# Patient Record
Sex: Male | Born: 1945
Health system: Southern US, Community
[De-identification: ages and names within clinical notes are randomized; demographics above are authoritative.]

## PROBLEM LIST (undated history)

## (undated) DIAGNOSIS — F1020 Alcohol dependence, uncomplicated: Secondary | ICD-10-CM

## (undated) DIAGNOSIS — H269 Unspecified cataract: Secondary | ICD-10-CM

## (undated) DIAGNOSIS — R809 Proteinuria, unspecified: Secondary | ICD-10-CM

## (undated) DIAGNOSIS — I10 Essential (primary) hypertension: Secondary | ICD-10-CM

## (undated) DIAGNOSIS — C61 Malignant neoplasm of prostate: Secondary | ICD-10-CM

## (undated) DIAGNOSIS — E119 Type 2 diabetes mellitus without complications: Secondary | ICD-10-CM

## (undated) DIAGNOSIS — M1990 Unspecified osteoarthritis, unspecified site: Secondary | ICD-10-CM

## (undated) DIAGNOSIS — K219 Gastro-esophageal reflux disease without esophagitis: Secondary | ICD-10-CM

## (undated) DIAGNOSIS — T7840XA Allergy, unspecified, initial encounter: Secondary | ICD-10-CM

## (undated) DIAGNOSIS — N529 Male erectile dysfunction, unspecified: Secondary | ICD-10-CM

## (undated) DIAGNOSIS — IMO0001 Reserved for inherently not codable concepts without codable children: Secondary | ICD-10-CM

## (undated) DIAGNOSIS — F4310 Post-traumatic stress disorder, unspecified: Secondary | ICD-10-CM

## (undated) DIAGNOSIS — Z86010 Personal history of colonic polyps: Secondary | ICD-10-CM

## (undated) DIAGNOSIS — Z8719 Personal history of other diseases of the digestive system: Secondary | ICD-10-CM

## (undated) DIAGNOSIS — Z9889 Other specified postprocedural states: Secondary | ICD-10-CM

## (undated) DIAGNOSIS — E785 Hyperlipidemia, unspecified: Secondary | ICD-10-CM

## (undated) HISTORY — DX: Unspecified cataract: H26.9

## (undated) HISTORY — DX: Malignant neoplasm of prostate: C61

## (undated) HISTORY — DX: Gastro-esophageal reflux disease without esophagitis: K21.9

## (undated) HISTORY — DX: Unspecified osteoarthritis, unspecified site: M19.90

## (undated) HISTORY — DX: Reserved for inherently not codable concepts without codable children: IMO0001

## (undated) HISTORY — DX: Essential (primary) hypertension: I10

## (undated) HISTORY — DX: Allergy, unspecified, initial encounter: T78.40XA

## (undated) HISTORY — DX: Male erectile dysfunction, unspecified: N52.9

## (undated) HISTORY — DX: Hyperlipidemia, unspecified: E78.5

## (undated) HISTORY — DX: Other specified postprocedural states: Z98.890

## (undated) HISTORY — DX: Type 2 diabetes mellitus without complications: E11.9

## (undated) HISTORY — DX: Personal history of colonic polyps: Z86.010

## (undated) HISTORY — DX: Alcohol dependence, uncomplicated: F10.20

## (undated) HISTORY — DX: Post-traumatic stress disorder, unspecified: F43.10

## (undated) HISTORY — DX: Proteinuria, unspecified: R80.9

## (undated) HISTORY — PX: OTHER SURGICAL HISTORY: SHX169

## (undated) HISTORY — DX: Personal history of other diseases of the digestive system: Z87.19

---

## 1999-02-27 ENCOUNTER — Emergency Department (HOSPITAL_COMMUNITY): Admission: EM | Admit: 1999-02-27 | Discharge: 1999-02-27 | Payer: Self-pay | Admitting: Emergency Medicine

## 1999-11-21 ENCOUNTER — Emergency Department (HOSPITAL_COMMUNITY): Admission: EM | Admit: 1999-11-21 | Discharge: 1999-11-21 | Payer: Self-pay | Admitting: Emergency Medicine

## 1999-11-21 ENCOUNTER — Encounter: Payer: Self-pay | Admitting: Emergency Medicine

## 2000-07-08 ENCOUNTER — Emergency Department (HOSPITAL_COMMUNITY): Admission: EM | Admit: 2000-07-08 | Discharge: 2000-07-08 | Payer: Self-pay | Admitting: Emergency Medicine

## 2001-04-27 ENCOUNTER — Emergency Department (HOSPITAL_COMMUNITY): Admission: EM | Admit: 2001-04-27 | Discharge: 2001-04-27 | Payer: Self-pay | Admitting: Emergency Medicine

## 2001-06-29 ENCOUNTER — Encounter: Admission: RE | Admit: 2001-06-29 | Discharge: 2001-07-13 | Payer: Self-pay | Admitting: Occupational Medicine

## 2001-12-15 ENCOUNTER — Encounter: Admission: RE | Admit: 2001-12-15 | Discharge: 2001-12-15 | Payer: Self-pay | Admitting: Neurosurgery

## 2001-12-15 ENCOUNTER — Encounter: Payer: Self-pay | Admitting: Neurosurgery

## 2001-12-29 ENCOUNTER — Encounter: Admission: RE | Admit: 2001-12-29 | Discharge: 2001-12-29 | Payer: Self-pay | Admitting: Neurosurgery

## 2001-12-29 ENCOUNTER — Encounter: Payer: Self-pay | Admitting: Neurosurgery

## 2002-01-27 ENCOUNTER — Ambulatory Visit (HOSPITAL_COMMUNITY): Admission: RE | Admit: 2002-01-27 | Discharge: 2002-01-27 | Payer: Self-pay | Admitting: Gastroenterology

## 2002-02-08 ENCOUNTER — Encounter: Payer: Self-pay | Admitting: Neurosurgery

## 2002-07-26 ENCOUNTER — Encounter: Payer: Self-pay | Admitting: Neurosurgery

## 2002-07-26 ENCOUNTER — Ambulatory Visit (HOSPITAL_COMMUNITY): Admission: RE | Admit: 2002-07-26 | Discharge: 2002-07-26 | Payer: Self-pay | Admitting: Neurosurgery

## 2003-11-27 ENCOUNTER — Emergency Department (HOSPITAL_COMMUNITY): Admission: EM | Admit: 2003-11-27 | Discharge: 2003-11-27 | Payer: Self-pay | Admitting: *Deleted

## 2004-05-03 ENCOUNTER — Ambulatory Visit: Payer: Self-pay | Admitting: Internal Medicine

## 2004-06-26 ENCOUNTER — Ambulatory Visit: Payer: Self-pay | Admitting: Internal Medicine

## 2004-10-31 ENCOUNTER — Emergency Department (HOSPITAL_COMMUNITY): Admission: EM | Admit: 2004-10-31 | Discharge: 2004-10-31 | Payer: Self-pay | Admitting: *Deleted

## 2004-12-19 ENCOUNTER — Ambulatory Visit: Payer: Self-pay | Admitting: Internal Medicine

## 2005-04-04 ENCOUNTER — Ambulatory Visit: Payer: Self-pay | Admitting: Internal Medicine

## 2005-04-18 ENCOUNTER — Ambulatory Visit: Payer: Self-pay | Admitting: Internal Medicine

## 2005-08-06 ENCOUNTER — Ambulatory Visit: Payer: Self-pay | Admitting: Internal Medicine

## 2005-09-06 ENCOUNTER — Ambulatory Visit: Payer: Self-pay | Admitting: Internal Medicine

## 2005-10-07 ENCOUNTER — Emergency Department (HOSPITAL_COMMUNITY): Admission: EM | Admit: 2005-10-07 | Discharge: 2005-10-07 | Payer: Self-pay | Admitting: Emergency Medicine

## 2005-11-21 ENCOUNTER — Ambulatory Visit: Payer: Self-pay | Admitting: Internal Medicine

## 2006-07-15 ENCOUNTER — Ambulatory Visit: Payer: Self-pay | Admitting: Internal Medicine

## 2006-07-15 LAB — CONVERTED CEMR LAB
AST: 44 units/L — ABNORMAL HIGH (ref 0–37)
Albumin: 3.9 g/dL (ref 3.5–5.2)
Alkaline Phosphatase: 47 units/L (ref 39–117)
Bilirubin, Direct: 0.2 mg/dL (ref 0.0–0.3)
CO2: 25 meq/L (ref 19–32)
Chloride: 101 meq/L (ref 96–112)
Eosinophils Absolute: 0 10*3/uL (ref 0.0–0.6)
GFR calc Af Amer: 79 mL/min
GFR calc non Af Amer: 66 mL/min
HCT: 42.4 % (ref 39.0–52.0)
Lymphocytes Relative: 16.8 % (ref 12.0–46.0)
MCV: 89.2 fL (ref 78.0–100.0)
Monocytes Absolute: 0.8 10*3/uL — ABNORMAL HIGH (ref 0.2–0.7)
TSH: 1.29 microintl units/mL (ref 0.35–5.50)
Total Bilirubin: 1 mg/dL (ref 0.3–1.2)
Total CHOL/HDL Ratio: 4.8
Triglycerides: 277 mg/dL (ref 0–149)
VLDL: 55 mg/dL — ABNORMAL HIGH (ref 0–40)

## 2006-09-26 ENCOUNTER — Inpatient Hospital Stay (HOSPITAL_COMMUNITY): Admission: RE | Admit: 2006-09-26 | Discharge: 2006-09-30 | Payer: Self-pay | Admitting: Neurosurgery

## 2006-10-14 ENCOUNTER — Encounter: Payer: Self-pay | Admitting: Internal Medicine

## 2006-10-21 ENCOUNTER — Ambulatory Visit: Payer: Self-pay | Admitting: Internal Medicine

## 2006-10-21 LAB — CONVERTED CEMR LAB
ALT: 21 units/L (ref 0–53)
AST: 28 units/L (ref 0–37)
Albumin: 3.3 g/dL — ABNORMAL LOW (ref 3.5–5.2)
Cholesterol: 164 mg/dL (ref 0–200)
Creatinine,U: 647.2 mg/dL
LDL Cholesterol: 96 mg/dL (ref 0–99)
Microalb Creat Ratio: 154.8 mg/g — ABNORMAL HIGH (ref 0.0–30.0)
Microalb, Ur: 100.2 mg/dL — ABNORMAL HIGH (ref 0.0–1.9)
Total Protein: 6.6 g/dL (ref 6.0–8.3)

## 2006-10-22 ENCOUNTER — Encounter: Payer: Self-pay | Admitting: Internal Medicine

## 2006-10-22 DIAGNOSIS — I10 Essential (primary) hypertension: Secondary | ICD-10-CM

## 2006-10-22 DIAGNOSIS — IMO0002 Reserved for concepts with insufficient information to code with codable children: Secondary | ICD-10-CM | POA: Insufficient documentation

## 2006-10-22 DIAGNOSIS — G4700 Insomnia, unspecified: Secondary | ICD-10-CM | POA: Insufficient documentation

## 2006-10-22 DIAGNOSIS — F528 Other sexual dysfunction not due to a substance or known physiological condition: Secondary | ICD-10-CM

## 2006-10-22 DIAGNOSIS — E785 Hyperlipidemia, unspecified: Secondary | ICD-10-CM | POA: Insufficient documentation

## 2006-11-19 ENCOUNTER — Ambulatory Visit: Payer: Self-pay | Admitting: Internal Medicine

## 2006-11-19 DIAGNOSIS — R809 Proteinuria, unspecified: Secondary | ICD-10-CM | POA: Insufficient documentation

## 2006-11-25 LAB — CONVERTED CEMR LAB
Creatinine,U: 180.6 mg/dL
Microalb Creat Ratio: 369.3 mg/g — ABNORMAL HIGH (ref 0.0–30.0)
Microalb, Ur: 66.7 mg/dL — ABNORMAL HIGH (ref 0.0–1.9)

## 2006-12-16 ENCOUNTER — Encounter: Payer: Self-pay | Admitting: Internal Medicine

## 2007-01-07 ENCOUNTER — Telehealth: Payer: Self-pay | Admitting: Internal Medicine

## 2007-02-16 ENCOUNTER — Ambulatory Visit: Payer: Self-pay | Admitting: Internal Medicine

## 2007-02-18 LAB — CONVERTED CEMR LAB
BUN: 12 mg/dL (ref 6–23)
Chloride: 104 meq/L (ref 96–112)
Creatinine, Ser: 1 mg/dL (ref 0.4–1.5)
GFR calc non Af Amer: 81 mL/min
Glucose, Bld: 84 mg/dL (ref 70–99)
Microalb Creat Ratio: 306.8 mg/g — ABNORMAL HIGH (ref 0.0–30.0)
Microalb, Ur: 20.4 mg/dL — ABNORMAL HIGH (ref 0.0–1.9)

## 2007-02-23 ENCOUNTER — Telehealth: Payer: Self-pay | Admitting: Internal Medicine

## 2007-02-24 ENCOUNTER — Encounter: Payer: Self-pay | Admitting: Internal Medicine

## 2007-03-17 ENCOUNTER — Encounter: Payer: Self-pay | Admitting: Internal Medicine

## 2007-03-18 ENCOUNTER — Telehealth: Payer: Self-pay | Admitting: Internal Medicine

## 2007-06-01 ENCOUNTER — Ambulatory Visit: Payer: Self-pay | Admitting: Internal Medicine

## 2007-06-01 ENCOUNTER — Encounter: Payer: Self-pay | Admitting: Internal Medicine

## 2007-06-03 ENCOUNTER — Telehealth: Payer: Self-pay | Admitting: Internal Medicine

## 2007-06-22 ENCOUNTER — Encounter: Payer: Self-pay | Admitting: Internal Medicine

## 2007-06-29 ENCOUNTER — Ambulatory Visit: Payer: Self-pay | Admitting: Internal Medicine

## 2007-08-07 ENCOUNTER — Telehealth: Payer: Self-pay | Admitting: Internal Medicine

## 2007-08-19 ENCOUNTER — Telehealth: Payer: Self-pay | Admitting: Internal Medicine

## 2007-08-28 ENCOUNTER — Ambulatory Visit: Payer: Self-pay | Admitting: Internal Medicine

## 2007-09-01 ENCOUNTER — Telehealth: Payer: Self-pay | Admitting: Internal Medicine

## 2007-09-15 ENCOUNTER — Encounter: Payer: Self-pay | Admitting: Internal Medicine

## 2007-10-15 ENCOUNTER — Telehealth: Payer: Self-pay | Admitting: Internal Medicine

## 2007-11-16 ENCOUNTER — Telehealth: Payer: Self-pay | Admitting: Internal Medicine

## 2007-11-25 ENCOUNTER — Telehealth: Payer: Self-pay | Admitting: *Deleted

## 2007-12-22 ENCOUNTER — Encounter: Payer: Self-pay | Admitting: Internal Medicine

## 2007-12-29 ENCOUNTER — Telehealth: Payer: Self-pay | Admitting: Internal Medicine

## 2007-12-29 ENCOUNTER — Encounter: Payer: Self-pay | Admitting: Internal Medicine

## 2008-01-15 ENCOUNTER — Telehealth: Payer: Self-pay | Admitting: Internal Medicine

## 2008-01-20 ENCOUNTER — Telehealth: Payer: Self-pay | Admitting: Internal Medicine

## 2008-01-25 ENCOUNTER — Encounter: Payer: Self-pay | Admitting: Internal Medicine

## 2008-03-09 ENCOUNTER — Telehealth: Payer: Self-pay | Admitting: *Deleted

## 2008-03-15 ENCOUNTER — Ambulatory Visit: Payer: Self-pay | Admitting: Internal Medicine

## 2008-03-15 DIAGNOSIS — R0789 Other chest pain: Secondary | ICD-10-CM | POA: Insufficient documentation

## 2008-03-15 HISTORY — PX: NM MYOVIEW LTD: HXRAD82

## 2008-03-17 ENCOUNTER — Encounter: Payer: Self-pay | Admitting: Internal Medicine

## 2008-03-17 ENCOUNTER — Ambulatory Visit: Payer: Self-pay

## 2008-03-17 LAB — CONVERTED CEMR LAB
Albumin: 3.5 g/dL (ref 3.5–5.2)
BUN: 20 mg/dL (ref 6–23)
Basophils Relative: 0.5 % (ref 0.0–3.0)
Calcium: 9 mg/dL (ref 8.4–10.5)
Chloride: 105 meq/L (ref 96–112)
Creatinine,U: 154.8 mg/dL
HCT: 38.3 % — ABNORMAL LOW (ref 39.0–52.0)
HDL: 43.2 mg/dL (ref >39.0)
MCHC: 34.8 g/dL (ref 30.0–36.0)
MCV: 90.5 fL (ref 78.0–100.0)
Microalb Creat Ratio: 34.2 mg/g — ABNORMAL HIGH (ref 0.0–30.0)
Microalb, Ur: 5.3 mg/dL — ABNORMAL HIGH (ref 0.0–1.9)
Monocytes Absolute: 0.6 10*3/uL (ref 0.1–1.0)
Neutro Abs: 3.6 10*3/uL (ref 1.4–7.7)
Potassium: 4.3 meq/L (ref 3.5–5.1)
RBC: 4.24 M/uL (ref 4.22–5.81)
RDW: 14.8 % — ABNORMAL HIGH (ref 11.5–14.6)
Total CHOL/HDL Ratio: 2.8
Triglycerides: 179 mg/dL — ABNORMAL HIGH (ref 0–149)
WBC: 5.6 10*3/uL (ref 4.5–10.5)

## 2008-05-06 ENCOUNTER — Ambulatory Visit: Payer: Self-pay | Admitting: Internal Medicine

## 2008-05-06 ENCOUNTER — Telehealth: Payer: Self-pay | Admitting: Internal Medicine

## 2008-05-06 DIAGNOSIS — R42 Dizziness and giddiness: Secondary | ICD-10-CM

## 2008-05-06 LAB — CONVERTED CEMR LAB: Hemoglobin: 15.4 g/dL

## 2008-05-09 ENCOUNTER — Telehealth: Payer: Self-pay | Admitting: Internal Medicine

## 2008-05-13 ENCOUNTER — Telehealth: Payer: Self-pay | Admitting: *Deleted

## 2008-05-23 ENCOUNTER — Ambulatory Visit: Payer: Self-pay | Admitting: Internal Medicine

## 2008-05-25 ENCOUNTER — Telehealth: Payer: Self-pay | Admitting: Internal Medicine

## 2008-05-26 ENCOUNTER — Ambulatory Visit: Payer: Self-pay | Admitting: Internal Medicine

## 2008-06-06 ENCOUNTER — Ambulatory Visit: Payer: Self-pay | Admitting: Internal Medicine

## 2008-07-04 ENCOUNTER — Ambulatory Visit: Payer: Self-pay | Admitting: Internal Medicine

## 2008-07-06 ENCOUNTER — Encounter: Payer: Self-pay | Admitting: Internal Medicine

## 2008-08-10 ENCOUNTER — Telehealth: Payer: Self-pay | Admitting: Internal Medicine

## 2008-08-29 ENCOUNTER — Ambulatory Visit: Payer: Self-pay | Admitting: Internal Medicine

## 2008-09-01 LAB — CONVERTED CEMR LAB
ALT: 17 units/L (ref 0–53)
Albumin: 3.6 g/dL (ref 3.5–5.2)
Alkaline Phosphatase: 43 units/L (ref 39–117)
Bilirubin, Direct: 0 mg/dL (ref 0.0–0.3)
Calcium: 9.4 mg/dL (ref 8.4–10.5)
GFR calc non Af Amer: 86.95 mL/min (ref >60)
Glucose, Bld: 96 mg/dL (ref 70–99)
Potassium: 4.2 meq/L (ref 3.5–5.1)
Sodium: 141 meq/L (ref 135–145)
TSH: 1.17 microintl units/mL (ref 0.35–5.50)

## 2008-09-05 ENCOUNTER — Telehealth: Payer: Self-pay | Admitting: Internal Medicine

## 2008-09-28 ENCOUNTER — Telehealth: Payer: Self-pay | Admitting: *Deleted

## 2008-10-31 ENCOUNTER — Ambulatory Visit: Payer: Self-pay | Admitting: Internal Medicine

## 2008-10-31 ENCOUNTER — Telehealth: Payer: Self-pay | Admitting: Internal Medicine

## 2008-10-31 DIAGNOSIS — M6530 Trigger finger, unspecified finger: Secondary | ICD-10-CM | POA: Insufficient documentation

## 2008-12-06 ENCOUNTER — Telehealth (INDEPENDENT_AMBULATORY_CARE_PROVIDER_SITE_OTHER): Payer: Self-pay | Admitting: *Deleted

## 2008-12-06 ENCOUNTER — Telehealth: Payer: Self-pay | Admitting: Internal Medicine

## 2009-02-07 ENCOUNTER — Telehealth: Payer: Self-pay | Admitting: Internal Medicine

## 2009-02-08 ENCOUNTER — Ambulatory Visit: Payer: Self-pay | Admitting: Internal Medicine

## 2009-02-08 LAB — CONVERTED CEMR LAB: Blood Glucose, Fingerstick: 158

## 2009-02-22 ENCOUNTER — Telehealth: Payer: Self-pay | Admitting: *Deleted

## 2009-03-29 ENCOUNTER — Telehealth: Payer: Self-pay | Admitting: Internal Medicine

## 2009-03-29 ENCOUNTER — Emergency Department (HOSPITAL_COMMUNITY): Admission: EM | Admit: 2009-03-29 | Discharge: 2009-03-29 | Payer: Self-pay | Admitting: Emergency Medicine

## 2009-04-11 ENCOUNTER — Telehealth: Payer: Self-pay | Admitting: Internal Medicine

## 2009-05-24 ENCOUNTER — Telehealth: Payer: Self-pay | Admitting: *Deleted

## 2009-05-30 ENCOUNTER — Encounter: Payer: Self-pay | Admitting: Internal Medicine

## 2009-06-28 ENCOUNTER — Encounter: Payer: Self-pay | Admitting: Internal Medicine

## 2009-06-29 ENCOUNTER — Ambulatory Visit: Payer: Self-pay | Admitting: Internal Medicine

## 2009-06-29 DIAGNOSIS — Z87891 Personal history of nicotine dependence: Secondary | ICD-10-CM | POA: Insufficient documentation

## 2009-06-29 DIAGNOSIS — R7309 Other abnormal glucose: Secondary | ICD-10-CM

## 2009-07-05 ENCOUNTER — Encounter: Payer: Self-pay | Admitting: Internal Medicine

## 2009-09-07 ENCOUNTER — Ambulatory Visit: Payer: Self-pay | Admitting: Internal Medicine

## 2009-09-07 LAB — CONVERTED CEMR LAB
AST: 24 units/L (ref 0–37)
Albumin: 3.6 g/dL (ref 3.5–5.2)
Alkaline Phosphatase: 46 units/L (ref 39–117)
Eosinophils Relative: 1.5 % (ref 0.0–5.0)
GFR calc non Af Amer: 99.03 mL/min (ref >60)
Glucose, Bld: 93 mg/dL (ref 70–99)
HDL: 31.8 mg/dL — ABNORMAL LOW (ref >39.00)
LDL Cholesterol: 59 mg/dL (ref 0–99)
Lymphocytes Relative: 33.4 % (ref 12.0–46.0)
Lymphs Abs: 2.2 10*3/uL (ref 0.7–4.0)
Monocytes Absolute: 0.7 10*3/uL (ref 0.1–1.0)
Neutrophils Relative %: 54.2 % (ref 43.0–77.0)
Potassium: 4.5 meq/L (ref 3.5–5.1)
RBC: 4.6 M/uL (ref 4.22–5.81)
Sodium: 143 meq/L (ref 135–145)
Specific Gravity, Urine: >=1.03
TSH: 1.31 microintl units/mL (ref 0.35–5.50)
Total Bilirubin: 0.3 mg/dL (ref 0.3–1.2)
Total CHOL/HDL Ratio: 4
Total Protein: 6.6 g/dL (ref 6.0–8.3)
Triglycerides: 169 mg/dL — ABNORMAL HIGH (ref 0.0–149.0)
VLDL: 33.8 mg/dL (ref 0.0–40.0)
WBC Urine, dipstick: NEGATIVE
WBC: 6.5 10*3/uL (ref 4.5–10.5)

## 2009-09-14 ENCOUNTER — Ambulatory Visit: Payer: Self-pay | Admitting: Internal Medicine

## 2009-09-14 DIAGNOSIS — R82998 Other abnormal findings in urine: Secondary | ICD-10-CM

## 2009-09-14 LAB — CONVERTED CEMR LAB: Creatinine,U: 144.7 mg/dL

## 2009-09-21 ENCOUNTER — Telehealth: Payer: Self-pay | Admitting: Internal Medicine

## 2009-10-26 ENCOUNTER — Encounter: Payer: Self-pay | Admitting: Internal Medicine

## 2009-10-30 ENCOUNTER — Encounter: Payer: Self-pay | Admitting: Internal Medicine

## 2009-10-31 ENCOUNTER — Telehealth: Payer: Self-pay | Admitting: *Deleted

## 2009-11-01 ENCOUNTER — Encounter: Payer: Self-pay | Admitting: *Deleted

## 2009-11-06 ENCOUNTER — Telehealth: Payer: Self-pay | Admitting: *Deleted

## 2009-11-07 ENCOUNTER — Telehealth: Payer: Self-pay | Admitting: *Deleted

## 2009-11-16 ENCOUNTER — Ambulatory Visit: Payer: Self-pay | Admitting: Internal Medicine

## 2009-12-21 ENCOUNTER — Encounter: Payer: Self-pay | Admitting: Internal Medicine

## 2009-12-28 ENCOUNTER — Telehealth: Payer: Self-pay | Admitting: *Deleted

## 2009-12-28 ENCOUNTER — Ambulatory Visit: Payer: Self-pay | Admitting: Internal Medicine

## 2009-12-28 LAB — CONVERTED CEMR LAB
ALT: 22 units/L (ref 0–53)
Albumin ELP: 51.8 % — ABNORMAL LOW (ref 55.8–66.1)
Albumin: 3.7 g/dL (ref 3.5–5.2)
Alpha-1-Globulin: 7.1 % — ABNORMAL HIGH (ref 2.9–4.9)
Alpha-2-Globulin: 13.2 % — ABNORMAL HIGH (ref 7.1–11.8)
Beta Globulin: 5.6 % (ref 4.7–7.2)
Bilirubin, Direct: 0.1 mg/dL (ref 0.0–0.3)
Calcium: 9.2 mg/dL (ref 8.4–10.5)
Cholesterol: 125 mg/dL (ref 0–200)
Creatinine, Ser: 1.1 mg/dL (ref 0.4–1.5)
GFR calc non Af Amer: 89.39 mL/min (ref >60)
Gamma Globulin: 18.3 % (ref 11.1–18.8)
Glucose, Bld: 94 mg/dL (ref 70–99)
Potassium: 4.5 meq/L (ref 3.5–5.1)
Total Bilirubin: 0.5 mg/dL (ref 0.3–1.2)
Total CHOL/HDL Ratio: 4
Total Protein, Serum Electrophoresis: 6.9 g/dL (ref 6.0–8.3)
Total Protein: 6.6 g/dL (ref 6.0–8.3)
VLDL: 23.4 mg/dL (ref 0.0–40.0)

## 2010-01-11 ENCOUNTER — Ambulatory Visit: Payer: Self-pay | Admitting: Internal Medicine

## 2010-01-11 DIAGNOSIS — E669 Obesity, unspecified: Secondary | ICD-10-CM | POA: Insufficient documentation

## 2010-01-11 DIAGNOSIS — M19049 Primary osteoarthritis, unspecified hand: Secondary | ICD-10-CM | POA: Insufficient documentation

## 2010-02-07 ENCOUNTER — Telehealth: Payer: Self-pay | Admitting: Internal Medicine

## 2010-02-13 ENCOUNTER — Telehealth: Payer: Self-pay | Admitting: *Deleted

## 2010-05-15 NOTE — Letter (Signed)
Summary: [facility]   Imported By: Laural Benes 07/07/2009 13:01:20  _____________________________________________________________________  External Attachment:    Type:   Image     Comment:   External Document

## 2010-05-15 NOTE — Progress Notes (Signed)
Summary: refill on zolpidem  Phone Note From Pharmacy   Caller: [facility]. [phone]* Reason for Call: Needs renewal Details for Reason: zolpidem 10mg  Summary of Call: last filled on 02/08/09 #30 Initial call taken by: Sherron Monday, [place] (AAMA),  December 28, 2009 11:07 AM  Follow-up for Phone Call        ok x 1  Follow-up by: [name] MD,  December 28, 2009 11:29 AM  Additional Follow-up for Phone Call Additional follow up Details #1::        Faxed to pharmacy Additional Follow-up by: Sherron Monday, CMA (AAMA),  December 28, 2009 11:31 AM    Prescriptions: ZOLPIDEM TARTRATE 10 MG TABS (ZOLPIDEM TARTRATE) 1 by mouth hs for sleep  #14 x 0   Entered by:   Sherron Monday, CMA (AAMA)   Authorized by:   [name] MD   Signed by:   Sherron Monday, CMA (AAMA) on 12/28/2009   Method used:   Handwritten   [id number]

## 2010-05-15 NOTE — Letter (Signed)
Summary: [facility]   Imported By: Laural Benes 01/10/2010 09:18:11  _____________________________________________________________________  External Attachment:    Type:   Image     Comment:   External Document

## 2010-05-15 NOTE — Letter (Signed)
Summary: [facility]   Imported By: Laural Benes 07/05/2009 13:47:11  _____________________________________________________________________  External Attachment:    Type:   Image     Comment:   External Document

## 2010-05-15 NOTE — Progress Notes (Signed)
Summary: Lipitor follow-up concern  Phone Note Call from Patient Call back at Home Phone [phone]   Caller: Patient Call For: [name] MD Summary of Call: VM from pt in follow-up to his questions about Lipitor and the instructions, he states he never heard back regarding this. Requesting Larene [place] call back Initial call taken by: Nira Conn LPN,  July 25, 624THL [id number] PM  Follow-up for Phone Call        Pt aware and will pick up samples. Follow-up by: Sherron Monday, CMA [name]),  November 06, 2009 3:31 PM

## 2010-05-15 NOTE — Assessment & Plan Note (Signed)
Summary: FU AFTER [facility]ISIT & DR PETERSON WORKUP THAT SHOWED PROTEIN IN...   Vital Signs:  Patient profile:   65 year old male Weight:      225.5 pounds Pulse rate:   60 / minute BP sitting:   120 / 74  (left arm) Cuff size:   large  Vitals Entered By: Sherron Monday, CMA (AAMA) (November 16, 2009 10:03 AM)  CC: Follow-up visit , Hypertension Management   History of Present Illness: Jeremy Black comes in today  for follow up of multiple medical problems . since last visit we have change his lipid med because of potential of  for drug IA with simva   but prob no se . Also  proteinuria ::   saw urology instead of renal   referral  but   see note and no  structural disease .   Marland Kitchen   no edema and feels well otherwise . 24 hour urine for protein was  1.5 gram range  Only taking meloxicam ocassionally .   for back joint pain.  Bp: running diastolic 80...and systolic [id number] + [id number]  range at times .   Psych  not seeing because  but doing better .    Goes to [place] as needed.  still tobacco and alcohol free.   plays golf . No cv pulm restrictions.   Hypertension History:      He denies headache, chest pain, palpitations, dyspnea with exertion, orthopnea, PND, peripheral edema, visual symptoms, neurologic problems, syncope, and side effects from treatment.  He notes no problems with any antihypertensive medication side effects.        Positive major cardiovascular risk factors include male age 36 years old or older, hyperlipidemia, and hypertension.  Negative major cardiovascular risk factors include no history of diabetes and non-tobacco-user status.     Preventive Screening-Counseling & Management  Alcohol-Tobacco     Alcohol drinks/day: 0     Smoking Status: quit     Packs/Day: 1pack every 7 days     Year Quit: 2008     Tobacco Counseling: not to resume use of tobacco products  Caffeine-Diet-Exercise     Caffeine use/day: 1     Does Patient Exercise: yes     Type of exercise: golf,  aerobic     Exercise (avg: min/session): 30-60     Times/week: 2  Current Medications (verified): 1)  Triamterene-Hctz 37.5-25 Mg Tabs (Triamterene-Hctz) .... Take 1 Tablet By Mouth Once A Day 2)  Verapamil Hcl Cr 240 Mg Tbcr (Verapamil Hcl) .... Take 1 Tablet By Mouth Once A Day 3)  Baby Aspirin 81 Mg  Chew (Aspirin) 4)  Centrum Silver   Tabs (Multiple Vitamins-Minerals) 5)  Cialis 20 Mg  Tabs (Tadalafil) .Marland Kitchen.. 1 By Mouth As Needed 6)  Garlic   Powd (Garlic) 7)  Losartan Potassium 100 Mg Tabs (Losartan Potassium) .... Take 1 Tablet By Mouth Once A Day 8)  Mobic 15 Mg Tabs (Meloxicam) .Marland Kitchen.. 1 By Mouth Once Daily 9)  Bystolic 10 Mg Tabs (Nebivolol Hcl) .... Take Daily As Directed- 1/2 Tab- Out For A Few Days 10)  Zolpidem Tartrate 10 Mg Tabs (Zolpidem Tartrate) .Marland Kitchen.. 1 By Mouth Hs For Sleep 11)  Lipitor 20 Mg Tabs (Atorvastatin Calcium) .Marland Kitchen.. 1 By Mouth Once Daily  Allergies (verified): 1)  Codeine Phosphate (Codeine Phosphate)  Past History:  Past medical, surgical, family and social histories (including risk factors) reviewed, and no changes noted (except as noted below).  Past  Medical History: Hyperlipidemia Hypertension ED review of past record showed 500 mg protein excretion per 24 hours on a 24 hour urine  felt to be from hypertension COlon ? 2003  Myoview 12/09 neg except for HT ( all meds held) Td ? utd  Tx for PTSD  [facility]  [place] experience  [place]      Past Surgical History: Reviewed history from 06/01/2007 and no changes required. LS Spinal Surgery x2   Past History:  Care Management: [facility]surgery: [facility] Orthopedics: In past on [facility] of name  Family History: Reviewed history from 02/08/2009 and no changes required. CAD sister Father: dead form cancer  throat    65 years ago  Mother:   DM   HBP  nerves   Siblings: SIs   dm   diet controlled  ,  heart   .   Brother HBP        Social History: Reviewed  history from 09/14/2009 and no changes required. 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         right groin pain poss from  back.     ocassional  Physical Exam  General:  Well-developed,well-nourished,in no acute distress; alert,appropriate and cooperative throughout examinationhealthy-appearing and good hygiene.   Head:  normocephalic and atraumatic.   Neck:  No deformities, masses, or tenderness noted. Lungs:  normal respiratory effort and no intercostal retractions.   Heart:  normal rate, regular rhythm, no murmur, no JVD, and no lifts.   Pulses:  pulses intact without delay   Extremities:  no clubbing cyanosis or edema  Neurologic:  non focal  Skin:  turgor normal and color normal.   Cervical Nodes:  No lymphadenopathy noted Psych:  Oriented X3, normally interactive, good eye contact, not anxious appearing, and not depressed appearing.     Impression & Recommendations:  Problem # 1:  PROTEINURIA (ICD-791.0)  evaluate for intrinsic renal disease   and disc BP controll and other  poss interventions.    creatinine had been stable in the 1.0 range .  will refer to nephrology. as Advised recently .   disc with patient.  Orders: Nephrology Referral (Nephro)  Problem # 2:  HYPERTENSION (ICD-401.9)  somewhat up     will try  micardis instead of losaartan to see if get to 130 range   in the meant time . up at home in 140 range    His updated medication list for this problem includes:    Triamterene-hctz 37.5-25 Mg Tabs (Triamterene-hctz) .Marland Kitchen... Take 1 tablet by mouth once a  day    Verapamil Hcl Cr 240 Mg Tbcr (Verapamil hcl) .Marland Kitchen... Take 1 tablet by mouth once a day    Losartan Potassium 100 Mg Tabs (Losartan potassium) .Marland Kitchen... Take 1 tablet by mouth once a day    Bystolic 10 Mg Tabs (Nebivolol hcl) .Marland Kitchen... Take daily as directed- 1/2 tab- out for a few days    Micardis 80 Mg Tabs (Telmisartan) .Marland Kitchen... 1 by mouth once daily  BP today: 120/74 Prior BP: 140/80 (09/14/2009)  10 Yr Risk Heart Disease: 9 %  Prior 10 Yr Risk Heart Disease: 14 % (09/14/2009)  Labs Reviewed: K+: 4.5 (09/07/2009) Creat: : 1.0 (09/07/2009)   Chol: 125 (09/07/2009)   HDL: 31.80 (09/07/2009)   LDL: 59 (09/07/2009)   TG: 169.0 (09/07/2009)  Orders: Nephrology Referral (Nephro)  Problem # 3:  HYPERLIPIDEMIA (ICD-272.4)  change meds because of drug interactions  and   will recheck in   4-6 weeks no se of med  The following medications were removed from the medication list:    Simvastatin 40 Mg Tabs (Simvastatin) .Marland Kitchen... 1 by mouth once daily His updated medication list for this problem includes:    Lipitor 20 Mg Tabs (Atorvastatin calcium) .Marland Kitchen... 1 by mouth once daily  Labs Reviewed: SGOT: 24 (09/07/2009)   SGPT: 21 (09/07/2009)  10 Yr Risk Heart Disease: 9 % Prior 10 Yr Risk Heart Disease: 14 % (09/14/2009)   HDL:31.80 (09/07/2009), 43.2 (03/15/2008)  LDL:59 (09/07/2009), 40 (03/15/2008)  Chol:125 (09/07/2009), 119 (03/15/2008)  Trig:169.0 (09/07/2009), 179 (03/15/2008)  Problem # 4:  ERECTILE DYSFUNCTION (ICD-302.72) samples given  His updated medication list for this problem includes:    Cialis 20 Mg Tabs (Tadalafil) .Marland Kitchen... 1 by mouth as needed  Problem # 5:  TOBACCO USE, QUIT (ICD-V15.82) continue tobacco free  doing well   Complete Medication List: 1)  Triamterene-hctz 37.5-25 Mg Tabs (Triamterene-hctz) .... Take 1 tablet by mouth once a day 2)  Verapamil Hcl Cr 240 Mg Tbcr (Verapamil hcl) .... Take 1 tablet by mouth once a day 3)  Baby Aspirin 81 Mg Chew (Aspirin) 4)  Centrum  Silver Tabs (Multiple vitamins-minerals) 5)  Cialis 20 Mg Tabs (Tadalafil) .Marland Kitchen.. 1 by mouth as needed 6)  Garlic Powd (Garlic) 7)  Losartan Potassium 100 Mg Tabs (Losartan potassium) .... Take 1 tablet by mouth once a day 8)  Mobic 15 Mg Tabs (Meloxicam) .Marland Kitchen.. 1 by mouth once daily 9)  Bystolic 10 Mg Tabs (Nebivolol hcl) .... Take daily as directed- 1/2 tab- out for a few days 10)  Zolpidem Tartrate 10 Mg Tabs (Zolpidem tartrate) .Marland Kitchen.. 1 by mouth hs for sleep 11)  Lipitor 20 Mg Tabs (Atorvastatin calcium) .Marland Kitchen.. 1 by mouth once daily 12)  Micardis 80 Mg Tabs (Telmisartan) .Marland Kitchen.. 1 by mouth once daily  Hypertension Assessment/Plan:      The patient's hypertensive risk group is category B: At least one risk factor (excluding diabetes) with no target organ damage.  His calculated 10 year risk of coronary heart disease is 9 %.  Today's blood pressure is 120/74.  His blood pressure goal is < 140/90.  Patient Instructions: 1)  will set up    nephrology appt.  2)   contin ue on liptor .   3)  try  micardis instead of losaartan  for BP   4)  check LIPIDs LFTS and BMP in 4-6 weeks and then ROV

## 2010-05-15 NOTE — Progress Notes (Signed)
Summary: lipitor question  Phone Note Call from Patient Call back at Home Phone [phone]   Caller: Patient Call For: [name] MD Reason for Call: Refill Medication Details for Reason: refill Summary of Call: patient is calling because he is not sure if he should take 1 tab or half tab of is lipitor 20mg  and he will need a refill sent to [facility]. Initial call taken by: Westley Hummer CMA [name]),  November 07, 2009 2:43 PM  Follow-up for Phone Call        Pt aware. Follow-up by: Sherron Monday, CMA (AAMA),  November 07, 2009 4:03 PM    New/Updated Medications: LIPITOR 20 MG TABS (ATORVASTATIN CALCIUM) 1 by mouth once daily Prescriptions: LIPITOR 20 MG TABS (ATORVASTATIN CALCIUM) 1 by mouth once daily  #30 x 0   Entered by:   Sherron Monday, CMA (AAMA)   Authorized by:   [name] MD   Signed by:   Sherron Monday, CMA (AAMA) on 11/07/2009   Method used:   Electronically to        [facility] [phone]* (retail)       [address]       [place], [place]  [address]       Ph: [phone] or [phone]       Fax: [phone]   RxID:   [id number]

## 2010-05-15 NOTE — Letter (Signed)
Summary: Generic Letter  [facility]   [place], [place] [address]   Phone: [phone]  Fax: [phone]    11/01/2009  JACARION PRIMAVERA [address] [place], [place]  [address]  Dear Mr. Nepomuceno,  We tried to call you about about having samples of lipitor ready for you at our office but were unable to leave you a message on your machine. Also, you need to schedule lipids and liver test done in 6-8 weeks after starting this medication with a follow up with Dr. Regis Bill one week later. So you will need to schedule these appointments as well.         Sincerely,   Paul Half, CMA (AAMA)  Appended Document: Generic Letter Says somebody called him from office.  Will be by to pick up the lipitor samples.  Read note to him & he will also receive in mail.

## 2010-05-15 NOTE — Assessment & Plan Note (Signed)
Summary: 2 month rov/njr pt rsc/njr   Vital Signs:  Patient profile:   65 year old male Weight:      229 pounds Pulse rate:   60 / minute BP sitting:   140 / 80  (left arm) Cuff size:   large  Vitals Entered By: Sherron Monday, CMA (AAMA) (September 14, 2009 10:46 AM)  Serial Vital Signs/Assessments:  Time      Position  BP       Pulse  Resp  Temp     By           R Arm     140/70                         [name] MD           L Arm     138/72                         [name] MD  Comments: sitting By: [name] MD   CC: Follow-up visit on labs, Hypertension Management, Pt wants to discuss taking simvastin with verapamil. Pt has been having shoulder pain and pains going down rt leg. But that went away with mobic.   History of Present Illness: Jeremy Black comes in today  for follow up of multiple medical problems  since last visit has begun the simvastatin and no se seen but did have pain in right groin hip area  that resolved when took mobic.  also righ shoulder pain at times. No diffuse myalgias slow pusle syncope or dizziness. Acxtually feels well but has a Sweet tooth and drinks sweet tea and sodas and has gained weight since stopping tobacco and alcohol.   Is fairly active and no othe limitation.  Hypertension History:      He denies headache, chest pain, palpitations, dyspnea with exertion, orthopnea, PND, peripheral edema, visual symptoms, neurologic problems, syncope, and side effects from treatment.  He notes no problems with any antihypertensive medication side effects.        Positive major cardiovascular risk factors include male age 42 years old or older, hyperlipidemia, and hypertension.  Negative major cardiovascular risk factors include no history of diabetes and non-tobacco-user status.     Preventive Screening-Counseling & Management  Alcohol-Tobacco     Alcohol drinks/day: 0     Smoking Status: quit     Packs/Day: 1pack every 7 days     Year  Quit: 2008  Caffeine-Diet-Exercise     Caffeine use/day: 1     Does Patient Exercise: yes     Type of exercise: golf, aerobic     Exercise (avg: min/session): 30-60     Times/week: 2  Safety-Violence-Falls     Seat Belt Use: yes  Current Medications (verified): 1)  Triamterene-Hctz 37.5-25 Mg Tabs (Triamterene-Hctz) .... Take 1 Tablet By Mouth Once A Day 2)  Verapamil Hcl Cr 240 Mg Tbcr (Verapamil Hcl) .... Take 1 Tablet By Mouth Once A Day 3)  Baby Aspirin 81 Mg  Chew (Aspirin) 4)  Centrum Silver   Tabs (Multiple Vitamins-Minerals) 5)  Simvastatin 40 Mg  Tabs (Simvastatin) .Marland Kitchen.. 1 By Mouth Once Daily 6)  Cialis 20 Mg  Tabs (Tadalafil) .Marland Kitchen.. 1 By Mouth As Needed 7)  Garlic   Powd (Garlic) 8)  Wellbutrin Sr 150 Mg Xr12h-Tab (Bupropion Hcl) .Marland Kitchen.. 1 By Mouth Two Times A Day 9)  Losartan Potassium 100 Mg Tabs (Losartan Potassium) .... Take 1 Tablet By Mouth Once A Day 10)  Mobic 15 Mg Tabs (Meloxicam) .Marland Kitchen.. 1 By Mouth Once Daily 11)  Bystolic 10 Mg Tabs (Nebivolol Hcl) .... Take Daily As Directed- 1/2 Tab- Out For A Few Days 12)  Zolpidem Tartrate 10 Mg Tabs (Zolpidem Tartrate) .Marland Kitchen.. 1 By Mouth Hs For Sleep  Allergies (verified): 1)  Codeine Phosphate (Codeine Phosphate)  Past History:  Past medical, surgical, family and social histories (including risk factors) reviewed, and no changes noted (except as noted below).  Past Medical History: Reviewed history from 05/26/2008 and no changes required. Hyperlipidemia Hypertension ED review of past record showed 500 mg protein excretion per 24 hours on a 24 hour urine  felt to be from hypertension COlon ? 2003  Myoview 12/09 neg except for HT ( all meds held) Td ? utd  [facility]      Past Surgical History: Reviewed history from 06/01/2007 and no changes required. LS Spinal Surgery x2   Past History:  Care Management: [facility]surgery: [facility]opedics: In past on [facility] of name  Family  History: Reviewed history from 02/08/2009 and no changes required. CAD sister Father: dead form cancer  throat    52 years ago  Mother:   DM   HBP  nerves   Siblings: SIs   dm   diet controlled  ,  heart   .   Brother HBP        Social History: Reviewed history from 06/29/2009 and no changes required. 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    Physical Exam  General:  alert, well-developed, and well-nourished.   in nad  Head:  normocephalic and atraumatic.   Eyes:  PERRL, EOMs full, conjunctiva clear  Mouth:  dentures Neck:  No deformities, masses, or tenderness noted. Lungs:  Normal respiratory effort, chest expands symmetrically. Lungs are clear to auscultation, no crackles or wheezes. Heart:  Normal rate and regular rhythm. S1 and S2 normal without gallop, murmur, click, rub or other extra sounds. Msk:  gait nl nl hip and motor strength le  Pulses:  pulses intact without delay   Extremities:  no clubbing cyanosis or edema  Neurologic:  alert & oriented X3, strength normal in all extremities, and gait normal.  cn3-12 seems intact Skin:  turgor normal, color normal, no ecchymoses, and no petechiae.   Cervical Nodes:  No lymphadenopathy noted Psych:  Oriented X3, normally interactive, good eye contact, not anxious appearing, and not depressed appearing.     Impression & Recommendations:  Problem # 1:  HYPERLIPIDEMIA (P102836.4) Assessment Improved  no obv clinical interaction but candrop the dose on verapamil   . Will follow  I think the groin pain  may have been MS cause and to follow up if recurring  His updated medication list for this problem includes:    Simvastatin 40 Mg Tabs (Simvastatin) .Marland Kitchen... 1 by mouth once daily  Labs Reviewed: SGOT: 24 (09/07/2009)   SGPT: 21 (09/07/2009)  10 Yr Risk Heart Disease: 14 % Prior 10 Yr Risk  Heart Disease: 18 % (02/08/2009)   HDL:31.80 (09/07/2009), 43.2 (03/15/2008)  LDL:59 (09/07/2009), 40 (03/15/2008)  Chol:125 (09/07/2009), 119 (03/15/2008)  Trig:169.0 (09/07/2009), 179 (03/15/2008)  Problem # 2:  HYPERTENSION (ICD-401.9) needs to be better  is actually taking equivalent of 5 mg bystolic and no se .   weight gain and lifestyle could be a factor  but  if protein in urine consider further   intervention eval. caution with mobic use The following medications were removed from the medication list:    Bystolic 10 Mg Tabs (Nebivolol hcl) .Marland Kitchen... 1 by mouth once daily His updated medication list for this problem includes:    Triamterene-hctz 37.5-25 Mg Tabs (Triamterene-hctz) .Marland Kitchen... Take 1 tablet by mouth once a day    Verapamil Hcl Cr 240 Mg Tbcr (Verapamil hcl) .Marland Kitchen... Take 1 tablet by mouth once a day    Losartan Potassium 100 Mg Tabs (Losartan potassium) .Marland Kitchen... Take 1 tablet by mouth once a day    Bystolic 10 Mg Tabs (Nebivolol hcl) .Marland Kitchen... Take daily as directed- 1/2 tab- out for a few days  Problem # 3:  HYPERGLYCEMIA, MILD (ICD-790.29) hg a1c is now 6.6      stop eating so many sweets !  pt  agrees  Problem # 4:  ERECTILE DYSFUNCTION (ICD-302.72) sample and rx of cialis His updated medication list for this problem includes:    Cialis 20 Mg Tabs (Tadalafil) .Marland Kitchen... 1 by mouth as needed  Problem # 5:  PROTEINURIA (ICD-791.0) repeating  today    Problem # 6:  DEGENERATION, DISC NOS (ICD-722.6) stable   Problem # 7:  obesity  counseled   Complete Medication List: 1)  Triamterene-hctz 37.5-25 Mg Tabs (Triamterene-hctz) .... Take 1 tablet by mouth once a day 2)  Verapamil Hcl Cr 240 Mg Tbcr  (Verapamil hcl) .... Take 1 tablet by mouth once a day 3)  Baby Aspirin 81 Mg Chew (Aspirin) 4)  Centrum Silver Tabs (Multiple vitamins-minerals) 5)  Simvastatin 40 Mg Tabs (Simvastatin) .Marland Kitchen.. 1 by mouth once daily 6)  Cialis 20 Mg Tabs (Tadalafil) .Marland Kitchen.. 1 by mouth as needed 7)  Garlic Powd (Garlic) 8)  Wellbutrin Sr 150 Mg Xr12h-tab (Bupropion hcl) .Marland Kitchen.. 1 by mouth two times a day 9)  Losartan Potassium 100 Mg Tabs (Losartan potassium) .... Take 1 tablet by mouth once a day 10)  Mobic 15 Mg Tabs (Meloxicam) .Marland Kitchen.. 1 by mouth once daily 11)  Bystolic 10 Mg Tabs (Nebivolol hcl) .... Take daily as directed- 1/2 tab- out for a few days 12)  Zolpidem Tartrate 10 Mg Tabs (Zolpidem tartrate) .Marland Kitchen.. 1 by mouth hs for sleep  Other Orders: TLB-Microalbumin/Creat Ratio, Urine (82043-MALB)  Hypertension Assessment/Plan:      The patient's hypertensive risk group is category B: At least one risk factor (excluding diabetes) with no target organ damage.  His calculated 10 year risk of coronary heart disease is 14 %.  Today's blood pressure is 140/80.  His blood pressure goal is < 140/90.  Patient Instructions: 1)  can decrease the simvastatin to 20mg   2)  limit sweets  and sugars    including drinks .  3)  Your numbers show pre diabetes.   4)  lose weight  and limit sugars should help  the blood sugar and blood pressure and joint pains. 5)  If we still have   protein in urine  .      we  can do   renal referral.   6)  then plan  follow up  7)  rov in 3 months or as needed.  Prescriptions: CIALIS 20 MG  TABS (TADALAFIL) 1 by mouth as needed  #3 x 2   Entered and Authorized by:   [name] MD   Signed by:   [name] MD on 09/14/2009   Method used:   Print then Give to Patient   RxID:   [id number]

## 2010-05-15 NOTE — Consult Note (Signed)
Summary: [facility]   Imported By: Laural Benes 11/01/2009 14:53:14  _____________________________________________________________________  External Attachment:    Type:   Image     Comment:   External Document

## 2010-05-15 NOTE — Progress Notes (Signed)
Summary: lipitor problem  Phone Note Call from Patient Call back at Home Phone [phone]   Caller: vm Summary of Call: Problem with thumbs hurting, one like a trigger finger.  Have cut back on & think it's the Lipitor I'm taking.  Advice?   Initial call taken by: Shelbie Hutching, RN,  February 07, 2010 2:44 PM  Follow-up for Phone Call        i dont think it s the lipitor he can try off and then on  to see if  related but this is not a typical se and not the same as myalgias . Keep [place] informed  Follow-up by: [name] MD,  February 07, 2010 5:18 PM  Additional Follow-up for Phone Call Additional follow up Details #1::        lEFT MESSAGE TO INFORM.   Additional Follow-up by: Shelbie Hutching, RN,  February 07, 2010 5:27 PM

## 2010-05-15 NOTE — Assessment & Plan Note (Signed)
Summary: 2 month rov/njr---PT [facility] // RS   Vital Signs:  Patient profile:   65 year old male Weight:      222 pounds BMI:     31.52 Pulse rate:   60 / minute BP sitting:   126 / 80  (left arm) Cuff size:   large  Vitals Entered By: Sherron Monday, CMA (AAMA) (January 11, 2010 8:16 AM)  Nutrition Counseling: Patient's BMI is greater than 25 and therefore counseled on weight management options.  Serial Vital Signs/Assessments:  Time      Position  BP       Pulse  Resp  Temp     By                     126/80                         [name] MD  CC: Follow-up visit on labs, Hypertension Management, soreness on in the muscle in the thumbs on both hands and little  finger on rt hand, pt is also having soreness on rt side.   History of Present Illness: Jeremy Black comes in today  for follow up of multiple medical problems  Since last visit he has seen Dr Moshe Cipro nephrology concerning the proteinuria. No other change in health status . Also have changed from lsimvastatin to lipitor because of potential interactions on his medications.   LIPIDS:  no se of liiptir no myalgias . has some  joint issues but thinks this is his djd.  HT :readings up and down some am 158 and others 120    unsure cause .   To follow up witth readings Dr Ronald Lobo. MS: Hands thumb areas sore.  no swelling or redness. Weight: no  a lot of exercise  .  plans to.  Sleep: interrupted some from lving with his mom .  Hypertension History:      He complains of side effects from treatment, but denies headache, chest pain, palpitations, dyspnea with exertion, orthopnea, PND, peripheral edema, visual symptoms, neurologic problems, and syncope.  He notes the following problems with antihypertensive medication side effects: muscle pain in thumbs and little finger on rt hand.        Positive major cardiovascular risk factors include male age 67 years old or older, hyperlipidemia, and hypertension.   Negative major cardiovascular risk factors include no history of diabetes and non-tobacco-user status.     Preventive Screening-Counseling & Management  Alcohol-Tobacco     Alcohol drinks/day: 0     Smoking Status: quit     Packs/Day: 1pack every 7 days     Year Quit: 2008     Tobacco Counseling: not to resume use of tobacco products  Caffeine-Diet-Exercise     Caffeine use/day: 1     Does Patient Exercise: yes     Type of exercise: golf, aerobic     Exercise (avg: min/session): 30-60     Times/week: 2  Current Medications (verified): 1)  Triamterene-Hctz 37.5-25 Mg Tabs (Triamterene-Hctz) .... Take 1 Tablet By Mouth Once A Day 2)  Verapamil Hcl Cr 240 Mg Tbcr (Verapamil Hcl) .... Take 1 Tablet By Mouth Once A Day 3)  Baby Aspirin 81 Mg  Chew (Aspirin) 4)  Centrum Silver   Tabs (Multiple Vitamins-Minerals) 5)  Cialis 20 Mg  Tabs (Tadalafil) .Marland Kitchen.. 1 By Mouth As Needed 6)  Garlic   Powd (Garlic) 7)  Mobic  15 Mg Tabs (Meloxicam) .Marland Kitchen.. 1 By Mouth Once Daily 8)  Bystolic 10 Mg Tabs (Nebivolol Hcl) .... Take Daily As Directed- 1/2 Tab- Out For A Few Days 9)  Zolpidem Tartrate 10 Mg Tabs (Zolpidem Tartrate) .Marland Kitchen.. 1 By Mouth Hs For Sleep 10)  Lipitor 20 Mg Tabs (Atorvastatin Calcium) .Marland Kitchen.. 1 By Mouth Once Daily 11)  Micardis 80 Mg Tabs (Telmisartan) .Marland Kitchen.. 1 By Mouth Once Daily  Allergies (verified): 1)  Codeine Phosphate (Codeine Phosphate)  Past History:  Past medical, surgical, family and social histories (including risk factors) reviewed, and no changes noted (except as noted below).  Past Medical History: Reviewed history from 11/16/2009 and no changes required. Hyperlipidemia Hypertension ED review of past record showed 500 mg protein excretion per 24 hours on a 24 hour urine  felt to be from hypertension [place] ? 2003  Myoview 12/09 neg except for HT ( all meds held) Td ? utd  Tx for PTSD  [facility]  [place] experience  [place]      Past Surgical  History: Reviewed history from 06/01/2007 and no changes required. LS Spinal Surgery x2   Past History:  Care Management: Neurosurgery: [facility] Orthopedics: In past on [facility] of name Urology: [facility] nephrology   Family History: Reviewed history from 02/08/2009 and no changes required. CAD sister Father: dead form cancer  throat    35 years ago  Mother:   DM   HBP  nerves   Siblings: SIs   dm   diet controlled  ,  heart   .   Brother HBP        Social History: Reviewed history from 11/16/2009 and no changes required. 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 [employer] working  6 hours per days  Former Smoker alcohol stopped  Mom getting some dementia  Review of Systems  The patient denies anorexia, fever, chest pain, syncope, dyspnea on exertion, peripheral edema, abdominal pain, melena, hematuria, transient blindness, difficulty walking, depression, enlarged lymph nodes, and angioedema.    Physical Exam  General:  Well-developed,well-nourished,in no acute distress; alert,appropriate and cooperative throughout examination Head:  normocephalic and atraumatic.   Eyes:  vision grossly intact.   Neck:  No deformities, masses, or tenderness noted. Lungs:  normal respiratory effort and no intercostal retractions.   Heart:  normal rate and regular rhythm.   see BP readings  Msk:  hand nl but some tenderness at base fo thumb good grip and rom.   Pulses:  no clubbing cyanosis or edema  Neurologic:  grossly non focal  Skin:  turgor normal and color normal.   Cervical Nodes:  no anterior cervical adenopathy.   Psych:  Oriented X3, normally interactive, good eye contact, not anxious appearing, and not depressed appearing.   labs reviewed and disc about hypertension managmen  Impression & Recommendations:  Problem #  1:  HYPERTENSION (ICD-401.9) up and down  unclear control.    good readings today.  The following medications were removed from the medication list:    Losartan Potassium 100 Mg Tabs (Losartan potassium) .Marland Kitchen... Take 1 tablet by mouth once a day His updated medication list for this problem includes:    Triamterene-hctz 37.5-25 Mg Tabs (Triamterene-hctz) .Marland Kitchen... Take 1 tablet by mouth once a day    Verapamil Hcl Cr 240  Mg Tbcr (Verapamil hcl) .Marland Kitchen... Take 1 tablet by mouth once a day    Bystolic 10 Mg Tabs (Nebivolol hcl) .Marland Kitchen... Take daily as directed- 1/2 tab- out for a few days    Micardis 80 Mg Tabs (Telmisartan) .Marland Kitchen... 1 by mouth once daily  BP today: 126/80 Prior BP: 120/74 (11/16/2009)  Prior 10 Yr Risk Heart Disease: 9 % (11/16/2009)  Labs Reviewed: K+: 4.5 (12/28/2009) Creat: : 1.1 (12/28/2009)   Chol: 125 (12/28/2009)   HDL: 30.80 (12/28/2009)   LDL: 71 (12/28/2009)   TG: 117.0 (12/28/2009)  Problem # 2:  PROTEINURIA (ICD-791.0) felt to be   from  ht etc.   following per dr Moshe Cipro.    Problem # 3:  HYPERLIPIDEMIA (ICD-272.4) now on lipitor  His updated medication list for this problem includes:    Lipitor 20 Mg Tabs (Atorvastatin calcium) .Marland Kitchen... 1 by mouth once daily  Labs Reviewed: SGOT: 25 (12/28/2009)   SGPT: 22 (12/28/2009)  Prior 10 Yr Risk Heart Disease: 9 % (11/16/2009)   HDL:30.80 (12/28/2009), 31.80 (09/07/2009)  LDL:71 (12/28/2009), 59 (09/07/2009)  Chol:125 (12/28/2009), 125 (09/07/2009)  Trig:117.0 (12/28/2009), 169.0 (09/07/2009)  Problem # 4:  OSTEOARTHRITIS, HAND (ICD-715.94) probable and dont think this is from lipitor .  disc use tylenol and minimal use of nsaid .    disc strategies.  His updated medication list for this problem includes:    Baby Aspirin 81 Mg Chew (Aspirin)    Mobic 15 Mg Tabs (Meloxicam) .Marland Kitchen... 1 by mouth once daily  Problem # 5:  HYPERGLYCEMIA, MILD (ICD-790.29) Assessment: Improved  Labs Reviewed: Creat: 1.1 (12/28/2009)      Problem # 6:  INSOMNIA (ICD-780.52) lives with mom and gets interrupted sleep  His updated medication list for this problem includes:    Zolpidem Tartrate 10 Mg Tabs (Zolpidem tartrate) .Marland Kitchen... 1 by mouth hs for sleep  Problem # 7:  ERECTILE DYSFUNCTION (ICD-302.72) samples of cialis given  His updated medication list for this problem includes:    Cialis 20 Mg Tabs (Tadalafil) .Marland Kitchen... 1 by mouth as needed  Problem # 8:  OBESITY (ICD-278.00) some weight loss will help .  Complete Medication List: 1)  Triamterene-hctz 37.5-25 Mg Tabs (Triamterene-hctz) .... Take 1 tablet by mouth once a day 2)  Verapamil Hcl Cr 240 Mg Tbcr (Verapamil hcl) .... Take 1 tablet by mouth once a day 3)  Baby Aspirin 81 Mg Chew (Aspirin) 4)  Centrum Silver Tabs (Multiple vitamins-minerals) 5)  Cialis 20 Mg Tabs (Tadalafil) .Marland Kitchen.. 1 by mouth as needed 6)  Garlic Powd (Garlic) 7)  Mobic 15 Mg Tabs (Meloxicam) .Marland Kitchen.. 1 by mouth once daily 8)  Bystolic 10 Mg Tabs (Nebivolol hcl) .... Take daily as directed- 1/2 tab- out for a few days 9)  Zolpidem Tartrate 10 Mg Tabs (Zolpidem tartrate) .Marland Kitchen.. 1 by mouth hs for sleep 10)  Lipitor 20 Mg Tabs (Atorvastatin calcium) .Marland Kitchen.. 1 by mouth once daily 11)  Micardis 80 Mg Tabs (Telmisartan) .Marland Kitchen.. 1 by mouth once daily  Other Orders: Admin 1st Vaccine [id number]) Flu Vaccine 14yrs + [id number])  Hypertension Assessment/Plan:      The patient's hypertensive risk group is category B: At least one risk factor (excluding diabetes) with no target organ damage.  His calculated 10 year risk of coronary heart disease is 9 %.  Today's blood pressure is 126/80.  His blood pressure goal is < 140/90. Flu Vaccine Consent Questions     Do you have a history of severe allergic reactions  to this vaccine? no    Any prior history of allergic reactions to egg and/or gelatin? no    Do you have a sensitivity to the preservative Thimersol? no    Do you have a past history of Guillan-Barre Syndrome? no    Do  you currently have an acute febrile illness? no    Have you ever had a severe reaction to latex? no    Vaccine information given and explained to patient? yes    Are you currently pregnant? no    Lot Number:[id number]   Exp Date:10/13/2010   Site Given  Left Deltoid IM Sherron Monday, CMA (AAMA)  January 11, 2010 8:25 AM   Patient Instructions: 1)  continue checking   bp readings  2-3  at a time.  2)  losing weight  will help your  blood pressure .  3)  walking is good.  4)  rov in 6 months or as needed .   Prescriptions: MICARDIS 80 MG TABS (TELMISARTAN) 1 by mouth once daily  #30 x 12   Entered and Authorized by:   [name] MD   Signed by:   [name] MD on 01/11/2010   Method used:   Electronically to        [facility]. [phone]* (retail)       [address]       [place], [place]  [address]       Ph: [phone] or [phone]       Fax: [phone]   [id number]      .lbflu

## 2010-05-15 NOTE — Progress Notes (Signed)
Summary: Samples of micardis  Phone Note Call from Patient Call back at Home Phone [phone]   Caller: Patient---triage vm Summary of Call: His nephrologist has changed his Verapamil starting tomorrow. he cannot remember the name.  He is back on Lipitor, too., regarding his thumb issue. Please return call. Initial call taken by: Despina Arias,  February 13, 2010 3:39 PM  Follow-up for Phone Call        [place] Follow-up by: Sherron Monday, [place] [name]),  February 13, 2010 4:39 PM  Additional Follow-up for Phone Call Additional follow up Details #1::        [facility] Additional Follow-up by: Sherron Monday, [place] [name]),  February 15, 2010 11:32 AM    Additional Follow-up for Phone Call Additional follow up Details #2::    [facility] Sherron Monday, [place] [name])  February 19, 2010 12:52 PM Spoke to pt- PT needs a rx for micaris samples and he did  go back on lipitor. Pt still has popping in rt thumb and is taking mobic. [facility].   Follow-up by: Sherron Monday, CMA [name]),  February 22, 2010 10:06 AM  Additional Follow-up for Phone Call Additional follow up Details #3:: Details for Additional Follow-up Action Taken: ok if we have some Additional Follow-up by: [name] MD,  February 22, 2010 12:27 PM    Left message on machine that samples are up front. Sherron Monday, CMA [name])  February 22, 2010 12:32 PM

## 2010-05-15 NOTE — Progress Notes (Signed)
Summary: strept throat exposure  Phone Note Call from Patient   Caller: Patient Call For: Jeremy Medin MD Reason for Call: Lab or Test Results Summary of Call: [facility]) Pt has been exposed to strept throat from his grandchild and wants an antibiotic. [phone] Initial call taken by: Deanna Artis CMA,  September 21, 2009 2:30 PM  Follow-up for Phone Call        tell patient   no reason to treat  without a symptoms . he should call if gets sorethroat fever and swollen gland . Also  his urine test still shows protein in it. May we do a renal referral. Follow-up by: Jeremy Medin MD,  September 22, 2009 1:24 PM  Additional Follow-up for Phone Call Additional follow up Details #1::        [facility] Additional Follow-up by: Deanna Artis CMA,  September 22, 2009 1:35 PM  New Problems: PROTEINURIA (ICD-791.0)   Additional Follow-up for Phone Call Additional follow up Details #2::    Spoke to pt and he would like to have the [place] referral......Marland Kitchenhe needs this on a Tuesday or a Thursday. Follow-up by: Deanna Artis CMA,  September 25, 2009 8:58 AM  Additional Follow-up for Phone Call Additional follow up Details #3:: Details for Additional Follow-up Action Taken: Faxed order to [facility]. Additional Follow-up by: Carlene Coria,  September 25, 2009 2:48 PM  New Problems: PROTEINURIA (ICD-791.0)

## 2010-05-15 NOTE — Progress Notes (Signed)
Summary: lipitor ?  Phone Note Call from Patient Call back at Home Phone [phone]   Caller: vm Reason for Call: Talk to Doctor Summary of Call: Dr. Terance Hart sending mail about my kidney.  He did [place] & saw no infection kidneys & bladder, no swelling, did see polyps that he said was nothing.  Did 24hr ua for protein. They told him that the protein in his urine is high & they would recommend nephrologist.  He will get them to send report also to [place] his new PCP there 7-28 pm & will talk to PCP about nephrologist there.  Will see Dr. Mamie Nick August 8-4.  Would like to hear from Dr. Mamie Nick about the protein in his urine when she gets Dr. Elam Dutch report.  Shelbie Hutching, RN  October 31, 2009 11:56 AM      Initial call taken by: Shelbie Hutching, RN,  October 31, 2009 10:42 AM  Follow-up for Phone Call        1.  He should see a renal doctor  ( nephrologist )   That was my initial intention but good that he had the ruo work up.   SWe can refer in town >  Is this ok?  2.  He mentioned this at last visit and now since theer is a CI to taking  zocor and verapamil  I agree tio try  change his  lipid medication .    (There is a copay card for 4 $ with lipitor but he can discuss this with  the [place] doc also . and then follow up )  Follow-up by: [name] MD,  October 31, 2009 12:23 PM  Additional Follow-up for Phone Call Additional follow up Details #1::        Patient given your message.  Do you want him to go on lipitor instead of simvastatin?  He is going to talk to [facility] about a nephrologist.   Additional Follow-up by: Shelbie Hutching, RN,  October 31, 2009 2:41 PM    Additional Follow-up for Phone Call Additional follow up Details #2::    ok   Lipitor 20 mg per day   samples and rx disp 30 refill x 3  check lipid and lfts in 6-8 weeks  and then plan follow up.  Follow-up by: [name] MD,  October 31, 2009 5:06 PM  Additional Follow-up for Phone Call Additional  follow up Details #3:: Details for Additional Follow-up Action Taken: Tried to call pt but couldn't leave a message. I mailed pt a letter about samples ready for pt at office and that he needs to have labs done in 6-8 weeks then a follow up a week after labs are done. Additional Follow-up by: Sherron Monday, CMA (AAMA),  November 01, 2009 11:00 AM

## 2010-05-15 NOTE — Assessment & Plan Note (Signed)
Summary: HTN // RS   Vital Signs:  Patient profile:   65 year old male Weight:      227 pounds BMI:     32.23 O2 Sat:      98 % Temp:     98.4 degrees F oral Pulse rate:   59 / minute Pulse rhythm:   regular Resp:     12 per minute BP sitting:   132 / 78  Vitals Entered By: Deanna Artis CMA (June 29, 2009 8:14 AM)  Serial Vital Signs/Assessments:  Time      Position  BP       Pulse  Resp  Temp     By                     128/78                         [name] MD  Comments: large cuff sitting  By: [name] MD   CC: bp check Is Patient Diabetic? No Pain Assessment Patient in pain? no        History of Present Illness: Jeremy Black comesin  for follow up of multiple medical problems . Since last visit  he has done fairly well with no tobacco or alcohol and feels much better . Has diability from [employer] for ptsd and doing well. Now working 2 PT jobs  [id number] to   job  health evaluation and noted to have bp 160/98   ? had run out of bystolic and restarted the coreg only once a day .   IN the last 2 days  went back to 2 verapamil per day   .  LIPIDs: on no meds but found tg 210 and low hdl  on lipid screen adn BG 122 but wasnt fasting.   No tobacco or alcohol.    has some snacking with sunflower seeds and chips .    Less exercise recently.  Sleep  no worsening. ED   request cialis sample. DJD: No worsening ok to play golf if not too much    Preventive Screening-Counseling & Management  Alcohol-Tobacco     Alcohol drinks/day: 0     Smoking Status: quit  Current Medications (verified): 1)  Triamterene-Hctz 37.5-25 Mg Tabs (Triamterene-Hctz) .... Take 1 Tablet By Mouth Once A Day 2)  Verapamil Hcl Cr 240 Mg Tbcr (Verapamil Hcl) .... Take 1 Tablet By Mouth Once A Day 3)  Baby Aspirin 81 Mg  Chew (Aspirin) 4)  Centrum Silver   Tabs (Multiple Vitamins-Minerals) 5)  Simvastatin 40 Mg  Tabs (Simvastatin) .Marland Kitchen.. 1 By Mouth Once Daily 6)  Cialis 20 Mg   Tabs (Tadalafil) .Marland Kitchen.. 1 By Mouth As Needed 7)  Ambien 10 Mg  Tabs (Zolpidem Tartrate) .Marland Kitchen.. 1 By Mouth Once Daily 8)  Bystolic 10 Mg Tabs (Nebivolol Hcl) .Marland Kitchen.. 1 By Mouth Once Daily 9)  Garlic   Powd (Garlic) 10)  Wellbutrin Sr 150 Mg Xr12h-Tab (Bupropion Hcl) .Marland Kitchen.. 1 By Mouth Two Times A Day 11)  Losartan Potassium 100 Mg Tabs (Losartan Potassium) .... Take 1 Tablet By Mouth Once A Day 12)  Mobic 15 Mg Tabs (Meloxicam) .Marland Kitchen.. 1 By Mouth Once Daily 13)  Bystolic 10 Mg Tabs (Nebivolol Hcl) .... Take Daily As Directed 14)  Zolpidem Tartrate 10 Mg Tabs (Zolpidem Tartrate) .Marland Kitchen.. 1 By Mouth Hs For Sleep  Allergies (verified): 1)  Codeine Phosphate (Codeine Phosphate)  Past  History:  Past medical, surgical, family and social histories (including risk factors) reviewed, and no changes noted (except as noted below).  Past Medical History: Reviewed history from 05/26/2008 and no changes required. Hyperlipidemia Hypertension ED review of past record showed 500 mg protein excretion per 24 hours on a 24 hour urine  felt to be from hypertension [place] ? 2003  Myoview 12/09 neg except for HT ( all meds held) Td ? utd  [place]      Past Surgical History: Reviewed history from 06/01/2007 and no changes required. LS Spinal Surgery x2   Family History: Reviewed history from 02/08/2009 and no changes required. CAD sister Father: dead form cancer  throat    39 years ago  Mother:   DM   HBP  nerves   Siblings: SIs   dm   diet controlled  ,  heart   .   Brother HBP        Social History: Reviewed history from 02/08/2009 and no changes required. 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    Physical Exam  General:  alert, well-developed, and well-nourished.   Head:  normocephalic and atraumatic.   Eyes:  vision grossly intact and pupils equal.   Neck:  No deformities, masses, or tenderness noted. Lungs:  Normal respiratory effort, chest expands symmetrically. Lungs are clear to auscultation, no crackles or wheezes. Heart:  Normal rate and regular rhythm. S1 and S2 normal without gallop, murmur, click, rub or other extra sounds. Abdomen:  Bowel sounds positive,abdomen soft and non-tender without masses, organomegaly or   noted. Pulses:  pulses intact without delay   Extremities:  no clubbing cyanosis or edema  Neurologic:  cranial nerves II-XII intact, strength normal in all extremities, and gait normal.   Skin:  turgor normal, color normal, no ecchymoses, and no petechiae.   Cervical Nodes:  No lymphadenopathy noted Psych:  Oriented X3, normally interactive, good eye contact, not anxious appearing, and not depressed appearing.     Impression & Recommendations:  Problem # 1:  HYPERTENSION (ICD-401.9) ran out of bystolic  and started once daily coreg and then tood 2 verapamil   go back to prev dosing  ( taking 5 bystolic and did  sample.  His updated medication list for this problem includes:    Triamterene-hctz 37.5-25 Mg Tabs (Triamterene-hctz) .Marland Kitchen... Take 1 tablet by mouth once a day    Verapamil Hcl Cr 240 Mg Tbcr (Verapamil hcl) .Marland Kitchen... Take 1 tablet by mouth once a day    Bystolic 10 Mg Tabs (Nebivolol hcl) .Marland Kitchen... 1 by mouth once daily    Losartan Potassium 100 Mg Tabs (Losartan potassium) .Marland Kitchen... Take 1 tablet by mouth once a day    Bystolic 10 Mg Tabs (Nebivolol hcl) .Marland Kitchen... Take daily as  directed  Problem # 2:  HYPERLIPIDEMIA (ICD-272.4) restart meds  His updated medication list for this problem includes:    Simvastatin 40 Mg Tabs (Simvastatin) .Marland Kitchen... 1 by mouth once daily  Labs Reviewed: SGOT: 23 (08/29/2008)  SGPT: 17 (08/29/2008)  Prior 10 Yr Risk Heart Disease: 18 % (02/08/2009)   HDL:43.2 (03/15/2008), 49.6 (10/21/2006)  LDL:40 (03/15/2008), 96 (10/21/2006)  Chol:119 (03/15/2008), 164 (10/21/2006)  Trig:179 (03/15/2008), 90 (10/21/2006)  Problem # 3:  ERECTILE DYSFUNCTION (ICD-302.72) sample given   His updated medication list for this problem includes:    Cialis 20 Mg Tabs (Tadalafil) .Marland Kitchen... 1 by mouth as needed  Problem # 4:  DEGENERATION, DISC NOS (ICD-722.6) Assessment: Unchanged no change   Problem # 5:  HYPERGLYCEMIA, MILD (ICD-790.29) on screen but really wasnt fasting but check a1c with next labs.     Problem # 6:  Preventive Health Care (ICD-V70.0) continue   healthy lifestyle intervention  and decrease salt . doing  much better .    counseled about    Complete Medication List: 1)  Triamterene-hctz 37.5-25 Mg Tabs (Triamterene-hctz) .... Take 1 tablet by mouth once a day 2)  Verapamil Hcl Cr 240 Mg Tbcr (Verapamil hcl) .... Take 1 tablet by mouth once a day 3)  Baby Aspirin 81 Mg Chew (Aspirin) 4)  Centrum Silver Tabs (Multiple vitamins-minerals) 5)  Simvastatin 40 Mg Tabs (Simvastatin) .Marland Kitchen.. 1 by mouth once daily 6)  Cialis 20 Mg Tabs (Tadalafil) .Marland Kitchen.. 1 by mouth as needed 7)  Ambien 10 Mg Tabs (Zolpidem tartrate) .Marland Kitchen.. 1 by mouth once daily 8)  Bystolic 10 Mg Tabs (Nebivolol hcl) .Marland Kitchen.. 1 by mouth once daily 9)  Garlic Powd (Garlic) 10)  Wellbutrin Sr 150 Mg Xr12h-tab (Bupropion hcl) .Marland Kitchen.. 1 by mouth two times a day 11)  Losartan Potassium 100 Mg Tabs (Losartan potassium) .... Take 1 tablet by mouth once a day 12)  Mobic 15 Mg Tabs (Meloxicam) .Marland Kitchen.. 1 by mouth once daily 13)  Bystolic 10 Mg Tabs (Nebivolol hcl) .... Take daily as directed 14)   Zolpidem Tartrate 10 Mg Tabs (Zolpidem tartrate) .Marland Kitchen.. 1 by mouth hs for sleep  Patient Instructions: 1)  decrease  salt in your diet . 2)  continue   Bp medications as directed.   3)  restart  simvastatin   1 by mouth once daily  4)  In 6-8 weeks  dofasting labs  .    LIPIDs, LFTS,   BMP, Hg a1c , TSH , PSA, CBCdiff, 5)  Dx 401.9,  272.4 ,  v70 hyperglycemia 6)  Then rov to review.

## 2010-05-15 NOTE — Progress Notes (Signed)
Summary: refill  Phone Note Call from Patient Call back at Home Phone [phone]   Caller: Patient Summary of Call: Pt needs a refill on losartin refill [facility]. Initial call taken by: Sherron Monday, CMA (AAMA),  May 24, 2009 1:40 PM  Follow-up for Phone Call        Pt aware and rx sent to pharmacy. Follow-up by: Sherron Monday, CMA (AAMA),  May 24, 2009 1:41 PM    New/Updated Medications: LOSARTAN POTASSIUM 100 MG TABS (LOSARTAN POTASSIUM) Take 1 tablet by mouth once a day Prescriptions: LOSARTAN POTASSIUM 100 MG TABS (LOSARTAN POTASSIUM) Take 1 tablet by mouth once a day  #30 x 3   Entered by:   Sherron Monday, CMA (AAMA)   Authorized by:   [name] MD   Signed by:   Sherron Monday, CMA (AAMA) on 05/24/2009   Method used:   Electronically to        [facility]. [phone]* (retail)       [address]       [place], [place]  [address]       Ph: [phone] or [phone]       Fax: [phone]   RxID:   [id number]

## 2010-05-15 NOTE — Letter (Signed)
Summary: Health Screening Results  Health Screening Results   Imported By: Laural Benes 07/05/2009 14:59:16  _____________________________________________________________________  External Attachment:    Type:   Image     Comment:   External Document

## 2010-06-25 ENCOUNTER — Telehealth: Payer: Self-pay | Admitting: *Deleted

## 2010-06-25 NOTE — Telephone Encounter (Signed)
Patient requests that Larene Beach give him a call

## 2010-06-26 NOTE — Telephone Encounter (Signed)
Ok if we have some   Ask if he is getting any of his meds from the [place]

## 2010-06-26 NOTE — Telephone Encounter (Signed)
Samples up front. Left message on machine about this and to call us back to let us know if he is getting his medications from the [place].

## 2010-06-26 NOTE — Telephone Encounter (Signed)
Please triage message.

## 2010-06-26 NOTE — Telephone Encounter (Signed)
Pt. Needs samples of Bystolic.

## 2010-07-06 ENCOUNTER — Encounter: Payer: Self-pay | Admitting: Internal Medicine

## 2010-07-12 ENCOUNTER — Ambulatory Visit (INDEPENDENT_AMBULATORY_CARE_PROVIDER_SITE_OTHER): Payer: BC Managed Care – PPO | Admitting: Internal Medicine

## 2010-07-12 ENCOUNTER — Encounter: Payer: Self-pay | Admitting: Internal Medicine

## 2010-07-12 DIAGNOSIS — M6530 Trigger finger, unspecified finger: Secondary | ICD-10-CM

## 2010-07-12 DIAGNOSIS — R7309 Other abnormal glucose: Secondary | ICD-10-CM

## 2010-07-12 DIAGNOSIS — Z87891 Personal history of nicotine dependence: Secondary | ICD-10-CM

## 2010-07-12 DIAGNOSIS — E785 Hyperlipidemia, unspecified: Secondary | ICD-10-CM

## 2010-07-12 DIAGNOSIS — I10 Essential (primary) hypertension: Secondary | ICD-10-CM

## 2010-07-12 DIAGNOSIS — R809 Proteinuria, unspecified: Secondary | ICD-10-CM

## 2010-07-12 NOTE — Patient Instructions (Signed)
Call if Bp readings are  high over 140 or so on a regular basis  Or as needed

## 2010-07-12 NOTE — Assessment & Plan Note (Signed)
Continued tobacco and alcohol free

## 2010-07-12 NOTE — Assessment & Plan Note (Signed)
Improved both palms unclear why recheck as needed.

## 2010-07-12 NOTE — Progress Notes (Signed)
  Subjective:    Patient ID: Jeremy Black, male    DOB: 1946/01/28, 65 y.o.   MRN: [id number]  HPI Patient comes in today for routine six-month followup for his multiple medical problems. Since his last visit  His wife recently died of gastric cancer .  He is doing as well as can be expected. He has lost some weight with lifestyle issues. He is no longer on the verapamil but is on the myocardis. This was given to him by the renal doctor.    He is working off and on no swelling or pressure he thinks is okay.  Would like samples of Cialis if possible. No change in his degenerative joint disease. He sees his psychiatrist and is having some problems with sleep trying to manage with sleep hygiene. He is not currently drinking alcohol.  Review of Systems Trigger thumbs fingers are better for whatever reason they went away. No shortness of breath he has some right-sided twinges with some pain radiating down his right arm at times. Occasional cough no wheezing or shortness   itchy eyes  some nasal drainage  Past Medical History  Diagnosis Date  . Hyperlipidemia   . Hypertension   . ED (erectile dysfunction)   . Proteinuria     past record showed 500mg  protein excretion per 24 hours eval by renal  . PTSD (post-traumatic stress disorder)     from [place] experience [place]  . Normal nuclear stress test     myoview neg 12 09 except for HT   Past Surgical History  Procedure Date  . Ls spinal surgery   . [place]m myoview ltd 12/09    neg except for HT    reports that he has quit smoking. He does not have any smokeless tobacco history on file. He reports that he does not drink alcohol or use illicit drugs. family history includes Coronary artery disease in his sister; Diabetes in his mother and sister; Heart disease in his sister; Hypertension in his brother and mother; Other in his mother; and Throat cancer in his father. Allergies  Allergen Reactions  . Codeine Phosphate     REACTION: unspecified        Objective:   Physical Exam Well-developed well-nourished in no acute distress. HEENT normocephalic TMs clear eyes clear except for mild redness in the right upper lid without discharge. Nares slight clear discharge face nontender OP clear neck supple without masses or adenopathy Chest clear to auscultation and percussion cardiovascular S1-S2 no gallops or murmurs pulse is 60 Negative CCE. Blood pressure repeat are normal right 122/80 left 134/78.     Assessment & Plan:   Hypertension elevated reading but normal on repeat. Will continue same medication ED  No samples available of cialis  Given 20 mg Disp 2  levitra  Possible allergies without history of same use antihistamines without decongestant to try and warm compresses to the right eye. Call if progressive symptoms. Hyperlipidemia  We'll plan followup in 6 months was full set of labs and check up. Call meantime if he has uncontrolled hypertension or if any symptoms are progressive.

## 2010-07-12 NOTE — Assessment & Plan Note (Signed)
Following no intervention at present except blood pressure control.

## 2010-07-17 LAB — COMPREHENSIVE METABOLIC PANEL
Alkaline Phosphatase: 58 U/L (ref 39–117)
BUN: 14 mg/dL (ref 6–23)
CO2: 23 mEq/L (ref 19–32)
GFR calc non Af Amer: >60 mL/min (ref >60)
Glucose, Bld: 107 mg/dL — ABNORMAL HIGH (ref 70–99)
Potassium: 3.7 mEq/L (ref 3.5–5.1)
Total Protein: 6.7 g/dL (ref 6.0–8.3)

## 2010-07-17 LAB — DIFFERENTIAL
Basophils Absolute: 0.1 10*3/uL (ref 0.0–0.1)
Basophils Relative: 1 % (ref 0–1)
Eosinophils Absolute: 0 10*3/uL (ref 0.0–0.7)
Monocytes Relative: 8 % (ref 3–12)
Neutro Abs: 4 10*3/uL (ref 1.7–7.7)
Neutrophils Relative %: 60 % (ref 43–77)

## 2010-07-17 LAB — POCT CARDIAC MARKERS
Myoglobin, poc: 142 ng/mL (ref 12–200)
Troponin i, poc: <0.05 ng/mL (ref 0.00–0.09)

## 2010-07-17 LAB — CBC
HCT: 41.1 % (ref 39.0–52.0)
MCHC: 34.5 g/dL (ref 30.0–36.0)

## 2010-07-17 LAB — D-DIMER, QUANTITATIVE: D-Dimer, Quant: 0.84 ug/mL-FEU — ABNORMAL HIGH (ref 0.00–0.48)

## 2010-08-02 ENCOUNTER — Other Ambulatory Visit: Payer: Self-pay | Admitting: Internal Medicine

## 2010-08-28 NOTE — Discharge Summary (Signed)
NAME:  Jeremy Black, Jeremy Black NO.:  [id number]   MEDICAL RECORD NO.:  [id number]          PATIENT TYPE:  INP   LOCATION:  3008                         FACILITY:  [facility]   PHYSICIAN:  Elizabeth Sauer, M.D.      DATE OF BIRTH:  1946-02-02   DATE OF ADMISSION:  09/26/2006  DATE OF DISCHARGE:  09/30/2006                               DISCHARGE SUMMARY   ADMISSION DIAGNOSIS:  Spondylosis in disk L4-5.   DISCHARGE DIAGNOSIS:  Spondylosis in disk L4-5.   PROCEDURE:  L4-5 laminectomy, diskectomy, post lumbar body fusion, and  post lateral arthrodesis.   BODY:  This is a 65 year old gentleman whose history and physical is  reencountered in the chart.  He has had lumbar spondylosis for a long  time.  Has a disk at L4-5.  He was admitted after ascertaining abnormal  laboratory values and underwent L4-5 fusion as noted above.  Postoperatively, he has done well.  On first post-op day, Foley was  removed.  Second postop day PCA was stopped.  Third postop day he was up  and walking about and now he is ready for discharge.  The incision is  dry and well-healing.  His strength is full.  He is capable with his  ADLs being discharged home.  He is being discharged on Percocet.  His  followup will be in the [place] offices next week.           ______________________________  Elizabeth Sauer, M.D.     MWR/MEDQ  D:  09/30/2006  T:  09/30/2006  Job:  [id number]

## 2010-08-28 NOTE — Op Note (Signed)
NAMEULRICH, CHAU                ACCOUNT NO.:  [id number]   MEDICAL RECORD NO.:  [id number]          PATIENT TYPE:  INP   LOCATION:  2899                         FACILITY:  [facility]   PHYSICIAN:  Ashok Pall, M.D.     DATE OF BIRTH:  1945-07-06   DATE OF PROCEDURE:  09/26/2006  DATE OF DISCHARGE:                               OPERATIVE REPORT   PREOPERATIVE DIAGNOSIS:  1. Recurrent displaced disc right L4-L5  2. Degenerative disc disease L4-L5.  3. Spondylosis without myelopathy L4-L5.  4. Radiculopathy.   POSTOPERATIVE DIAGNOSES:  1. Recurrent displaced disc right L4-L5  2. Degenerative disc disease L4-L5.  3. Spondylosis without myelopathy L4-L5.  4. Radiculopathy.   PROCEDURE:  1. Posterior lumbar interbody arthrodesis L4-L5 with 13 mm PEEK      implants filled with morselized autograft.  2. Posterolateral arthrodesis L4-L5 with morselized autograft.  3. Nonsegmental pedicle screw fixation L4, 6.5 x 50, on the right side      at L5, 5.5 by 45, on the left side at L5, 6.5 x 45.  4. Decompression beyond what was necessary for PLIF of the L4-L5 nerve      roots bilaterally.   SURGEON:  Ashok Pall, M.D.   ASSISTANT:  Elizabeth Sauer, M.D.   ANESTHESIA:  General endotracheal.   INDICATIONS:  Mr. Rudenko is a gentleman who has lived with lumbar  stenosis, recurrent displaced disk, and spondylitic change in the lumbar  spine.  He has severe degenerative disc disease at L4-L5 and a great  deal of compression of both the nerve roots.  Facet arthropathy is also  bad.  I therefore recommended and he agreed to undergo operative  decompression and fixation.   OPERATIVE NOTE:  Mr. Nitzel was brought to the operating room, intubated,  and placed under a general anesthetic without difficulty.  He had a  Foley catheter placed under sterile conditions.  He was rolled prone  onto the operating table with body rolls and all pressure points were  properly padded.  His back was prepped and  he was draped in a sterile  fashion.  I infiltrated 20 mL 0.5% lidocaine with 1:200,000 epinephrine  into the lumbar region.  I opened the skin with a #10 blade and took  this down to the thoracolumbar fascia.  I then exposed the lamina of L3,  L4, and L5.  My first x-ray showed that I was in the L3-L4 interlaminar  space.  I moved down one level and took another x-ray showing that I was  at the L4-L5 interlaminar space.  I then proceeded to expose the  transverse processes bilaterally of L4 and L5.  I placed self-retaining  retractors and then proceeded with the posterior lumbar interbody  arthrodesis.   I performed a near complete laminectomy of L4 and also remove some  portion of L5 to fully decompress the thecal sac.  I then performed very  aggressive foraminotomies removing the inferior facet of L4 bilaterally  while on the left side and partial facetectomy on the right side.  I  removed a great deal  of the superior facet of L5 overlying the disc  space and overlying the neural foramen.  Both L4 roots were then very  well decompressed bilaterally.  The L5 nerve roots were also very well  decompressed bilaterally.  I then turned my attention to the disc space.  I retracted the thecal sac medially, first on the left side, and I  opened the disc space with a #15 blade.  I removed the disc material in  a progressive fashion using various curets and pituitary rongeurs along  with Kerrison punches.  I then turned my attention to the right side  where I did the same procedure.  There, however, I worked through some  dense scar tissue but I used sharp dissection so as to not have to tug  at things.  I opened the disc space.  I placed a distractor on the left  side in order to facilitate the discectomy on the right side.  I did  that using, again, various instruments to clean the disc space and  prepare the endplates for arthrodesis.  I sized the disc space to be  able to accommodate a 13  PEEK implant.  Then, using the autograft after  it was morselize, the cages were packed.  I then placed two cages at L4-  L5.  Fluoroscopy then showed that the cages were in very good position.  I now turned my attention to the posterolateral arthrodesis.   I performed the posterolateral arthrodesis by decorticating the  transverse processes of L4 and L5 bilaterally and placing morselized  autograft in the lateral gutters.  I then placed the pedicle screws  using fluoroscopic guidance, two screws at L4, two screws at L5, first  by drilling and using a pedicle probe and checking the probe hole for  any cut outs which I did not appreciate, and then I tapped using a 5/5  tap and appreciated no cutouts.  I placed two 15 mm screws at the L4  level.  I placed a 5.5 on the right side and 6.5 on the left side, both  45 mm at L5.  With Dr. Rex Kras help, we connected the rods to the screws  and then locked locking screws on those.  I took an x-ray and this  showed that the screws were in good position.   I then closed wound in layered fashion using Vicryl sutures after  irrigating copiously.  Dermabond was used for a sterile dressing.  I  reapproximate the thoracolumbar fascia, subcutaneous and subcuticular  layers.  Mr. Flewellen tolerated the procedure well.           ______________________________  Ashok Pall, M.D.     KC/MEDQ  D:  09/26/2006  T:  09/27/2006  Job:  [id number]

## 2010-09-15 ENCOUNTER — Other Ambulatory Visit: Payer: Self-pay | Admitting: Internal Medicine

## 2010-09-25 ENCOUNTER — Telehealth: Payer: Self-pay | Admitting: *Deleted

## 2010-09-25 NOTE — Telephone Encounter (Signed)
Norvasc 5 mg, micardis, triamterene/hctz, and Lipitor sent to [facility]. Left message for pt to call back to let us know if he wants [place] to send these to [facility] and is he taking Norvasc because this is not on his medication list.

## 2010-09-28 MED ORDER — TRIAMTERENE-HCTZ 37.5-25 MG PO TABS: 1.0000 | ORAL_TABLET | Freq: Every day | ORAL | Status: DC

## 2010-09-28 MED ORDER — TELMISARTAN 80 MG PO TABS: 80.0000 mg | ORAL_TABLET | Freq: Every day | ORAL | Status: DC

## 2010-09-28 MED ORDER — ATORVASTATIN CALCIUM 20 MG PO TABS: 20.0000 mg | ORAL_TABLET | Freq: Every day | ORAL | Status: DC

## 2010-09-28 NOTE — Telephone Encounter (Signed)
Spoke with pt- He states to refill the others and not to worry about the norvasc. Rx sent to pharmacy

## 2010-11-16 ENCOUNTER — Telehealth: Payer: Self-pay | Admitting: Internal Medicine

## 2010-11-16 NOTE — Telephone Encounter (Signed)
Pt called in did not want to set appt. Pt said when he eats he has an issue with food getting caught in throat and can not belch. Pt is requesting contact.

## 2010-11-16 NOTE — Telephone Encounter (Signed)
Pt. Complaining of his food getting hung in throat everytime he eats.  Has been going x several months,  Most times he has to spit the food or medication out when he can't swallow it.   Needs to know whether to see Dr. Regis Bill or Specialist?

## 2010-11-27 ENCOUNTER — Telehealth: Payer: Self-pay | Admitting: *Deleted

## 2010-11-27 NOTE — Telephone Encounter (Signed)
Need to see Gi asap  . If cant get in then see me first .  In the meantime  Can take prilosec every day .

## 2010-11-27 NOTE — Telephone Encounter (Signed)
See above

## 2010-11-27 NOTE — Telephone Encounter (Addendum)
Pt is feeling better, and thinks he was eating too fast.  If he decides to see [facility], [facility] to [facility].  Regarding his dysphagia, and previous phone note.  See 11/16/2010 phone note.

## 2010-12-25 ENCOUNTER — Telehealth: Payer: Self-pay | Admitting: *Deleted

## 2010-12-25 NOTE — Telephone Encounter (Signed)
Pt needs samples of bystolic 10mg . I gave pt 1 month of samples.

## 2010-12-31 ENCOUNTER — Telehealth: Payer: Self-pay | Admitting: *Deleted

## 2010-12-31 NOTE — Telephone Encounter (Signed)
Pt is having severe hip pain, and called Dr. Hewitt Shorts office and made appt for 01/07/2011.  He asked for pain meds from that office but was told to call Dr. Regis Bill for his pain. He states he has had surgery through this office, and he was advised to ask Dr. Hewitt Shorts office why they cannot prescribe meds since Dr. Regis Bill has not treated him for this problem.  He insists we ask Dr. Regis Bill also.

## 2010-12-31 NOTE — Telephone Encounter (Signed)
Unclear if this is his back or hip . Best care would be to have treating doctor rx meds. If this is severe he needs appt earlier with specialist  or  appt  Here   this week  To decide on what can be done in the interim.

## 2011-01-01 NOTE — Telephone Encounter (Signed)
LMTCB

## 2011-01-10 ENCOUNTER — Other Ambulatory Visit: Payer: Self-pay | Admitting: Neurosurgery

## 2011-01-10 DIAGNOSIS — M549 Dorsalgia, unspecified: Secondary | ICD-10-CM

## 2011-01-17 ENCOUNTER — Ambulatory Visit
Admission: RE | Admit: 2011-01-17 | Discharge: 2011-01-17 | Disposition: A | Discharge status: Home / Self Care | Payer: Medicare Other | Source: Ambulatory Visit | Attending: Neurosurgery | Admitting: Neurosurgery

## 2011-01-17 DIAGNOSIS — M549 Dorsalgia, unspecified: Secondary | ICD-10-CM

## 2011-01-17 MED ORDER — GADOBENATE DIMEGLUMINE 529 MG/ML IV SOLN
19.0000 mL | Freq: Once | INTRAVENOUS | Status: AC | PRN
  Administered 2011-01-17: 19 mL via INTRAVENOUS

## 2011-01-21 ENCOUNTER — Ambulatory Visit: Payer: BC Managed Care – PPO | Admitting: Internal Medicine

## 2011-01-21 ENCOUNTER — Encounter: Payer: Self-pay | Admitting: Internal Medicine

## 2011-01-21 ENCOUNTER — Ambulatory Visit (INDEPENDENT_AMBULATORY_CARE_PROVIDER_SITE_OTHER): Payer: Medicare Other | Admitting: Internal Medicine

## 2011-01-21 VITALS — BP 160/80 | HR 78 | Ht 70.5 in | Wt 221.0 lb

## 2011-01-21 DIAGNOSIS — R52 Pain, unspecified: Secondary | ICD-10-CM

## 2011-01-21 DIAGNOSIS — Z0000 Encounter for general adult medical examination without abnormal findings: Secondary | ICD-10-CM

## 2011-01-21 DIAGNOSIS — Z23 Encounter for immunization: Secondary | ICD-10-CM

## 2011-01-21 DIAGNOSIS — IMO0002 Reserved for concepts with insufficient information to code with codable children: Secondary | ICD-10-CM

## 2011-01-21 DIAGNOSIS — E785 Hyperlipidemia, unspecified: Secondary | ICD-10-CM

## 2011-01-21 DIAGNOSIS — R1310 Dysphagia, unspecified: Secondary | ICD-10-CM

## 2011-01-21 DIAGNOSIS — R7309 Other abnormal glucose: Secondary | ICD-10-CM

## 2011-01-21 DIAGNOSIS — F528 Other sexual dysfunction not due to a substance or known physiological condition: Secondary | ICD-10-CM

## 2011-01-21 DIAGNOSIS — I10 Essential (primary) hypertension: Secondary | ICD-10-CM

## 2011-01-21 NOTE — Patient Instructions (Signed)
Plan fasting labs Check your blood pressure readings. They are too high in the office. I want you to see Gi doctor for intermittent trouble swallowing . In the meantime can try adding OTC prilosec every day for 2 weeks or so. Avoid alcohol  Ibuprofen can  Interfere with BP meds Have Dr Cyndy Freeze send [place] a copy of his notes and plan.

## 2011-01-21 NOTE — Progress Notes (Signed)
Subjective:    Jeremy Black is a 65 y.o. male who presents for a welcome to Medicare exam.  Taking  Ibuprofen 600 tid  For pain.  Since saw Dr Cyndy Freeze for his back  MRI pending.  Under evaluation for back and now has follow up appointment. dysphagia   For about 6-8 months Solids a t time and  Sometime s has to " throw up"  Couple times a week. Waxes and wanes .  Slows down eating  If feels coming on  No heart burn   Takes  7 pills at one time  With water.  Nephew  Died off life support from [place] and friend  Passes recently.  Cardiac risk factors: advanced age (older than 48 for men, 1 for women), hypertension and male gender.    Hearing:  Ok   Vision:  No limitations at present .  Safety:  Has smoke detector and wears seat belts.   No excess sun exposure. Sees dentist regularly.  Falls: None   Advance directive :  Reviewed  Has one.  Memory: Felt to be good  , no concern from  family.  Depression: No anhedonia unusual crying or depressive symptoms  Nutrition: Eats well balanced diet; adequate calcium and vitamin D. No  chewiing problems.   Injury: no major injuries in the last six months.  Other healthcare providers:  Reviewed today .  Social:  Lives  Alone . No pets.   Preventive parameters: up-to-date on colonoscopy, mammogram, immunizations. Including Tdap and pneumovax.  ADLS:   There are no problems or need for assistance  driving, feeding, obtaining food, dressing, toileting and bathing, managing money using phone. He is independent.   Depression Screen (Note: if answer to either of the following is "Yes", a more complete depression screening is indicated)  Q1: Over the past two weeks, have you felt down, depressed or hopeless? yes Q2: Over the past two weeks, have you felt little interest or pleasure in doing things? no  Activities of Daily Living In your present state of health, do you have any difficulty performing the following activities?:  Preparing  food and eating?: No Bathing yourself: No Getting dressed: No Using the toilet:No Moving around from place to place: No In the past year have you fallen or had a near fall?:No  Current exercise habits: Walking and playing golf Dietary issues discussed:  healthy Hearing difficulties: no Safe in current home environment: yes   Review of Systems ROS:  GEN/ HEENTNo fever, significant weight changes sweats headaches vision problems hearing changes, CV/ PULM; No chest pain shortness of breath cough, syncope,edema  change in exercise tolerance. GI /GU: No adominal pain, vomiting, change in bowel habits. No blood in the stool. No significant GU symptoms. SKIN/HEME: ,no acute skin rashes suspicious lesions or bleeding. No lymphadenopathy, nodules, masses.  NEURO/ PSYCH:  Burning lateral leg sx     also with back pain since September  And not walking as much  See hpi.  IMM/ Allergy: No unusual infections.  Allergy .   REST of 12 system review negative  Past Medical History  Diagnosis Date  . Hyperlipidemia   . Hypertension   . ED (erectile dysfunction)   . Proteinuria     past record showed 500mg  protein excretion per 24 hours eval by renal  . PTSD (post-traumatic stress disorder)     from [place] experience [place]  . Normal nuclear stress test     myoview neg 12 09 except for  HT   Past Surgical History  Procedure Date  . Ls spinal surgery   . Nm myoview ltd 12/09    neg except for HT    reports that he has quit smoking. He does not have any smokeless tobacco history on file. He reports that he does not drink alcohol or use illicit drugs. family history includes Coronary artery disease in his sister; Diabetes in his mother and sister; Heart disease in his sister; Hypertension in his brother and mother; Other in his mother; and Throat cancer in his father. Allergies  Allergen Reactions  . Codeine Phosphate     REACTION: unspecified   Outpatient Prescriptions Prior to Visit    Medication Sig Dispense Refill  . aspirin 81 MG chewable tablet Chew 81 mg by mouth daily.        Marland Kitchen atorvastatin (LIPITOR) 20 MG tablet Take 1 tablet (20 mg total) by mouth daily.  90 tablet  1  . Multiple Vitamins-Minerals (CENTRUM SILVER PO) Take by mouth.        . nebivolol (BYSTOLIC) 10 MG tablet Take 10 mg by mouth daily. 1/2 daily      . tadalafil (CIALIS) 20 MG tablet Take 20 mg by mouth daily as needed.        Marland Kitchen telmisartan (MICARDIS) 80 MG tablet Take 1 tablet (80 mg total) by mouth daily.  90 tablet  1  . triamterene-hydrochlorothiazide (MAXZIDE-25) 37.5-25 MG per tablet Take 1 tablet by mouth daily.  90 tablet  1  . zolpidem (AMBIEN) 10 MG tablet Take 10 mg by mouth at bedtime as needed.        . Garlic 123XX123 MG TABS Take by mouth.        . meloxicam (MOBIC) 15 MG tablet Take 15 mg by mouth daily.        . verapamil (COVERA HS) 240 MG (CO) 24 hr tablet Take 240 mg by mouth at bedtime.             Objective:     Vision done in Aug 14, 2010, normal Done by Dr. Connye Burkitt- has cataracts starting Blood pressure 160/80, pulse 78, height 5' 10.5" (1.791 m), weight 221 lb (100.245 kg). Body mass index is 31.26 kg/(m^2).  Physical Exam: Vital signs reviewed [id number] is a well-developed well-nourished alert cooperative AA male  who appears   stated age in no acute distress.  HEENT: normocephalic  traumatic , Eyes: PERRL EOM's full, conjunctiva clear, Nares: patent no deformity discharge or tenderness., Ears: no deformity EAC's clear TMs with normal landmarks. Mouth: clear OP, no lesions, edema.  Moist mucous membranes. Dentition in adequate repair. NECK: supple without masses, thyromegaly or bruits. CHEST/PULM:  Clear to auscultation and percussion breath sounds equal no wheeze , rales or rhonchi. No chest wall deformities or tenderness. CV: PMI is nondisplaced, S1 S2 no gallops, murmurs, rubs. Peripheral pulses are full without delay.No JVD .  ABDOMEN: Bowel sounds normal nontender  No  guard or rebound, no hepato splenomegal no CVA tenderness.  No hernia. Extremtities:  No clubbing cyanosis or edema, no acute joint swelling or redness no focal atrophy NEURO:  Oriented x3, cranial nerves 3-12 appear to be intact, no obvious focal weakness,gait within normal limit SKIN: No acute rashes normal turgor, color, no bruising or petechiae. Soft Nora nodule or herniation right leg no redness or bruising PSYCH: Oriented, good eye contact, no obvious depression anxiety, cognition and judgment appear normal. LN:  No cervical axillary or inguinal adenopathy Oriented  x 3 and no noted deficits in memory, attention, and speech.        Assessment:  Preventive Health Care Counseled regarding healthy nutrition, exercise, sleep, injury prevention, calcium vit d and healthy weight .  Caution with etoh.  Recent losses   HT not as good control poss from pain   Weight gain  Follow DJD back problems   Legs burning laterally poss from radicular sx  Will follow Dysphagia new onset needs work up ED  No change asks for sample  cialis given Sleep disc  LIPIDS  Nose of meds  PTSD veteran  [facility] L[facility] Johns doing well       Plan:  See above   Refer to GI    During the course of the visit the patient was educated and counseled about appropriate screening and preventive services i    Patient Instructions (the written plan) was given to the patient.   Medicare Attestation I have personally reviewed: The patient's medical and social history Their use of alcohol, tobacco or illicit drugs Their current medications and supplements The patient's functional ability including ADLs,fall risks, home safety risks, cognitive, and hearing and visual impairment Diet and physical activities Evidence for depression or mood disorders  The patient's weight, height, BMI, and visual acuity have been recorded in the chart.  I have made referrals, counseling, and provided education to the patient based on  review of the above and I have provided the patient with a written personalized care plan for preventive services.

## 2011-01-21 NOTE — Progress Notes (Signed)
  Subjective:    Patient ID: Jeremy Black, male    DOB: September 25, 1945, 65 y.o.   MRN: [id number]  HPI    Review of Systems     Objective:   Physical Exam        Assessment & Plan:

## 2011-01-26 ENCOUNTER — Encounter: Payer: Self-pay | Admitting: Internal Medicine

## 2011-01-26 DIAGNOSIS — R1310 Dysphagia, unspecified: Secondary | ICD-10-CM | POA: Insufficient documentation

## 2011-01-28 ENCOUNTER — Encounter: Payer: Self-pay | Admitting: Internal Medicine

## 2011-01-31 ENCOUNTER — Other Ambulatory Visit (INDEPENDENT_AMBULATORY_CARE_PROVIDER_SITE_OTHER): Payer: Medicare Other

## 2011-01-31 DIAGNOSIS — I10 Essential (primary) hypertension: Secondary | ICD-10-CM

## 2011-01-31 DIAGNOSIS — IMO0002 Reserved for concepts with insufficient information to code with codable children: Secondary | ICD-10-CM

## 2011-01-31 DIAGNOSIS — E785 Hyperlipidemia, unspecified: Secondary | ICD-10-CM

## 2011-01-31 DIAGNOSIS — F528 Other sexual dysfunction not due to a substance or known physiological condition: Secondary | ICD-10-CM

## 2011-01-31 DIAGNOSIS — R7309 Other abnormal glucose: Secondary | ICD-10-CM

## 2011-01-31 DIAGNOSIS — R1310 Dysphagia, unspecified: Secondary | ICD-10-CM

## 2011-01-31 DIAGNOSIS — Z23 Encounter for immunization: Secondary | ICD-10-CM

## 2011-01-31 DIAGNOSIS — R52 Pain, unspecified: Secondary | ICD-10-CM

## 2011-01-31 LAB — BASIC METABOLIC PANEL
BUN: 8
CO2: 21 mEq/L (ref 19–32)
Calcium: 8.5 mg/dL (ref 8.4–10.5)
Chloride: 104
GFR calc non Af Amer: >60
GFR: 86.29 mL/min (ref >60.00)
Glucose, Bld: 105 mg/dL — ABNORMAL HIGH (ref 70–99)
Potassium: 4
Potassium: 3.3 mEq/L — ABNORMAL LOW (ref 3.5–5.1)
Sodium: 133 — ABNORMAL LOW
Sodium: 139 mEq/L (ref 135–145)

## 2011-01-31 LAB — CBC WITH DIFFERENTIAL/PLATELET
Basophils Absolute: 0 10*3/uL (ref 0.0–0.1)
Basophils Relative: 0 % (ref 0.0–3.0)
Eosinophils Absolute: 0 10*3/uL (ref 0.0–0.7)
Hemoglobin: 16 g/dL (ref 13.0–17.0)
Lymphocytes Relative: 11.8 % — ABNORMAL LOW (ref 12.0–46.0)
MCHC: 33.6 g/dL (ref 30.0–36.0)
Monocytes Relative: 6.2 % (ref 3.0–12.0)
Neutro Abs: 9.6 10*3/uL — ABNORMAL HIGH (ref 1.4–7.7)
Neutrophils Relative %: 81.9 % — ABNORMAL HIGH (ref 43.0–77.0)
RBC: 5.25 Mil/uL (ref 4.22–5.81)
WBC: 11.8 10*3/uL — ABNORMAL HIGH (ref 4.5–10.5)

## 2011-01-31 LAB — CBC
HCT: 42.6
Hemoglobin: 14.1
MCV: 87.2
WBC: 5.9

## 2011-01-31 LAB — LIPID PANEL
Cholesterol: 161 mg/dL (ref 0–200)
HDL: 54.6 mg/dL (ref >39.00)
Total CHOL/HDL Ratio: 3
Triglycerides: 325 mg/dL — ABNORMAL HIGH (ref 0.0–149.0)
VLDL: 65 mg/dL — ABNORMAL HIGH (ref 0.0–40.0)

## 2011-01-31 LAB — LDL CHOLESTEROL, DIRECT: Direct LDL: 66.7 mg/dL

## 2011-01-31 LAB — HEPATIC FUNCTION PANEL
ALT: 38 U/L (ref 0–53)
Albumin: 3.8 g/dL (ref 3.5–5.2)
Total Protein: 7.1 g/dL (ref 6.0–8.3)

## 2011-01-31 LAB — VITAMIN B12: Vitamin B-12: 311 pg/mL (ref 211–911)

## 2011-01-31 LAB — ABO/RH: ABO/RH(D): O POS

## 2011-01-31 LAB — TYPE AND SCREEN

## 2011-02-01 ENCOUNTER — Encounter: Payer: Self-pay | Admitting: *Deleted

## 2011-02-12 ENCOUNTER — Encounter: Payer: Self-pay | Admitting: Internal Medicine

## 2011-02-14 ENCOUNTER — Emergency Department (HOSPITAL_COMMUNITY)
Admission: EM | Admit: 2011-02-14 | Discharge: 2011-02-15 | Disposition: A | Discharge status: Home / Self Care | Payer: Medicare Other | Attending: Emergency Medicine | Admitting: Emergency Medicine

## 2011-02-14 DIAGNOSIS — I2510 Atherosclerotic heart disease of native coronary artery without angina pectoris: Secondary | ICD-10-CM | POA: Insufficient documentation

## 2011-02-14 DIAGNOSIS — R45851 Suicidal ideations: Secondary | ICD-10-CM | POA: Insufficient documentation

## 2011-02-14 DIAGNOSIS — F1010 Alcohol abuse, uncomplicated: Secondary | ICD-10-CM | POA: Insufficient documentation

## 2011-02-14 DIAGNOSIS — I10 Essential (primary) hypertension: Secondary | ICD-10-CM | POA: Insufficient documentation

## 2011-02-14 LAB — RAPID URINE DRUG SCREEN, HOSP PERFORMED
Amphetamines: NOT DETECTED
Barbiturates: NOT DETECTED
Benzodiazepines: NOT DETECTED
Cocaine: NOT DETECTED
Opiates: NOT DETECTED
Tetrahydrocannabinol: NOT DETECTED

## 2011-02-14 LAB — COMPREHENSIVE METABOLIC PANEL
Albumin: 3.5 g/dL (ref 3.5–5.2)
Alkaline Phosphatase: 62 U/L (ref 39–117)
BUN: 11 mg/dL (ref 6–23)
Chloride: 106 mEq/L (ref 96–112)
Glucose, Bld: 111 mg/dL — ABNORMAL HIGH (ref 70–99)
Potassium: 3.3 mEq/L — ABNORMAL LOW (ref 3.5–5.1)
Total Bilirubin: 0.4 mg/dL (ref 0.3–1.2)

## 2011-02-14 LAB — COMPREHENSIVE METABOLIC PANEL WITH GFR
ALT: 38 U/L (ref 0–53)
AST: 76 U/L — ABNORMAL HIGH (ref 0–37)
CO2: 21 meq/L (ref 19–32)
Calcium: 8.1 mg/dL — ABNORMAL LOW (ref 8.4–10.5)
Creatinine, Ser: 1 mg/dL (ref 0.50–1.35)
GFR calc Af Amer: 89 mL/min — ABNORMAL LOW (ref >90)
GFR calc non Af Amer: 77 mL/min — ABNORMAL LOW (ref >90)
Sodium: 144 meq/L (ref 135–145)
Total Protein: 6.7 g/dL (ref 6.0–8.3)

## 2011-02-14 LAB — DIFFERENTIAL
Basophils Absolute: 0 10*3/uL (ref 0.0–0.1)
Basophils Relative: 0 % (ref 0–1)
Eosinophils Absolute: 0 K/uL (ref 0.0–0.7)
Eosinophils Relative: 0 % (ref 0–5)
Lymphocytes Relative: 21 % (ref 12–46)
Lymphs Abs: 1.6 K/uL (ref 0.7–4.0)
Monocytes Absolute: 0.4 K/uL (ref 0.1–1.0)
Monocytes Relative: 5 % (ref 3–12)
Neutro Abs: 5.5 10*3/uL (ref 1.7–7.7)
Neutrophils Relative %: 73 % (ref 43–77)

## 2011-02-14 LAB — CBC
HCT: 42.6 % (ref 39.0–52.0)
Hemoglobin: 13.9 g/dL (ref 13.0–17.0)
MCH: 28.8 pg (ref 26.0–34.0)
MCHC: 32.6 g/dL (ref 30.0–36.0)
MCV: 88.4 fL (ref 78.0–100.0)
Platelets: 187 K/uL (ref 150–400)
RBC: 4.82 MIL/uL (ref 4.22–5.81)
RDW: 16 % — ABNORMAL HIGH (ref 11.5–15.5)
WBC: 7.5 10*3/uL (ref 4.0–10.5)

## 2011-02-14 LAB — ETHANOL: Alcohol, Ethyl (B): 314 mg/dL — ABNORMAL HIGH (ref 0–11)

## 2011-02-15 ENCOUNTER — Ambulatory Visit: Payer: Medicare Other | Admitting: Internal Medicine

## 2011-02-15 LAB — ETHANOL: Alcohol, Ethyl (B): 215 mg/dL — ABNORMAL HIGH (ref 0–11)

## 2011-02-17 ENCOUNTER — Ambulatory Visit (HOSPITAL_COMMUNITY)
Admission: RE | Admit: 2011-02-17 | Discharge: 2011-02-17 | Disposition: A | Discharge status: Home / Self Care | Payer: Medicare Other | Source: Ambulatory Visit | Attending: Psychiatry | Admitting: Psychiatry

## 2011-02-17 DIAGNOSIS — F1010 Alcohol abuse, uncomplicated: Secondary | ICD-10-CM | POA: Insufficient documentation

## 2011-02-17 NOTE — Progress Notes (Signed)
Assessment Note   Jeremy Black is an 65 y.o. male, who lives with his 46 year old mother. Pt reported that he has been drinking alcohol daily for months and that he wants help. Pt reported that he has PTSD and that drinking has been his coping mechanism. Pt reported that he spends most of his time in the bed and that he also does not eat. Pt reported that he loss his wife to cancer in February of this year, and that his girlfriend broke up with him last month because of his drinking problem. Pt reported that he does not feel that he needs inpatient detox, and that he would benefit from outpatient services. Pt reported that he was negative SI/HI, no AH/VH noted. Pt reported that he felt good and that it made him feel better when he came to [facility] today for referral for outpatient services.      Axis I: Alcohol Abuse Axis II: No diagnosis Axis III:  Past Medical History  Diagnosis Date  . Hyperlipidemia   . Hypertension   . ED (erectile dysfunction)   . Proteinuria     past record showed 500mg  protein excretion per 24 hours eval by renal  . PTSD (post-traumatic stress disorder)     from [place] experience [place]  . Normal nuclear stress test     myoview neg 12 09 except for HT   Axis IV: other psychosocial or environmental problems Axis V: 61-70 mild symptoms  Past Medical History:  Past Medical History  Diagnosis Date  . Hyperlipidemia   . Hypertension   . ED (erectile dysfunction)   . Proteinuria     past record showed 500mg  protein excretion per 24 hours eval by renal  . PTSD (post-traumatic stress disorder)     from [place] experience [place]  . Normal nuclear stress test     myoview neg 12 09 except for HT    Past Surgical History  Procedure Date  . Ls spinal surgery   . Nm myoview ltd 12/09    neg except for HT    Family History:  Family History  Problem Relation Age of Onset  . Diabetes Mother   . Hypertension Mother   . Other Mother     nerves  . Throat cancer Father       deceased  . Coronary artery disease Sister   . Diabetes Sister   . Heart disease Sister   . Hypertension Brother     Social History:  reports that he has quit smoking. He does not have any smokeless tobacco history on file. He reports that he does not drink alcohol or use illicit drugs. As of 02/17/11 patient reported that he has been drinking alcohol daily for months and wants help for his drinking problem.   Allergies:  Allergies  Allergen Reactions  . Codeine Phosphate     REACTION: unspecified    Home Medications:  Medications Prior to Admission  Medication Sig Dispense Refill  . aspirin 81 MG chewable tablet Chew 81 mg by mouth daily.        Marland Kitchen atorvastatin (LIPITOR) 20 MG tablet Take 1 tablet (20 mg total) by mouth daily.  90 tablet  1  . Garlic 123XX123 MG TABS Take by mouth.        Marland Kitchen ibuprofen (ADVIL,MOTRIN) 600 MG tablet Take 600 mg by mouth every 8 (eight) hours as needed.        . Multiple Vitamins-Minerals (CENTRUM SILVER PO) Take by mouth.        Marland Kitchen  nebivolol (BYSTOLIC) 10 MG tablet Take 10 mg by mouth daily. 1/2 daily      . tadalafil (CIALIS) 20 MG tablet Take 20 mg by mouth daily as needed.        Marland Kitchen telmisartan (MICARDIS) 80 MG tablet Take 1 tablet (80 mg total) by mouth daily.  90 tablet  1  . triamterene-hydrochlorothiazide (MAXZIDE-25) 37.5-25 MG per tablet Take 1 tablet by mouth daily.  90 tablet  1  . zolpidem (AMBIEN) 10 MG tablet Take 10 mg by mouth at bedtime as needed.         No current facility-administered medications on file as of 02/17/2011.    OB/GYN Status:  No LMP for male patient.  General Assessment Data Living Arrangements: Family members (lives with mother who is 65) Can pt return to current living arrangement?: Yes Admission Status: Voluntary Is patient capable of signing voluntary admission?: Yes Transfer from: Home Referral Source: Self/Family/Friend  Risk to self Suicidal Ideation: No Suicidal Intent: No Is patient at risk for  suicide?: No Suicidal Plan?: No Access to Means: No What has been your use of drugs/alcohol within the last 12 months?: alcohol use daily Other Self Harm Risks: none  Triggers for Past Attempts: None known Intentional Self Injurious Behavior: None Factors that decrease suicide risk: Positive social support Family Suicide History: No Recent stressful life event(s): Other (Comment) (none) Persecutory voices/beliefs?: No Depression: Yes Depression Symptoms: Feeling worthless/self pity (patient reported that he recently lost girlfriend to drinkin) Substance abuse history and/or treatment for substance abuse?: Yes Suicide prevention information given to non-admitted patients: Not applicable  Risk to Others Homicidal Ideation: No Thoughts of Harm to Others: No Current Homicidal Intent: No Current Homicidal Plan: No Access to Homicidal Means: No Identified Victim: none  History of harm to others?: No Assessment of Violence: None Noted Violent Behavior Description: none noted Does patient have access to weapons?: No Criminal Charges Pending?: No Does patient have a court date: No  Mental Status Report Appear/Hygiene:  (neat and clean) Eye Contact: Good Motor Activity: Unremarkable Speech: Logical/coherent Level of Consciousness: Alert Mood: Depressed Affect: Depressed Anxiety Level: None Judgement: Unimpaired Orientation: Person;Place;Time;Situation Obsessive Compulsive Thoughts/Behaviors: None  Cognitive Functioning Concentration: Normal Memory: Recent Intact;Remote Intact IQ: Average Insight: Good Impulse Control: Good Appetite: Good Weight Loss: 10  (patient took a guest reported that he last ate on Tues.) Weight Gain: 0  Sleep: Decreased Total Hours of Sleep: 4  Vegetative Symptoms: None  Prior Inpatient/Outpatient Therapy Prior Therapy: Inpatient Prior Therapy Dates: 08/2010 Prior Therapy Facilty/Provider(s): [facility] Reason for Treatment: Detox                      Additional Information 1:1 In Past 12 Months?: No CIRT Risk: No Elopement Risk: No Does patient have medical clearance?: No  Child/Adolescent Assessment Running Away Risk: Denies Bed-Wetting: Denies Destruction of Property: Denies Cruelty to Animals: Denies Stealing: Denies Rebellious/Defies Authority: Denies Satanic Involvement: Denies Science writer: Denies Problems at [employer]: Denies Gang Involvement: Denies  Disposition:  Disposition Disposition of Patient: Outpatient treatment Type of outpatient treatment: Chemical Dependence - Intensive Outpatient  On Site Evaluation by:   Reviewed with Physician:     Ventura Sellers 02/17/2011 12:57 PM

## 2011-02-17 NOTE — Progress Notes (Signed)
Assessment Note   Jeremy Black is an 65 y.o. male.   Axis I: Alcohol Abuse Axis II: No diagnosis Axis III:  Past Medical History  Diagnosis Date  . Hyperlipidemia   . Hypertension   . ED (erectile dysfunction)   . Proteinuria     past record showed 500mg  protein excretion per 24 hours eval by renal  . PTSD (post-traumatic stress disorder)     from [place] experience [place]  . Normal nuclear stress test     myoview neg 12 09 except for HT   Axis IV: other psychosocial or environmental problems Axis V: 61-70 mild symptoms  Past Medical History:  Past Medical History  Diagnosis Date  . Hyperlipidemia   . Hypertension   . ED (erectile dysfunction)   . Proteinuria     past record showed 500mg  protein excretion per 24 hours eval by renal  . PTSD (post-traumatic stress disorder)     from [place] experience [place]  . Normal nuclear stress test     myoview neg 12 09 except for HT    Past Surgical History  Procedure Date  . Ls spinal surgery   . Nm myoview ltd 12/09    neg except for HT    Family History:  Family History  Problem Relation Age of Onset  . Diabetes Mother   . Hypertension Mother   . Other Mother     nerves  . Throat cancer Father     deceased  . Coronary artery disease Sister   . Diabetes Sister   . Heart disease Sister   . Hypertension Brother     Social History:  reports that he has quit smoking. He does not have any smokeless tobacco history on file. He reports that he does not drink alcohol or use illicit drugs.  Allergies:  Allergies  Allergen Reactions  . Codeine Phosphate     REACTION: unspecified    Home Medications:  Medications Prior to Admission  Medication Sig Dispense Refill  . aspirin 81 MG chewable tablet Chew 81 mg by mouth daily.        Marland Kitchen atorvastatin (LIPITOR) 20 MG tablet Take 1 tablet (20 mg total) by mouth daily.  90 tablet  1  . Garlic 123XX123 MG TABS Take by mouth.        Marland Kitchen ibuprofen (ADVIL,MOTRIN) 600 MG tablet Take 600 mg  by mouth every 8 (eight) hours as needed.        . Multiple Vitamins-Minerals (CENTRUM SILVER PO) Take by mouth.        . nebivolol (BYSTOLIC) 10 MG tablet Take 10 mg by mouth daily. 1/2 daily      . tadalafil (CIALIS) 20 MG tablet Take 20 mg by mouth daily as needed.        Marland Kitchen telmisartan (MICARDIS) 80 MG tablet Take 1 tablet (80 mg total) by mouth daily.  90 tablet  1  . triamterene-hydrochlorothiazide (MAXZIDE-25) 37.5-25 MG per tablet Take 1 tablet by mouth daily.  90 tablet  1  . zolpidem (AMBIEN) 10 MG tablet Take 10 mg by mouth at bedtime as needed.         No current facility-administered medications on file as of 02/17/2011.    OB/GYN Status:  No LMP for male patient.  General Assessment Data Living Arrangements: Family members (lives with mother who is 67) Can pt return to current living arrangement?: Yes Admission Status: Voluntary Is patient capable of signing voluntary admission?: Yes Transfer from: Home  Referral Source: Self/Family/Friend  Risk to self Suicidal Ideation: No Suicidal Intent: No Is patient at risk for suicide?: No Suicidal Plan?: No Access to Means: No What has been your use of drugs/alcohol within the last 12 months?: alcohol use daily Other Self Harm Risks: none  Triggers for Past Attempts: None known Intentional Self Injurious Behavior: None Factors that decrease suicide risk: Positive social support Family Suicide History: No Recent stressful life event(s): Other (Comment) (none) Persecutory voices/beliefs?: No Depression: Yes Depression Symptoms: Feeling worthless/self pity (patient reported that he recently lost girlfriend to drinkin) Substance abuse history and/or treatment for substance abuse?: Yes Suicide prevention information given to non-admitted patients: Not applicable  Risk to Others Homicidal Ideation: No Thoughts of Harm to Others: No Current Homicidal Intent: No Current Homicidal Plan: No Access to Homicidal Means:  No Identified Victim: none  History of harm to others?: No Assessment of Violence: None Noted Violent Behavior Description: none noted Does patient have access to weapons?: No Criminal Charges Pending?: No Does patient have a court date: No  Mental Status Report Appear/Hygiene:  (neat and clean) Eye Contact: Good Motor Activity: Unremarkable Speech: Logical/coherent Level of Consciousness: Alert Mood: Depressed Affect: Depressed Anxiety Level: None Judgement: Unimpaired Orientation: Person;Place;Time;Situation Obsessive Compulsive Thoughts/Behaviors: None  Cognitive Functioning Concentration: Normal Memory: Recent Intact;Remote Intact IQ: Average Insight: Good Impulse Control: Good Appetite: Good Weight Loss: 10  (patient took a guest reported that he last ate on Tues.) Weight Gain: 0  Sleep: Decreased Total Hours of Sleep: 4  Vegetative Symptoms: None  Prior Inpatient/Outpatient Therapy Prior Therapy: Inpatient Prior Therapy Dates: 08/2010 Prior Therapy Facilty/Provider(s): [facility] Reason for Treatment: Detox                     Additional Information 1:1 In Past 12 Months?: No CIRT Risk: No Elopement Risk: No Does patient have medical clearance?: No  Child/Adolescent Assessment Running Away Risk: Denies Bed-Wetting: Denies Destruction of Property: Denies Cruelty to Animals: Denies Stealing: Denies Rebellious/Defies Authority: Denies Satanic Involvement: Denies Science writer: Denies Problems at [employer]: Denies Gang Involvement: Denies  Disposition:  Disposition Disposition of Patient: Outpatient treatment Type of outpatient treatment: Chemical Dependence - Intensive Outpatient  On Site Evaluation by:   Reviewed with Physician:     Ventura Sellers 02/17/2011 2:28 PM

## 2011-02-18 ENCOUNTER — Encounter: Payer: Medicare Other | Admitting: Internal Medicine

## 2011-02-20 ENCOUNTER — Other Ambulatory Visit (HOSPITAL_COMMUNITY): Payer: Medicare Other | Attending: Physician Assistant | Admitting: Psychology

## 2011-02-20 ENCOUNTER — Encounter (HOSPITAL_COMMUNITY): Payer: Self-pay | Admitting: Psychology

## 2011-02-20 DIAGNOSIS — I10 Essential (primary) hypertension: Secondary | ICD-10-CM | POA: Insufficient documentation

## 2011-02-20 DIAGNOSIS — G8929 Other chronic pain: Secondary | ICD-10-CM | POA: Insufficient documentation

## 2011-02-20 DIAGNOSIS — M549 Dorsalgia, unspecified: Secondary | ICD-10-CM | POA: Insufficient documentation

## 2011-02-20 DIAGNOSIS — F1010 Alcohol abuse, uncomplicated: Secondary | ICD-10-CM | POA: Insufficient documentation

## 2011-02-20 NOTE — Progress Notes (Signed)
The patient is a 65 yo widowed, African-American, male seeking entry into the CD-IOP. He lives with his 2 yo mother here in [place]. He is requesting treatment to address his alcohol dependence. The patient reported he went to the [place] in [place] in May of this year and stayed 4 days in the detox facility. He began drinking after returning to [place]. His wife died of cancer in Jul 10, 2022 of this year. He has 2 grown children who also live in [place]. The patient reported his elderly mother is suffering from dementia, cannot hear or see, and caring for her has proven extremely stressful. He admitted that alcohol has been his primary coping skill. 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 He has dreams that are very vivid and entail much of the horror and devastation that he witnessed while there. The patient admitted he had used heroin in the 60's and also tried cocaine, but he stopped using them after a short time. The patient reported a strong faith and regular church attendance. He was receptive to recommendations about AA attendance and assured me he would go to some meetings. documentation was reviewed, signatures obtained and the orientation completed successfully. The patient will return this afternoon and begin treatment in the CD-IOP

## 2011-02-21 ENCOUNTER — Other Ambulatory Visit (HOSPITAL_COMMUNITY): Payer: Self-pay | Admitting: *Deleted

## 2011-02-21 DIAGNOSIS — F1920 Other psychoactive substance dependence, uncomplicated: Secondary | ICD-10-CM

## 2011-02-21 NOTE — Progress Notes (Signed)
    Daily Group Progress Note  Program: CD-IOP   Group Time: 1-2:30  Participation Level: Active  Behavioral Response: Appropriate, Sharing and Assertive  Type of Therapy: Psycho-education Group  Topic:The Disease Concept of Addiction; A presentation was provided on the chronic nature of addiction. Emphasis was placed on "managing" the disease through a daily program. The importance of routine in observing the daily schedule was reiterated. The session demonstrated that living a quality life through daily management of symptoms is very possible. There was good disclosure among group members and many agreed that complacency had brought about their relapses in the past.          Group Time: 2:45-4 pm  Participation Level: Active  Behavioral Response: Appropriate, Sharing and Assertive  Type of Therapy: Process Group  Topic: Group Process and Graduation: Second half of group was spent in process. Members were asked to share their feelings about current struggles and issues in early recovery. Near the end of the session, a graduation ceremony was held for a members graduating successfully from the program.   Summary: the patient was new to the group. He was very open and honest about his alcohol use. He shared about his current stressors, including his 46 yo mother for whom he lives and cares for. He also shared about his grief over losses within the last year, including his wife, nephew and good friend. The patient was well-received by his fellow group members and responded very well to the intervention.    Family Program: Family present? No   Name of family member(s):   UDS collected: No Results: negative  AA/NA attended?: No  Sponsor?: No   Laquisha Northcraft, LCAS

## 2011-02-22 ENCOUNTER — Other Ambulatory Visit (HOSPITAL_COMMUNITY): Payer: Self-pay | Admitting: Physician Assistant

## 2011-02-22 ENCOUNTER — Other Ambulatory Visit (HOSPITAL_COMMUNITY): Payer: Medicare Other | Admitting: Psychology

## 2011-02-22 DIAGNOSIS — F1920 Other psychoactive substance dependence, uncomplicated: Secondary | ICD-10-CM

## 2011-02-23 LAB — DRUGS OF ABUSE SCREEN W/O ALC, ROUTINE URINE
Cocaine Metabolites: NEGATIVE
Methadone: NEGATIVE
Opiate Screen, Urine: NEGATIVE
Phencyclidine (PCP): NEGATIVE

## 2011-02-25 ENCOUNTER — Other Ambulatory Visit (HOSPITAL_COMMUNITY): Payer: Medicare Other

## 2011-02-25 DIAGNOSIS — F4310 Post-traumatic stress disorder, unspecified: Secondary | ICD-10-CM

## 2011-02-25 MED ORDER — TRAZODONE HCL 50 MG PO TABS: 50.0000 mg | ORAL_TABLET | Freq: Every day | ORAL | Status: DC

## 2011-02-25 NOTE — Progress Notes (Unsigned)
Psychiatric Assessment Adult  Patient Identification:  Jeremy Black Date of Evaluation:  02/25/2011 Chief Complaint: Inability to control alcohol consumption History of Chief Complaint:  No chief complaint on file.   HPI Review of Systems Physical Exam  Depressive Symptoms: insomnia  (Hypo) Manic Symptoms:   Elevated Mood:  No Irritable Mood:  Yes Grandiosity:  No Distractibility:  No Labiality of Mood:  No Delusions:  No Hallucinations:  No Impulsivity:  No Sexually Inappropriate Behavior:  Yes Financial Extravagance:  No Flight of Ideas:  No  Anxiety Symptoms: Excessive Worry:  Yes Panic Symptoms:  Yes Agoraphobia:  No Obsessive Compulsive: No  Symptoms: None Specific Phobias:  No Social Anxiety:  No  PTSD Symptoms: Ever had a traumatic exposure:  Yes Had a traumatic exposure in the last month:  No Re-experiencing: Yes Flashbacks Intrusive Thoughts Nightmares Hypervigilance:  Yes Hyperarousal: Yes Increased Startle Response Irritability/Anger Sleep Avoidance: No None  Traumatic Brain Injury: No   Past Psychiatric History: Diagnosis: PTSD  Hospitalizations: Detox at [facility] May 2012    Substance Abuse Care: As above  Self-Mutilation: None  Suicidal Attempts: None  Violent Behaviors: None   Past Medical History:   Past Medical History  Diagnosis Date  . Hyperlipidemia   . Hypertension   . ED (erectile dysfunction)   . Proteinuria     past record showed 500mg  protein excretion per 24 hours eval by renal  . PTSD (post-traumatic stress disorder)     from [place] experience [place]  . Normal nuclear stress test     myoview neg 12 09 except for HT   History of Loss of Consciousness:  No Seizure History:  No Cardiac History:  Yes Allergies:   Allergies  Allergen Reactions  . Codeine Phosphate     REACTION: unspecified   Current Medications:  Current Outpatient Prescriptions  Medication Sig Dispense Refill  . aspirin 81 MG chewable tablet  Chew 81 mg by mouth daily.        Marland Kitchen atorvastatin (LIPITOR) 20 MG tablet Take 1 tablet (20 mg total) by mouth daily.  90 tablet  1  . Garlic [id number] MG TABS Take by mouth.        Marland Kitchen ibuprofen (ADVIL,MOTRIN) 600 MG tablet Take 600 mg by mouth every 8 (eight) hours as needed.        . Multiple Vitamins-Minerals (CENTRUM SILVER PO) Take by mouth.        . nebivolol (BYSTOLIC) 10 MG tablet Take 10 mg by mouth daily. 1/2 daily      . tadalafil (CIALIS) 20 MG tablet Take 20 mg by mouth daily as needed.        Marland Kitchen telmisartan (MICARDIS) 80 MG tablet Take 1 tablet (80 mg total) by mouth daily.  90 tablet  1  . traZODone (DESYREL) 50 MG tablet Take 1 tablet (50 mg total) by mouth at bedtime.  30 tablet  0  . triamterene-hydrochlorothiazide (MAXZIDE-25) 37.5-25 MG per tablet Take 1 tablet by mouth daily.  90 tablet  1  . zolpidem (AMBIEN) 10 MG tablet Take 10 mg by mouth at bedtime as needed.          Previous Psychotropic Medications:  Medication Dose   Ambien  10 mg nightly as needed                     Substance Abuse History in the last 12 months: Substance Age of 1st Use Last Use Amount Specific Type  Nicotine  no       Alcohol  15 02/13/11 5th Wine/liquor  Cannabis  yes        Opiates  1960s 1960s  Herion  Cocaine  yes     Methamphetamines  no        LSD          Ecstasy           Benzodiazepines          Caffeine  yes       Inhalants  no        Others:                          Medical Consequences of Substance Abuse: HTN, insomnia  Legal Consequences of Substance Abuse: None  Family Consequences of Substance Abuse: guilt   Blackouts:  Yes DT's:  No Withdrawal Symptoms:  Yes Nausea Tremors Vomiting  Social History: Current Place of Residence: [place], [place] of Birth: [place], [place] Family Members: Mother, father,brother, sister Marital Status:  Widowed Children: 2 biological, one step daughter  Sons: 1  Daughters: 1 Relationships: girlfriend  Education:   Dentist Problems/Performance: None  Religious Beliefs/Practices: [employer] History of Abuse: none Ship broker History:  Dispensing optician History: None  Hobbies/Interests: Church Family History:   Family History  Problem Relation Age of Onset  . Diabetes Mother   . Hypertension Mother   . Other Mother     nerves  . Throat cancer Father     deceased  . Coronary artery disease Sister   . Diabetes Sister   . Heart disease Sister   . Hypertension Brother     Mental Status Examination/Evaluation: Objective:  Appearance: Well Groomed  Engineer, water::  Good  Speech:  Clear and Coherent  Volume:  Normal  Mood:  euthymic  Affect:  Congruent  Thought Process:  Linear  Orientation:  Full  Thought Content:    Suicidal Thoughts:  No  Homicidal Thoughts:  No  Judgement:  Intact  Insight:  Fair  Psychomotor Activity:  Normal  Akathisia:  No  Handed:      Assets:  Communication Skills Desire for Improvement Financial Resources/Insurance Housing Intimacy Leisure Time Physical Health Resilience Social Support Talents/Skills Transportation Vocational/Educational    Laboratory/X-Ray Psychological Evaluation(s)          AXIS I Post Traumatic Stress Disorder and alcohol dependence  AXIS II No diagnosis  AXIS III Past Medical History  Diagnosis Date  . Hyperlipidemia   . Hypertension   . ED (erectile dysfunction)   . Proteinuria     past record showed 500mg  protein excretion per 24 hours eval by renal  . PTSD (post-traumatic stress disorder)     from [place] experience [place]  . Normal nuclear stress test     myoview neg 12 09 except for HT     AXIS IV None  AXIS V 51-60 moderate symptoms   Treatment Plan/Recommendations:  Plan of Care: Group therapy and medication management  Laboratory:  UDS  Psychotherapy: Group  Medications: Trazodone  Routine PRN Medications:  Yes  Consultations: None  Safety Concerns:  None  Other:       [facility] 11/12/20123:28 PM

## 2011-02-25 NOTE — Progress Notes (Signed)
    Daily Group Progress Note  Program: CD-IOP   Group Time: 1-2:30 pm  Participation Level: Active  Behavioral Response: Appropriate, Sharing and Assertive  Type of Therapy: Psycho-education Group  Topic: The Wheel of Life; an exercise and presentation on the Wheel of Life. A wheel was drawn on the board divided into 8 different segments representing the various aspects of one's life. Members were given a handout and asked to chart where they are relative to each segment in their current lives. By connecting the 8 segments, one is able to see how "smoothly" their wheel would roll. The bumpier one's ride, the more inconsistent one is doing in the various segments. Each member was invited to come to the board, draw his or her wheel and explain why they were where they are in each segment.       Group Time: 2:45-4 pm  Participation Level: Active  Behavioral Response: Appropriate, Sharing and Assertive  Type of Therapy: Process Group  Topic: Group Process; second half of group was spent in process. Members were asked to share their feelings about currently struggles and issues. There was good disclosure with feedback and support provided among the group.    Summary: The patient was beaming during the opening the session and proudly displayed the "starter chip" he had picked up yesterday at a meeting at the [facility]. The patient became very confused later on as one member admitted she had drank and didn't feel like it was a problem. Javell explained that he thought the group was for people who had a problem with drugs or alcohol and were trying to stop. I assured him that this was the case, but in some instances there were issues of ambivalence and even denial. The patient drew his wheel of life and in each instance, when the specific aspects were poor or lacking, it was because ofr his alcohol use. When asked about his upcoming weekend, the patient provided a brief primer on [employer] and explained he would spending all day tomorrow worshiping and not doing anything since Saturday was their day of worship. The patient made some excellent comments and has responded very well in the very brief time he has been in the program.   Family Program: Family present? No   Name of family member(s):  UDS collected: Yes Results:   AA/NA attended?: [place]  Sponsor?: No   Nazaire Cordial, LCAS

## 2011-02-27 ENCOUNTER — Other Ambulatory Visit (HOSPITAL_COMMUNITY): Payer: Medicare Other | Admitting: Psychology

## 2011-02-28 NOTE — Progress Notes (Signed)
    Daily Group Progress Note  Program: CD-IOP   Group Time: 1-2:30 pm  Participation Level: Active  Behavioral Response: Appropriate, Sharing and Assertive  Type of Therapy: Psycho-education Group  Topic:  Post Acute Withdrawal Symptoms. A presentation and handout was provided on PAWS. These are the very common and frequently experienced symptoms in early recovery. They include memory loss, irritability, insomnia, clumsiness and a number of other impairments that are typically experienced in early recovery. The importance of being patient with one's self and realizing that there is nothing wrong is very critical. Members shared their own particular experiences with PAWS and there was not one group member who had not experienced at least 1 of these symptoms.       Group Time: 2:45 - 4 pm  Participation Level: Active  Behavioral Response: Appropriate and Sharing  Type of Therapy: Process Group  Topic: Process group and graduation; second half of session spent in process. Members shared feelings about current struggles and issues in early recovery. One member was successfully completing the program and a graduation ceremony was held as the session came to an end.    Summary: the patient was attentive and engaged actively in group today. He admitted he is feeling good about himself and friends and family are making positive comments on his newfound attitudes in just the few days he has been involved in the program. The patient readily acknowledges his alcoholism and is in group to learn how to remain sober for the remainder of his life. He shared some of his concerns about his "lady friend" and the group challenged him for "mindreading" her thoughts about him. He reported his son has responded very favorably to the changes he has made and asked for an NA meeting schedule to give to his son. The patient spoke kindly towards the graduating member who was preparing to leave the group. The  patient made some excellent comments and responded well to this intervention.    Family Program: Family present? No   Name of family member(s):   UDS collected: No Results:   AA/NA attended?: [place] and Tuesday  Sponsor?: No   Yazir Koerber, LCAS

## 2011-03-01 ENCOUNTER — Other Ambulatory Visit (HOSPITAL_COMMUNITY): Payer: Medicare Other | Admitting: Psychology

## 2011-03-04 ENCOUNTER — Other Ambulatory Visit (HOSPITAL_COMMUNITY): Payer: Medicare Other | Admitting: Psychology

## 2011-03-04 ENCOUNTER — Encounter (HOSPITAL_COMMUNITY): Payer: Self-pay | Admitting: Psychology

## 2011-03-04 ENCOUNTER — Encounter (HOSPITAL_COMMUNITY): Payer: Self-pay | Admitting: *Deleted

## 2011-03-04 NOTE — Progress Notes (Signed)
    Daily Group Progress Note  Program: CD-IOP   Group Time: 1-2:30 pm  Participation Level: Active  Behavioral Response: Appropriate and Sharing  Type of Therapy: Activity Group  Topic: Gentle Yoga: A certified yoga instructor visited the group this afternoon. She led the group om yoga stretches and poses. The group responded well and concurred that they felt relaxed and more open at the conclusion of the session. The purpose of this session was to demonstrate the way one can change their feelings quickly and in a healthy manner. There was good feedback and the group responded well to this intervention     Group Time: 2:45-4pm  Participation Level: Active  Behavioral Response: Appropriate and Sharing  Type of Therapy: Process Group  Topic: Process Group; second half of session spent in process. Members shared their feelings in early recovery. They also discussed their intentions for the weekend and plans to remain abstinent.    Summary: The patient reported he is doing really well and feels good about himself. He noted that his "lady friend" had sent him a text and made him feel even better. Jeremy Black noted that some of his old drinking buddies have phoned him recently and inquired about how he is doing? He has told every one of them that he is not drinking and attending meetings and they have been very supportive of the news. He noted that he is attending [place] meetings at the [employer] and also attends a Tuesday morning PTSD group related to the [place]. All of them focus on recovery and abstinence and the patient seems to be thriving in this new way of living. Jeremy Black engaged in the yoga exercises and agreed to stay aware of his limits based on 2 back surgeries. He made some excellent comments.   Family Program: Family present? No   Name of family member(s):   UDS collected: No Results:   AA/NA attended?: YesMonday, Wednesday and Thursday  Sponsor?: Yes   Braeleigh Pyper,  LCAS

## 2011-03-05 NOTE — Progress Notes (Signed)
    Daily Group Progress Note  Program: CD-IOP   Group Time: 1-2:30 pm  Participation Level: Active  Behavioral Response: Appropriate, Sharing and Assertive  Type of Therapy: Psycho-education Group  Topic:Values: A presentation was made on Values and the way one's addiction "hijacks" one's values. Group members were provided with a handout listing many different values. They were asked to identify their top 3. Members then shared some of the values they had identified. A discussion ensued about the ways in which members abandoned their values while in their active addiction. There was good disclosure and members were very candid about the things they had done in their active addiction that conflicted entirely with what they value.       Group Time: 2:45-4pm  Participation Level: Active  Behavioral Response: Sharing and Resistant  Type of Therapy: Process Group  Topic: Process group; second half of session was spent in process. Members shared their feelings in early recovery. While some complained of emotional lability, others expressed frustration over lack of sleep. One member brought his S/O and she shared how his drinking had impacted her.    Summary: The patient brought his S/O to group today. He introduced her during the check-in. The patient shared that she had been extremely kind to him and taken him to the [place] for detox in June, but that he had relapsed after he returned. He felt very badly that he had chosen the alcohol over her. The patient reported he is working the steps and noted he is on Step 5. When asked if he had a sponsor, the patient reported he didn't need one. This set off a rousing discussion and explanation behind sponsorship and how one cannot work the steps alone. The patient resisted this challenge, but gradually seemed to stop resisting and listened as his fellow group members explained the 12-step concepts. In process, the S/O shared her fears of the patient  when he was drinking and how much he changed when he had been drinking. This session was very effective and enlightening in numerous ways for this patient.      Family Program: Family present? Yes   Name of family member(s): Ruby  UDS collected: No Results:   AA/NA attended?: YesTuesday, Wednesday and Friday  Sponsor?: No   Ezel Vallone, LCAS

## 2011-03-05 NOTE — Progress Notes (Unsigned)
Patient ID: Jeremy Black, male   DOB: November 13, 1945, 65 y.o.   MRN: [id number] Met with patient at the conclusion of his CD-IOP group session today. We had been scheduled to meet last Wednesday, but the patient had to cancel because of a conflict that had arisen. Today i explained the importance of discussing his goals for treatment. The patient was very agreeable to reviewing goals and easily identified remaining alcohol-free as his primary goal of treatment. He insisted that remaining sober was his lifetime goal. He also agreed that building support for his ongoing recovery was also a goal. The patient reminded me that he attends an [place] meeting on Tuesday night, a prayer meeting on Wednesday evening, Choir practice on Thursday evenings and a meeting with an elder on Friday evenings. He also has many supporters and friends who are following his progress in early recovery. Insuring that he does not fall back into his alcoholism is a big concern for him and I reviewed the importance of routine and a daily plan that he will commit to following. All documentation was reviewed and signatures obtained. This treatment planning sessions was completed successfully. Patient will return to group on Wednesday, November 21. He is making excellent progress in his early recovery.

## 2011-03-06 ENCOUNTER — Other Ambulatory Visit (HOSPITAL_COMMUNITY): Payer: Medicare Other | Admitting: Psychology

## 2011-03-08 ENCOUNTER — Other Ambulatory Visit (HOSPITAL_COMMUNITY): Payer: Medicare Other

## 2011-03-11 ENCOUNTER — Other Ambulatory Visit (HOSPITAL_COMMUNITY): Payer: Medicare Other | Admitting: Psychology

## 2011-03-11 DIAGNOSIS — F1920 Other psychoactive substance dependence, uncomplicated: Secondary | ICD-10-CM

## 2011-03-11 NOTE — Progress Notes (Signed)
    Daily Group Progress Note  Program: CD-IOP   Group Time: 1-2:30 pm  Participation Level: Active  Behavioral Response: Appropriate and Sharing  Type of Therapy: Psycho-education Group  Topic: Communication: A presentation was provided on different types of communication styles. A handout was provided with examples of assertive, passive, aggressive and passive-aggressive communication styles. A scenario was read and members acted out 3 different role plays demonstrating the different ways they could communicate. There was good discussion and members identified their typical communication styles, most of which were passive and passive-aggressive. There was good discussion with examples provided and the group reflecting back on their perspectives and experiences.      Group Time: 2:45-4 pm  Participation Level: Active  Behavioral Response: Appropriate, Sharing and Assertive  Type of Therapy: Process Group  Topic: Process Group; second half of session spent in process. Group members shared their feelings and frustrations in early recovery. Discussion about plans for the upcoming holiday and preparations that will support ongoing sobriety.    Summary: The patient admitted that he is probably too passive most of the time and explained that if the other person is happy then he is happy. We discussed the example of telling someone how their behavior hurts you and Issac had a great insight when he understood what his S/O has told him: "When you have been drinking, I feel scared". Kymier could suddenly see how saying it that way made him want to stop drinking even more and protect her. The patient reported he had attended a meeting last night and had gained a much clearer understanding about the purpose and importance of a sponsor. He admitted that the fellows at the meeting had explained to him that he couldn't possibly work the steps without a sponsor and he agreed that he would go back  and start again at Step 1. The patient made some excellent comments and responded well to this intervention.     Family Program: Family present? No   Name of family member(s):   UDS collected: No Results:   AA/NA attended?: [place]  Sponsor?: No   Oberon Hehir, LCAS

## 2011-03-12 NOTE — Progress Notes (Signed)
    Daily Group Progress Note  Program: CD-IOP   Group Time: 1-2:30 pm  Participation Level: Active  Behavioral Response: Appropriate and Sharing  Type of Therapy: Process Group  Topic: Group Process; first half of group was spent in process. Members shared about their holiday weekends and the numerous ways they spent their long holiday weekend. Three members admitted using over the weekend and their "slips" were processed and discussed at length. The contrast in motivation and intention was quite apparent among the group members and while some members attended many meetings over the weekend, others did not go to any meetings or seek out support for their recovery.       Group Time: 2:45-4 pm  Participation Level: Minimal  Behavioral Response: Appropriate  Type of Therapy: Psycho-education Group  Topic: The Neurobiology of Addiction, Part 1. The second half of group was spent watching the DVD, "The Neurobiology of Addiction". It presented a very informative presentation on the brain chemistry of addiction and the chemicals that chemically dependent people are missing or experience reduced levels of. The film was halted numerous times during the presentation and the concepts discussed thoroughly to insure that group members clearly understood the chemical nature of their addiction   Summary: The patient reported he had had a wonderful holiday weekend and remains sober. He noted that he had been very busy and spent a lot of time with his friends and his S/O. When discussing the video, he pointed out that he is not ashamed or embarrassed to admit to others that he is in treatment to address his alcoholism. I pointed out to the group that the the patient's lack of shame effects the way others respond to him while for some people, they are so ashamed and feel so guilty that they almost perpetuate the shame felt by others. The patient is making excellent progress and made some good comments.     Family Program: Family present? No   Name of family member(s):   UDS collected: Yes Results:   AA/NA attended?: [place] and Tuesday  Sponsor?: No   Charlesetta Milliron, LCAS

## 2011-03-13 ENCOUNTER — Other Ambulatory Visit (HOSPITAL_COMMUNITY): Payer: Medicare Other | Admitting: Psychology

## 2011-03-14 NOTE — Progress Notes (Unsigned)
    Daily Group Progress Note  Program: CD-IOP   Group Time: 1-2:30 pm  Participation Level: minimal  Behavioral Response: patient displayed little interest in the discussion and shared nothing of himself  Type of Therapy: Psycho-education Group  Topic: Pharmacist: The first half of group was spent with the [facility] Pharmacist, Einar Grad. She engaged the group in a discussion on different medications for various illnesses and the specific issues and complications most typical in the chemically dependent population. Group members asked many questions, especially as relates to mood disorders and sleeplessness. There was good interaction and exchange and the session went well.      Group Time: 2:45-4 pm  Participation Level: Active  Behavioral Response: Appropriate and Sharing  Type of Therapy: Process Group  Topic: Group Process and Graduation: second half of group spent in process. There were 2 new group members present and they shared about their lives in addiction and what they are wanting from the group. Near the conclusion of the session, a graduation ceremony was held for a graduating member.    Summary: The patient did not appear interested in the presentation by the pharmacist. During this session, though, there was a fire drill and everyone followed instructions and went outside the bldg. During this break, the pharmacist introduced herself to the patient and he shared about his PTSD diagnosis and treatment at the [place]. She made some recommendations and the patient responded eagerly to her suggestions     Family Program: Family present? {BHH YES OR NO:22294}   Name of family member(s): ***  UDS collected: {BHH YES OR NO:22294} Results: {Findings; urine drug screen:60936}  AA/NA attended?: {BHH YES OR NO:22294}{DAYS OF AP:[id number]  Sponsor?: {BHH YES OR NO:22294}   Haly Feher, LCAS

## 2011-03-15 ENCOUNTER — Other Ambulatory Visit (HOSPITAL_COMMUNITY): Payer: Self-pay | Admitting: Physician Assistant

## 2011-03-15 ENCOUNTER — Other Ambulatory Visit (HOSPITAL_COMMUNITY): Payer: Medicare Other | Admitting: Psychology

## 2011-03-15 DIAGNOSIS — F1020 Alcohol dependence, uncomplicated: Secondary | ICD-10-CM

## 2011-03-15 MED ORDER — MIRTAZAPINE 15 MG PO TABS: ORAL_TABLET | ORAL | Status: DC

## 2011-03-18 ENCOUNTER — Other Ambulatory Visit (HOSPITAL_COMMUNITY): Payer: Medicare Other | Admitting: Psychology

## 2011-03-18 DIAGNOSIS — I10 Essential (primary) hypertension: Secondary | ICD-10-CM | POA: Insufficient documentation

## 2011-03-18 DIAGNOSIS — E669 Obesity, unspecified: Secondary | ICD-10-CM | POA: Insufficient documentation

## 2011-03-18 DIAGNOSIS — M19049 Primary osteoarthritis, unspecified hand: Secondary | ICD-10-CM | POA: Insufficient documentation

## 2011-03-18 DIAGNOSIS — E785 Hyperlipidemia, unspecified: Secondary | ICD-10-CM | POA: Insufficient documentation

## 2011-03-18 DIAGNOSIS — F1920 Other psychoactive substance dependence, uncomplicated: Secondary | ICD-10-CM | POA: Insufficient documentation

## 2011-03-18 DIAGNOSIS — G4700 Insomnia, unspecified: Secondary | ICD-10-CM | POA: Insufficient documentation

## 2011-03-18 NOTE — Progress Notes (Signed)
    Daily Group Progress Note  Program: CD-IOP   Group Time: 1-3:30 pm  Participation Level: Active  Behavioral Response: Appropriate  Type of Therapy: Activity Group  Topic: The session today was spent watching the movie, "Flight", with [employer], in a local theatre. 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 The film and main actor clearly displayed the narcissistic thinking and behavior of the addict. It also showed the consequences of continued alcohol and drug use. This session represented an opportunity to educate patients more about addiction, but also about opportunities to have fun and enjoy one's self living sober.      Group Time: 3:30 - 4 pm  Participation Level: Active  Behavioral Response: Appropriate  Type of Therapy: Process Group  Topic: At the conclusion of the film, group members and therapists gathered in a private room to discuss the film. Members shared their feelings about the film and identified triggers or other instances where they experienced powerful emotions. The discussion included talking about ways to deal with cravings and the importance of "playing the tape out to the end". The session today proved enlightening for the group and an opportunity not experienced in many years for a number of group members.   Summary: The patient had attended the film the week before, but had agreed to attend again with the group. He pointed out how he was able to relate to many of the main characters actions when drunk. The patient felt he could see himself in the main character. He made some good comments and responded well to this intervention.    Family Program: Family present? No   Name of family member(s):   UDS collected: No Results:   AA/NA attended?: YesMonday, Tuesday and Wednesday  Sponsor?: No   Kateria Cutrona, LCAS

## 2011-03-19 NOTE — Progress Notes (Signed)
    Daily Group Progress Note  Program: CD-IOP   Group Time: 1-2:30 pm  Participation Level: Active  Behavioral Response: Appropriate and Sharing  Type of Therapy: Process Group  Topic: Group Process: Second half of group was spent in process. Members checked in and shared their experiences and feelings since they were last in group. One member admitted that he had relapsed over the weekend and a significant amount of time was spent processing the relapse. The group agreed that one must have a plan for the day and, in some instances, make a commitment to another in recovery to make sure they are following a plan. There was good disclosure and discussion among group members.      Group Time: 2:45-4 pm  Participation Level: Active  Behavioral Response: Appropriate  Type of Therapy: Psycho-education Group  Topic: Cognitive Distortions: second half of group was spent identifying and discussing cognitive distortions. The idea that a thought comes before a feeling or emotion was emphasized and members discussed specific scenarios in their past trying to identify the distortion that they hold true and how it affects their feelings and subsequent behaviors.  A handout was provided with 10 very common distortions and the members provided examples of these distortions in their own lives.       Summary: The patient reported he had had a good weekend and had gone bowling with his S/O on Saturday evening. He admitted that is mother had tried to manipulate him and he became very upset when he realized she had been lying. The patient reminded the group, "I have no reason to lie". The patient reported he had found the film on Friday, "Flight" to be a version of his life and he pointed out that he had fallen on the coffee table and broke it when he was drunk just like the main character in the film. The patient continues to make good progress in his recovery and he responded well to this  intervention.    Family Program: Family present? No   Name of family member(s):   UDS collected: Yes Results:   AA/NA attended?: YesMonday, Tuesday and Wednesday  Sponsor?: No   Jawana Reagor, LCAS

## 2011-03-20 ENCOUNTER — Other Ambulatory Visit (HOSPITAL_COMMUNITY): Payer: Medicare Other | Admitting: Psychology

## 2011-03-20 DIAGNOSIS — F1920 Other psychoactive substance dependence, uncomplicated: Secondary | ICD-10-CM

## 2011-03-21 LAB — DRUG SCREEN, URINE
Barbiturate Quant, Ur: NEGATIVE
Benzodiazepines.: NEGATIVE
Marijuana Metabolite: NEGATIVE
Methadone: NEGATIVE
Opiates: NEGATIVE
Propoxyphene: NEGATIVE

## 2011-03-21 NOTE — Progress Notes (Signed)
    Daily Group Progress Note  Program: CD-IOP   Group Time: 1-2:30 pm  Participation Level: Active  Behavioral Response: Appropriate and Sharing  Type of Therapy: Psycho-education Group  Topic: The Five Love Languages:  A presentation was provided on the [employer]. A questionnaire was handed out and members were asked to complete the 30 questions on the handout. Upon completion, members shared their totals and discussed which of the Five Languages, the felt most comfortable with and validated. There was good discussion and disclosure among the group. The importance of knowing how one feels best or most loved was emphasized. Members were also encouraged to insure that their S/O is aware of their feelings so they can be validated in the manner or language that they most respond to.      Group Time: 2:45-4 pm  Participation Level: Active  Behavioral Response: Appropriate and Sharing  Type of Therapy: Process Group  Topic: Group Process: Second half of group was spent in process. Members shared their current struggles and concerns. Some members noted that they had been paying more attention to their thoughts and beliefs and were trying to better understand their feelings. This heralded back to a previous session focusing on how one's thoughts produce feelings. There was good disclosure and feedback provided by group members.   Summary: The patient reported he was very sore today and had gone to the gym yesterday. He enjoyed the presentation on the different love languages and reported that he had scored high in quality time and physical contact. He asked for another handout so he could have his S/O complete the exercise and they could compare notes. Jeremy Black reported he really loves coming to this group and sharing his feelings and wondered whether he could come back after he graduates? I explained the importance of building a network in the community so he does have structure and  routine when he leaves here. In process, the patient admitted his mother drives him "crazy" sometimes, and he has to pray to God to provided him with patience to get through these trying times. The patient has responded very well to his treatment and is making excellent progress in his recovery from alcohol dependence.   Family Program: Family present? No   Name of family member(s):   UDS collected: Yes Results:   AA/NA attended?: YesMonday, Tuesday and Wednesday  Sponsor?: No   Jorgia Manthei, LCAS

## 2011-03-22 ENCOUNTER — Other Ambulatory Visit (HOSPITAL_COMMUNITY): Payer: Medicare Other | Admitting: Psychology

## 2011-03-25 ENCOUNTER — Other Ambulatory Visit (HOSPITAL_COMMUNITY): Payer: Medicare Other | Admitting: Psychology

## 2011-03-25 DIAGNOSIS — F1920 Other psychoactive substance dependence, uncomplicated: Secondary | ICD-10-CM

## 2011-03-25 NOTE — Progress Notes (Signed)
    Daily Group Progress Note  Program: CD-IOP   Group Time: 1-2:30 pm  Participation Level: Active  Behavioral Response: Appropriate and Sharing  Type of Therapy: Psycho-education Group  Topic: Sleep Hygiene and Self Care: A presentation was provided on the topic of Sleep Hygiene.  A handout was given to all members identifying the 5 stages of sleep and detailed descriptions of each stage of sleep. The discussion went on to discuss general concepts around self-care and the various elements of recovery. There was good discussion about personal issues around sleep and the ways one's active addiction impacted their sleep. One member who had relapsed last weekend admitted his sleep is still very disrupted. There was good feedback and disclosure during this presentation.     Group Time: 2:45- 4 pm  Participation Level: Active  Behavioral Response: Appropriate and Sharing  Type of Therapy: Process Group  Topic: Group Process: Members discussed issues they are current dealing with in early recovery. Included in this part of group was a guided relaxation exercise. The purpose of this exercise was to emphasize the importance of group members staying balanced and focused, in part, through relaxation and meditation exercises. A new member was present and she introduced herself and answered numerous questions from fellow group members asking about her history of drugs and degree of acceptance of her chemical dependency.    Summary: The patient reported he had given his S/O the Five Love Languages questionnaire and her answers were different then he had anticipated. He noted that while he values physical touch, she values gifts. The group laughed and encouraged him to provide her with gifts. Jeremy Black reported he has accepted that he will be attending meetings and working on his recovery for the rest of his life. He has made peace with this idea. He asked some good questions of the new group member.  He shared that the new medication the medical director had prescribed has "kicked his but", butt", but upon further questioning, he admitted that he slept well and he was pleased with the sleep medication. This patient is a breath of fresh air to the group and he made some excellent comments.   Family Program: Family present? No   Name of family member(s):   UDS collected: No  Results:   AA/NA attended?: YesMonday, Tuesday and Wednesday  Sponsor?: No   Jeremy Black, LCAS

## 2011-03-26 LAB — DRUG SCREEN, URINE
Barbiturate Quant, Ur: NEGATIVE
Creatinine,U: 90.3 mg/dL
Methadone: NEGATIVE
Opiates: NEGATIVE
Phencyclidine (PCP): NEGATIVE
Propoxyphene: NEGATIVE

## 2011-03-26 NOTE — Progress Notes (Signed)
    Daily Group Progress Note  Program: CD-IOP   Group Time: 1-2:30 pm  Participation Level: Active  Behavioral Response: Appropriate and Sharing  Type of Therapy: Psycho-education Group  Topic: Chaplain: the first part of group was spent with a visiting chaplain, Luz Brazen. She introduced herself and group members introduced themselves. Mardene Celeste read from a book she had brought. It focused on the pain that an oyster goes through to build a pearl. The overriding emphasis was on the suffering and how our losses and pain become a part of [place]. The holiday season and particular difficulties that Christmas represents was discussed. Members shared about their own losses and how they have been transformed by the pain and suffering.      Group Time: 2:45-4 pm  Participation Level: Active  Behavioral Response: Appropriate  Type of Therapy: Process Group  Topic: Group Process; second half of group was spent in process. Members discussed their feelings and current struggles. Some talked about how they are doing in recovery while one member recounted a very close call he had had over the weekend. There was good disclosure and feedback among the group.     Summary: The patient was attentive and engaged actively in the discussion with the chaplain. Tarron talked about his wife of 40+ years dying of cancer this past year. He admitted that he felt guilty about many things that he had not done to help her or provide comfort as she was dying of cancer. He reported that he was drinking heavily and not supportive. He shared how he has changed dramatically since joining this program and attending [place] meetings. He noted how his daughter had validated his changes and how proud he had felt. He agreed that he cannot do anything to change the past, but he is working on changing his life today and going forward. He emphasized his faith and the important support he receives from the congregation. Mikelle made  some good comments and responded well to this intervention.    Family Program: Family present? No   Name of family member(s):   UDS collected: Yes Results:  AA/NA attended?: YesMonday, Tuesday and Wednesday  Sponsor?: No   Denaya Horn, LCAS

## 2011-03-27 ENCOUNTER — Other Ambulatory Visit (HOSPITAL_COMMUNITY): Payer: Medicare Other | Admitting: Psychology

## 2011-03-29 ENCOUNTER — Other Ambulatory Visit (HOSPITAL_COMMUNITY): Payer: Medicare Other

## 2011-03-29 NOTE — Progress Notes (Signed)
    Daily Group Progress Note  Program: CD-IOP   Group Time: 1-2:30 pm  Participation Level: Active  Behavioral Response: Appropriate and Sharing  Type of Therapy: Psycho-education Group  Topic:The Pro's and Con's of Drug Use VS Sobriety First half of group was spent identifying the pros and cons of "using" versus abstinence. Members quickly identified many benefits derived through chemicals as well as many positive aspects of remaining alcohol and drug-free. One member pointed out that while the Pro's of Sobriety were very concrete, the Pro's of chemical use were more theoretical and not necessarily tangible and certainly not measurable. There were far more benefits of sobriety as opposed to those for continued use. There was good discussion during this session.      Group Time: 2:45-4 pm  Participation Level: Active  Behavioral Response: Appropriate  Type of Therapy: Process Group  Topic: Group Process and Graduation: second half of group spent in process. A new group member was present and she introduced herself and told her "story" in this part of group. She was very articulate and open about her life over the past 4 years and displayed a very good understanding of CD since returning from a month of inpatient at [facility] in [place]. Near the conclusion of the session, a graduation ceremony was observed for a group member successfully completing the program. There were kind and hopeful thoughts and feelings expressed and tears of joy for this woman. The session concluded with members sharing their plans for the weekend and how they intended to remain alcohol and drug-free.    Summary: the patient described an episode with his 59 yo mother with whom he lives and cares for. He noted that she had become very resistant and he had decided to call the police. The persuaded her to go with her son to her doctor's appointment and everything turned out okay. Jess pointed out that he  prays for patience constantly in dealing with his mother who pushes all of his buttons. Hebron identified many more cons than pros of drug use and he emphasized how much better his life is without alcohol. Boleslaw had some kind words for the graduating member and he responded well to this intervention.    Family Program: Family present? No   Name of family member(s):   UDS collected: No Results:   AA/NA attended?: YesMonday, Tuesday and Wednesday  Sponsor?: No   Melah Ebling, LCAS

## 2011-04-01 ENCOUNTER — Other Ambulatory Visit (HOSPITAL_COMMUNITY): Payer: Medicare Other | Admitting: Psychology

## 2011-04-02 ENCOUNTER — Other Ambulatory Visit: Payer: Self-pay | Admitting: Internal Medicine

## 2011-04-02 NOTE — Progress Notes (Signed)
    Daily Group Progress Note  Program: CD-IOP   Group Time: 1-2:30 pm  Participation Level: Active  Behavioral Response: Sharing  Type of Therapy: Psycho-education Group  Topic:The Disease Concept of Addiction: first half of group spent presenting the disease concept of addiction. The idea was emphasized that once one has become chemically dependent, it is imperative that they remain free of all mind-altering chemicals. One patient wondered if this meant she couldn't smoke pot any more? I explained what we see happen when one stops using their primary drug of addiction, but continue to use others substances. Typically, they become addicted to the other substance or it brings them back to their primary drug of addiction. Bu the time this session ended, each member understood the importance of total abstinence in recovering from chemical dependency.       Group Time: 2:45- 4 pm  Participation Level: Active  Behavioral Response: Appropriate  Type of Therapy: Process Group  Topic: Group Process: second half of group was spent in process. Members shared their current feelings and frustrations in early recovery. One member had relapsed over the weekend and admitted she had been questioning whether she was really an alcoholic. There were 2 new group members and they introduced themselves in this half of group. Both had experienced significant losses and attributed their grief and pain to an increase in their use of chemicals. The session went well with good feedback among the group.     Summary:The patient reported he had had a very busy weekend and was doing well. He described the ways he has dealt with cravings, especially when his mother pushes his buttons and he wants to drive to the [facility] store and drink. Robertjames emphasized the importance of prayer and faith along with the pleasure he derives from being in group. He made some good comments.     Family Program: Family present? No    Name of family member(s):   UDS collected: No Results:  AA/NA attended?: YesMonday, Tuesday and Wednesday  Sponsor?: No   Luigi Stuckey, LCAS

## 2011-04-03 ENCOUNTER — Other Ambulatory Visit (HOSPITAL_COMMUNITY): Payer: Medicare Other | Admitting: Psychology

## 2011-04-03 DIAGNOSIS — F1920 Other psychoactive substance dependence, uncomplicated: Secondary | ICD-10-CM

## 2011-04-04 LAB — DRUG SCREEN, URINE
Barbiturate Quant, Ur: NEGATIVE
Creatinine,U: 94.7 mg/dL
Methadone: NEGATIVE
Opiates: NEGATIVE

## 2011-04-04 NOTE — Progress Notes (Signed)
    Daily Group Progress Note  Program: CD-IOP   Group Time: 1-2:30 pm  Participation Level: Active  Behavioral Response: Appropriate  Type of Therapy: Psycho-education Group  Topic: Building a Recovery Plan: first half of group included a presentation on basic recovery concepts and how to build a daily recovery plan. Group members participated in the discussion and identified the many different issues one must address in early recovery. There was good disclosure with emphasis on being open and honest and reaching out for help. The importance of meetings and getting to know other people in recovery was noted. Members shared about different meetings they find particularly helpful and they encouraged the newer members, who are still hesitant, to go to meetings.      Group Time: 2:45-4 pm  Participation Level: Active  Behavioral Response: Sharing  Type of Therapy: Process Group  Topic: Group Process: Second half of group was spent in process. Members discussed current issues and struggles they are dealing with. There was good feedback with a number of members encouraging the importance of spirituality in their daily life.     Summary: The patient was engaged in the presentation on "early recovery" and reported he has developed a very strong routine in his daily life. Marshell described his daily ritual as waking up, saying a prayer (which he recited to the group), reading the "Thought for the Day', taking his medication and then getting up to start his day. He reported that while he used to drive to the [facility] store when his mother got on his nerves, now he drives in the car, says a prayer, rubs his chest and finds his craving gone. The patient described the importance of working the Steps for another member and emphasized that they must be worked in the order they are written. The patient made some excellent comments and responded well to this intervention.   Family Program: Family  present? No   Name of family member(s):   UDS collected: Yes Results: negative  AA/NA attended?: YesMonday, Tuesday and Wednesday  Sponsor?: No   Haroon Shatto, LCAS

## 2011-04-05 ENCOUNTER — Other Ambulatory Visit (HOSPITAL_COMMUNITY): Payer: Medicare Other | Admitting: Psychology

## 2011-04-06 ENCOUNTER — Other Ambulatory Visit: Payer: Self-pay | Admitting: Internal Medicine

## 2011-04-08 ENCOUNTER — Other Ambulatory Visit (HOSPITAL_COMMUNITY): Payer: Medicare Other

## 2011-04-10 ENCOUNTER — Other Ambulatory Visit (HOSPITAL_COMMUNITY): Payer: Medicare Other | Admitting: Psychology

## 2011-04-10 NOTE — Progress Notes (Signed)
    Daily Group Progress Note  Program: CD-IOP   Group Time: 1-2:30 pm  Participation Level: Active  Behavioral Response: Appropriate and Sharing  Type of Therapy: Psycho-education Group  Topic: Role-Playing Refusal Skills: first part of group spent role-playing. Two group members were teamed up and one tried to persuade the other to join in alcohol or drug use. The member was encouraged to use all sorts of persuasion to coax the other to use. The only thing the other member could do was say "No".  The role-play lasted 3 minutes. Members shared their experiences at the conclusion of the practice. They admitted it was very tempting to join in on the chemical use and frustrating to only be able to say "No". The importance of practicing one's reactions in case they get into difficult awkward situations was emphasized. Group members agreed that practicing various responses based on the specific scenario would be helpful.     Group Time: 2:45- 4 pm  Participation Level: Active  Behavioral Response: Sharing and Assertive  Type of Therapy: Process Group  Topic: Group Process and Graduation: the second half of group was spent in process. Members shared about their current difficulties and frustrations in early recovery. As the session neared the end, a graduation ceremony was held to honor a group member who was completing the program this afternoon. There were very kind words shared by the group and the graduating member offered hope and encouragement to his fellow members.    Summary: The patient seemed steadfast in his commitment to sobriety as he repeated "No" to the group member who was trying to entice him in drinking. Later Olga Millers reported he didn't feel like he was going to give in and hadn't even considered drinking. The patient reported he reminds himself of how important his sobriety has become to him and how his family and loved one's have responded so positively to the changes he  has made in his daily life. The patient shared some kind observations to his fellow group member who is graduating. He responded well to this intervention.   Family Program: Family present? No   Name of family member(s):   UDS collected: No Results:   AA/NA attended?: YesMonday, Tuesday and Wednesday  Sponsor?: No   Liahna Brickner, LCAS

## 2011-04-11 NOTE — Progress Notes (Signed)
    Daily Group Progress Note  Program: CD-IOP   Group Time: 1-2:30 pm  Participation Level: Active  Behavioral Response: Appropriate and Sharing  Type of Therapy: Psycho-education Group  Topic: Slips: what to do, what could have been avoided, and how to get back on track. The first part of group was spent discussing slips. Three members shared that they had relapsed over the holiday weekend. The group listened and asked questions as  each member shared, in detail, the events leading up to their drinking or drugging. In each instance, members identified various strategies that could have been employed had they sought to avoid use. The significance of the holiday and in one case, a birthday anniversary, was identified as a potential trigger and each were able to point out some "stinking thinking" that preceded the slips. There was good discussion and disclosure by group members.       Group Time: 2:45-4 pm  Participation Level: Active  Behavioral Response: Appropriate and Sharing  Type of Therapy: Process Group  Topic:Group Process: the second half of group was spent in process. Each member shared about their current struggles in early recovery. One member shared about his family's intentions to have him enter "[facility]" and the group processed the resistance along with pros and cons of going into this Christian-based, 7 month program. Another member admitted he needed to make plans in order to remain sober for the remainder of today and 2 group members made plans with him once they left this facility. There was open discussion and events and plans were discussed in a critical, but respectful manner.     Summary: The patient reported he had had a great holiday and was not tempted and didn't consider drinking. He provided good feedback to his fellow group members and when discussing the group member's options with the [place] encouraged him to attend the program. He  pointed out that he will have to do things he would otherwise not normally do and he must be challenged if he is to conquer his addiction. Ibn also pointed out that this young man was a very "good talker" and could probably talk his way out of anything. For that very reason alone, Yaniv encouraged him to go to the program for 7 months. The patient made some excellent comments and responded well to this intervention.    Family Program: Family present? No   Name of family member(s):   UDS collected: No Results:   AA/NA attended?: YesMonday, Tuesday and Wednesday  Sponsor?: No   Jovana Rembold, LCAS

## 2011-04-12 ENCOUNTER — Other Ambulatory Visit (HOSPITAL_COMMUNITY): Payer: Medicare Other | Attending: Physician Assistant | Admitting: Psychology

## 2011-04-15 ENCOUNTER — Other Ambulatory Visit (HOSPITAL_COMMUNITY): Payer: Medicare Other | Admitting: Psychology

## 2011-04-15 DIAGNOSIS — F1920 Other psychoactive substance dependence, uncomplicated: Secondary | ICD-10-CM

## 2011-04-15 NOTE — Progress Notes (Signed)
    Daily Group Progress Note  Program: CD-IOP   Group Time: 1-2:30 pm  Participation Level: Active  Behavioral Response: Appropriate and Sharing  Type of Therapy: Activity Group  Topic: The first half of group consisted of an activity. Members were asked to list 3 things about themselves on a small sheet of paper. Of those 3 things only 1 was true. The slips of paper were placed in a basket and a member drew 1 sheet out and then read it to the fellow group members. The group was challenged to identify who the Pryor Curia was and which of the 3 statements was the true one.  The activity proved very challenging and, in most instances, it took numerous times to determine the author. The purpose of the activity was to invite disclosure about one's self, typically about unusual elements or experiences, and promote a greater feeling of closeness among group members. At the conclusion of the exercise, members commented on how much they had enjoyed the activity and getting to know each other better.      Group Time: 2:45- 4 pm  Participation Level: Active  Behavioral Response: Appropriate  Type of Therapy: Process Group  Topic: The second half of group was spent in process. Members discussed their current feelings in early recovery. One member shared about her roommate and the roommate's boyfriend, who continue to bring alcohol into the apartment despite the group member's instructions to remain an alcohol and drug-free home. The group offered feedback about how to address this problem and agreed to come to the home later this weekend if she needed help moving the roommate out. There was good support and validation provided. I instructed group members to formulate plans for the coming New Year's holiday and emphasized that they make commitments and become accountable to others in recovery.    Summary: The patient laughed and engaged actively in the exercise. The group kept [place], but he  continued to deny their efforts and it took quite a long time for theto figure out which of the lists was the patient's. He reported he has done so well in his recovery and the response by family and friends has only solidified his determination. The patient continues to make excellent progress in his recovery and provided excellent feedback to his fellow group members.     Family Program: Family present? No   Name of family member(s):   UDS collected: No Results:   AA/NA attended?: YesMonday, Tuesday and Wednesday  Sponsor?: No   Calder Oblinger, LCAS

## 2011-04-16 LAB — DRUG SCREEN, URINE
Barbiturate Quant, Ur: NEGATIVE
Benzodiazepines.: NEGATIVE
Methadone: NEGATIVE
Propoxyphene: NEGATIVE

## 2011-04-17 ENCOUNTER — Other Ambulatory Visit (HOSPITAL_COMMUNITY): Payer: Medicare Other | Attending: Physician Assistant | Admitting: Psychology

## 2011-04-17 DIAGNOSIS — R1310 Dysphagia, unspecified: Secondary | ICD-10-CM | POA: Insufficient documentation

## 2011-04-17 DIAGNOSIS — F1020 Alcohol dependence, uncomplicated: Secondary | ICD-10-CM | POA: Insufficient documentation

## 2011-04-17 DIAGNOSIS — E669 Obesity, unspecified: Secondary | ICD-10-CM | POA: Insufficient documentation

## 2011-04-17 DIAGNOSIS — M19049 Primary osteoarthritis, unspecified hand: Secondary | ICD-10-CM | POA: Insufficient documentation

## 2011-04-17 DIAGNOSIS — G4700 Insomnia, unspecified: Secondary | ICD-10-CM | POA: Insufficient documentation

## 2011-04-17 DIAGNOSIS — E785 Hyperlipidemia, unspecified: Secondary | ICD-10-CM | POA: Insufficient documentation

## 2011-04-17 DIAGNOSIS — F1920 Other psychoactive substance dependence, uncomplicated: Secondary | ICD-10-CM

## 2011-04-17 DIAGNOSIS — I10 Essential (primary) hypertension: Secondary | ICD-10-CM | POA: Insufficient documentation

## 2011-04-17 NOTE — Progress Notes (Signed)
    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 One member's mother was in group today and she talked about the last 5 years and the ongoing problems her daughter has had. She admitted that there are times when she and her husband are confused and don't know what to believe. Other members shared about their own difficulties with family and loved ones and how painful it can be when trust is gone. There was a long debate about one group member and her roommate who had been using alcohol and drugs and what was to be done about it. Most group members emphasized the need to kick the roommate out whereas others felt like she could be given another chance. There was good disclosure among group members.       Group Time: 2:45-4 pm  Participation Level: Active  Behavioral Response: Appropriate  Type of Therapy: Process Group  Topic: Group Process: second half of group spent in process. Members discussed current issues and concerns. one member admitted he had actually used on Wednesday evening after a heated argument with his mother and step-father. He reported that after the argument he had gone out and used morphine. He later realized that he really did need help. After each member shared their plans for the New Year's Evening, the session came to an end.   Summary: The patient reported he has been doing well. He was accompanied today by his S/O, Jeremy Black. She informed the group that Jeremy Black had been baptized on Saturday. It was a powerful experience, they both agreed. Jeremy Black reported he is so pleased and happy about how everyone has responded to him and the changes he has made, that he has no intention of doing anything different. He reminded the  group that the 12 Steps are meant to be worked over and over and he intends to continue to practice his recovery for the remainder of his life. The patient responded well to this intervention.   Family Program: Family present? No   Name of family member(s):   UDS collected: No Results: not available  AA/NA attended?: YesMonday, Tuesday and Wednesday  Sponsor?: No   Anthony Tamburo, LCAS

## 2011-04-18 LAB — DRUG SCREEN, URINE
Benzodiazepines.: NEGATIVE
Creatinine,U: 77.5 mg/dL
Methadone: NEGATIVE
Opiates: NEGATIVE
Phencyclidine (PCP): NEGATIVE
Propoxyphene: NEGATIVE

## 2011-04-19 ENCOUNTER — Other Ambulatory Visit (HOSPITAL_COMMUNITY): Payer: Medicare Other | Admitting: Psychology

## 2011-04-22 ENCOUNTER — Other Ambulatory Visit (HOSPITAL_COMMUNITY): Payer: Medicare Other | Admitting: Psychology

## 2011-04-22 NOTE — Progress Notes (Signed)
    Daily Group Progress Note  Program: CD-IOP   Group Time: 1-2:30 pm  Participation Level: Minimal  Behavioral Response: Appropriate  Type of Therapy: Psycho-education Group  Topic: Guest Speaker: the first half of group today was spent with a guest speaker. This woman had graduated from the CD-IOP program here at [facility] approximately 5 months ago and had obtained almost 8 months of sobriety. She shared about herself and struggles in addiction. She described how she has remained alcohol and drug-free and what she does on a daily basis to remain that way.      Group Time: 2:45- 4 pm  Participation Level: Active  Behavioral Response: Appropriate and Sharing  Type of Therapy: Process Group  Topic: Group Process; the second half of group was spent in process. Members discussed current struggles and issues in early recovery. One member admitted she has not been doing what she needs to do to remain sober and stated that she wanted to be like the guest speaker and have almost 8 months of sobriety. Another shared some of her concerns in returning to college next week. There was good disclosure and support among group members.     Summary:The patient was attentive, but spoke little during the guest speaker's presentation. In process, he admitted that he admired her determination and praised her for having made some many changes in her life. Marcelle provided encouragement to his fellow group member about her need to make changes and become more engaged in her life. Calletano described his typical week day and pointed out that he is doing something 5 out of 7 nights, including recovery meetings, church, Therapist, music and devotion. The patient explained that once this program is done, he will have to do something to fill the 9 hours per week that this group has taken up. The patient continues to make excellent progress in his recovery and provided excellent feedback to his fellow group members.     Family Program: Family present? No   Name of family member(s):   UDS collected: No Results:}  AA/NA attended?: YesMonday, Tuesday and Wednesday  Sponsor?: No   Brylee Berk, LCAS

## 2011-04-23 NOTE — Progress Notes (Signed)
    Daily Group Progress Note  Program: CD-IOP   Group Time: 1-2:30 pm  Participation Level: Active  Behavioral Response: Appropriate and Sharing  Type of Therapy: Psycho-education Group  Topic: "Making New Friends". A handout was provided asking group members to identify the type of friends they have, the difference between friends and acquaintances and other issues around friendship. They completed the questions and a discussion ensued about friends and what good friends really represent for one another. The emphasis of this exercise was to begin identifying ways to make new friends and how to meet them. The importance of the 12-step community was emphasized and members shared the relationships that have developed since they entered recovery. The exercise also brought out the difficulties and frustrations a number of members are having with their current friends and their refusal or lack of support for abstinence. There was good disclosure among the group and some members questioned why other members continued to spend time with people that were not supportive of their efforts.     Group Time: 2:45- 4 pm  Participation Level: Active  Behavioral Response: Appropriate  Type of Therapy: Process Group  Topic: Group Process: the second half of group was spent in process. Members shared about their current frustrations and issues in early recovery. There was a new group member today and he introduced himself to the group. There was good discussion among the group. *   Summary: The patient was able to identify a number of good friends. He also provided the difference between a friend and an acquaintance. He reported he continues to be very busy and pointed out that when he has a cancellation in one area, he will intentionally fill it with something else. He emphasized the importance of keeping busy or occupied and noted boredom has gotten him into a lot of trouble. The patient asked another  member about whether she is holding on to a toxic boyfriend because she is afraid to be alone? This question generated some good disclosure and comments. The patient continues to make excellent progress in his recovery and he responded well to this intervention.    Family Program: Family present? No   Name of family member(s):   UDS collected: No Results:   AA/NA attended?: YesMonday, Tuesday and Wednesday  Sponsor?: No   Naylin Burkle, LCAS

## 2011-04-24 ENCOUNTER — Other Ambulatory Visit (HOSPITAL_COMMUNITY): Payer: Medicare Other | Admitting: Psychology

## 2011-04-24 DIAGNOSIS — F1920 Other psychoactive substance dependence, uncomplicated: Secondary | ICD-10-CM

## 2011-04-25 LAB — DRUG SCREEN, URINE
Barbiturate Quant, Ur: NEGATIVE
Cocaine Metabolites: NEGATIVE
Creatinine,U: 99.56 mg/dL
Phencyclidine (PCP): NEGATIVE
Propoxyphene: NEGATIVE

## 2011-04-26 ENCOUNTER — Other Ambulatory Visit (HOSPITAL_COMMUNITY): Payer: Medicare Other | Admitting: Psychology

## 2011-04-26 NOTE — Progress Notes (Signed)
    Daily Group Progress Note  Program: CD-IOP   Group Time: 1-2:30 pm  Participation Level: Active  Behavioral Response: Appropriate  Type of Therapy: Psycho-education Group  Topic:Roles in the Addicted Family System: education was provided on the characteristics of the addictive family system, including the different roles taken on by family members.  The five different roles, including the addicted family member, were identified and discussed at length. Members were asked about their own families of origin and what roles they may have played. At least 3 members were able to identify the roles they played while growing up in the dysfunctional family system. There was good disclosure and discussion about the extraordinary things people will do to maintain equilibrium within the family system.      Group Time: 2:45- 4 pm  Participation Level: Active  Behavioral Response: Appropriate and Sharing  Type of Therapy: Process Group  Topic: Group Process and Graduation: the second half of group was spent in process. Members shared some of their current struggles and frustrations. As the session neared the end, a graduation ceremony was held honoring a group member graduating successfully from the program today. There were kind words expressed to this young man who has been a very positive member with excellent insight and feedback for his fellow members.    Summary: The patient was attentive and discussed his own family dynamics. Venancio reminded the group that his mother had always been very jealous of his other relationships, particularly male ones. Durl reported he has been spending a lot of time with his S/O and admitted he thinks they are spending too much time together. He needs some other things to keep him occupied and noted that he will have to do something with his time once he completes this program. The patient had some very validating and kind words for the graduating member  and he responded well to this intervention.    Family Program: Family present? No   Name of family member(s):   UDS collected: Yes Results: NEGATIVE  AA/NA attended?: YesMonday, Tuesday and Wednesday  Sponsor?: No   Kriss Ishler, LCAS

## 2011-04-29 NOTE — Progress Notes (Signed)
    Daily Group Progress Note  Program: CD-IOP   Group Time: 1-2:30 pm  Participation Level: Active  Behavioral Response: Appropriate and Sharing  Type of Therapy: Psycho-education Group  Topic: Family Sculpture: First half of group was spent sculpting families. The session included group members 'sculpting' their families at some point in their childhood. The sculpting member chose other group members to stand in for family members and positioned them according to the emotional "issues" that were present at that time in their life. Group members were asked how it felt to be positioned as they had been and the sculpting member was provided feedback and shared about their experience of the session.      Group Time: 2:45-4 pm  Participation Level: Active  Behavioral Response: Appropriate and Sharing  Type of Therapy: Process Group  Topic:Group Process and Graduation: the second half of group was spent in process. Members shared their feelings about current events and issues in their lives. There was good disclosure among members and the intensity of the previous exercise seemed to bring the group "deeper" and closer together. At the conclusion of the session, a graduation ceremony was conducted to honor a successfully graduating member.     Summary:The patient did not sculpt his family, but served in as a family member for other group members who did sculpt their families. The patient provided good feedback to other members and validation towards those who appeared to have had a very difficult and painful childhood. The patient was graduating today and the group made kind and encouraging comments during the graduation ceremony. The patient shared his hopes for each of his fellow group members and made some very heartfelt comments. He has been a very good group member and his presence will be missed by those he leaves behind. He remained alcohol-free for the duration of the program and  his sobriety date is February 14, 2011. He responded well to this final group session.     Family Program: Family present? No   Name of family member(s):   UDS collected: No Results:  AA/NA attended?: YesMonday, Tuesday and Wednesday  Sponsor?: No   Josph Norfleet, LCAS

## 2011-05-01 ENCOUNTER — Telehealth: Payer: Self-pay | Admitting: Internal Medicine

## 2011-05-01 NOTE — Telephone Encounter (Signed)
Save this for Dr. Regis Bill. If he needs some right now, please call in one month supply

## 2011-05-01 NOTE — Telephone Encounter (Signed)
Spoke to pt- he has enough until the end of the month. Pt doesn't need anything now. Pt to call us 2 weeks before he needs it.

## 2011-05-01 NOTE — Telephone Encounter (Signed)
needs new rx Amlodipine, generic Lipitor, Cialis, Remeron sent to [facility]. Thanks.

## 2011-05-13 ENCOUNTER — Other Ambulatory Visit: Payer: Self-pay | Admitting: Internal Medicine

## 2011-05-13 NOTE — Telephone Encounter (Signed)
Error pt will kidney doc for refills

## 2011-06-27 NOTE — Progress Notes (Signed)
This encounter was created in error - please disregard.

## 2011-07-10 ENCOUNTER — Other Ambulatory Visit: Payer: Self-pay | Admitting: Internal Medicine

## 2011-07-11 ENCOUNTER — Other Ambulatory Visit: Payer: Self-pay | Admitting: Internal Medicine

## 2011-07-30 ENCOUNTER — Telehealth: Payer: Self-pay

## 2011-07-30 NOTE — Telephone Encounter (Signed)
Pt states he picked up bp samples and it was the incorrect medication.  Pt states he needs Bystolic 10 mg.  Pt aware to come pick up samples of correct medication and return incorrect samples.

## 2011-08-23 ENCOUNTER — Telehealth: Payer: Self-pay | Admitting: Internal Medicine

## 2011-08-23 NOTE — Telephone Encounter (Signed)
Received form on 08/23/11.  Form given to Dr. Regis Bill.

## 2011-08-23 NOTE — Telephone Encounter (Signed)
Patient called stating that he is trying to get a back brace from a company called "[email or web address]" per patient as he could not remember the name of the company. Patient states the company asked him to call to expedite this quicker.

## 2011-08-24 ENCOUNTER — Other Ambulatory Visit: Payer: Self-pay | Admitting: Internal Medicine

## 2011-09-05 ENCOUNTER — Telehealth: Payer: Self-pay

## 2011-09-05 NOTE — Telephone Encounter (Signed)
Called and spoke with pt and made aware that per Dr. Regis Bill pt should have [employer] Back support form filled out by his specailist.  Pt states he sees Dr. Gillie Manners and would like the form sent in the mail to pt so that pt can get the form to doctor.  Form mailed to pt.

## 2011-10-18 ENCOUNTER — Telehealth: Payer: Self-pay | Admitting: Internal Medicine

## 2011-10-18 NOTE — Telephone Encounter (Signed)
3 bottles of Bystolic 10mg  ready for pick up and patient is aware

## 2011-10-18 NOTE — Telephone Encounter (Signed)
Caller: Jeremy Black; PCP: [name].;  Call regarding Needs Some Samples of Bystolic. PLEASE CALL HIM TO LET HIM KNOW IF THAT IS ALRIGHT; He can come by office to pick them up. CB#: [phone];

## 2011-10-22 ENCOUNTER — Encounter: Payer: Self-pay | Admitting: Internal Medicine

## 2011-10-22 ENCOUNTER — Ambulatory Visit (INDEPENDENT_AMBULATORY_CARE_PROVIDER_SITE_OTHER): Payer: Medicare Other | Admitting: Internal Medicine

## 2011-10-22 VITALS — BP 132/84 | HR 60 | Temp 98.4°F | Wt 231.0 lb

## 2011-10-22 DIAGNOSIS — R7309 Other abnormal glucose: Secondary | ICD-10-CM

## 2011-10-22 DIAGNOSIS — E669 Obesity, unspecified: Secondary | ICD-10-CM

## 2011-10-22 DIAGNOSIS — IMO0002 Reserved for concepts with insufficient information to code with codable children: Secondary | ICD-10-CM

## 2011-10-22 DIAGNOSIS — E785 Hyperlipidemia, unspecified: Secondary | ICD-10-CM

## 2011-10-22 DIAGNOSIS — I10 Essential (primary) hypertension: Secondary | ICD-10-CM

## 2011-10-22 NOTE — Progress Notes (Signed)
Subjective:    Patient ID: Jeremy Black, male    DOB: 01/31/1946, 66 y.o.   MRN: [id number]  HPI Patient comes in today for follow up of  multiple medical problems.  BP readings : feels good still playing golf once or twice a week and walking.  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    Is still in the bystolic samples question about optimizing his medications and cost. He is taking Micardis triamterene HCTZ and 5 mg bystolic  And says amlodipine bp has been much better off etoh and tobacco Feels that he is TOO HEAVY.   Loves ice cream.  Potato chips  And snack such as this.  Drinks  Juices.   Back stable able to play golf once a week. Is now coping prestty well with all of his life pressures.  Gets some meds from [place] but most go to [facility] .  Review of Systems No fever weight loss cough sob cp swelling numbness except great and little toe each foot cause of back problem and surgery.  ocass leg cramp Mom is 104 and has some dementia.   Dealing.  Has to take her to dentist with major problem   Past history family history social history reviewed in the electronic medical record. Outpatient Encounter Prescriptions as of 10/22/2011  Medication Sig Dispense Refill  . aspirin 81 MG chewable tablet Chew 81 mg by mouth daily.        Jeremy Black Kitchen atorvastatin (LIPITOR) 20 MG tablet TAKE 1 TABLET DAILY  90 tablet  0  . Garlic 123XX123 MG TABS Take by mouth.        Jeremy Black Kitchen ibuprofen (ADVIL,MOTRIN) 600 MG tablet Take 600 mg by mouth every 8 (eight) hours as needed.        Jeremy Black Kitchen MICARDIS 80 MG tablet TAKE 1 TABLET DAILY  90 tablet  0  . mirtazapine (REMERON) 15 MG tablet Take 1/2 to one tablet by mouth at bedtime as needed for sleep  30 tablet  0  . Multiple Vitamins-Minerals (CENTRUM SILVER PO) Take by mouth.        . nebivolol (BYSTOLIC) 10 MG tablet Take 10 mg by mouth daily. 1/2 daily      . tadalafil (CIALIS) 20 MG tablet Take 20 mg by mouth daily as needed.        . triamterene-hydrochlorothiazide (MAXZIDE-25)  37.5-25 MG per tablet Take 1 tablet by mouth daily.  90 tablet  1  . zolpidem (AMBIEN) 10 MG tablet Take 10 mg by mouth at bedtime as needed.        Jeremy Black Kitchen DISCONTD: triamterene-hydrochlorothiazide (DYAZIDE) 37.5-25 MG per capsule TAKE 1 CAPSULE DAILY  90 capsule  0  . DISCONTD: triamterene-hydrochlorothiazide (MAXZIDE-25) 37.5-25 MG per tablet TAKE 1 TABLET EVERY DAY  30 tablet  1  ? If accurate      Objective:   Physical Exam BP 132/84  Pulse 60  Temp 98.4 F (36.9 C) (Oral)  Wt 231 lb (104.781 kg)  SpO2 97%  WDWn in nad Wt Readings from Last 3 Encounters:  10/22/11 231 lb (104.781 kg)  01/21/11 221 lb (100.245 kg)  07/12/10 209 lb (94.802 kg)  repeat BP reading 128/82 right reg cuff.  wdwn in nad looks well Neck: Supple without adenopathy or masses or bruits Chest:  Clear to A&P without wheezes rales or rhonchi CV:  S1-S2 no gallops or murmurs peripheral perfusion is normal Abdomen:  Sof,t normal bowel sounds without hepatosplenomegaly, no guarding rebound or masses  no CVA tenderness No clubbing cyanosis or edema Oriented x 3. Normal cognition, attention, speech. Not anxious or depressed appearing   Good eye contact .     Assessment & Plan:  HT  Ok today.  But on 3-4 different meds and pills agree with coordinating these and optimizing  May be able to decrease pill count.  though was on a ca channel blocker but don't see this   Amlodipine  Perhaps combo pill may help.  Hx of etoh  Currently substance free. DJD  Back stable  Weight up  :Counseled. Stop sugar drinks and has a plan. ED  Sample of cialis 20

## 2011-10-22 NOTE — Patient Instructions (Addendum)
Get a list of   Medications  for blood pressure from your pharmacy benefits. The list should include preferred and nonpreferred or to your medications I think can choose from these to optimize  cost and pill count and still control  blood pressure.  Find out if you're really on amlodipine and what milligram you're on as it is not on  med list.  Continue healthy lifestyle cut out sugar drinks to help lose weight which will also help your back.  Will notify you  of labs when available. Then plan follow up  After getting the list.

## 2011-10-23 LAB — TSH: TSH: 0.79 u[IU]/mL (ref 0.35–5.50)

## 2011-10-23 LAB — BASIC METABOLIC PANEL
CO2: 23 mEq/L (ref 19–32)
Calcium: 9.3 mg/dL (ref 8.4–10.5)
Creatinine, Ser: 1.3 mg/dL (ref 0.4–1.5)
Sodium: 137 mEq/L (ref 135–145)

## 2011-10-23 LAB — MAGNESIUM: Magnesium: 1.8 mg/dL (ref 1.5–2.5)

## 2011-10-24 ENCOUNTER — Telehealth: Payer: Self-pay | Admitting: Internal Medicine

## 2011-10-24 NOTE — Telephone Encounter (Signed)
Caller: Viren/Patient; PCP: [name].; CB#: [phone]; ; ; Call regarding Message To Dr. Regis Bill; was seen in office 10/22/11, and she had asked him what dosing he had for his amlodipine; states this is for 5mg  qd.  Was prescribed by his nephrologist/changed from verapamil to reduce the protein load in the kidneys.  Also has requested blood pressure medication approved/formulary  list from [place] and will bring that when he receives it in the mail, in 7-10 days.   INFO TO OFFICE FOR PROVIDER REVIEW. MAY REACH PATIENT AT [phone].

## 2011-11-16 ENCOUNTER — Other Ambulatory Visit: Payer: Self-pay | Admitting: Internal Medicine

## 2011-11-23 ENCOUNTER — Other Ambulatory Visit: Payer: Self-pay | Admitting: Internal Medicine

## 2011-12-08 ENCOUNTER — Other Ambulatory Visit: Payer: Self-pay | Admitting: Internal Medicine

## 2012-01-21 ENCOUNTER — Encounter: Payer: Self-pay | Admitting: Internal Medicine

## 2012-01-21 ENCOUNTER — Ambulatory Visit (INDEPENDENT_AMBULATORY_CARE_PROVIDER_SITE_OTHER): Payer: Medicare Other | Admitting: Internal Medicine

## 2012-01-21 VITALS — BP 128/72 | HR 67 | Temp 98.3°F | Wt 229.0 lb

## 2012-01-21 DIAGNOSIS — N289 Disorder of kidney and ureter, unspecified: Secondary | ICD-10-CM

## 2012-01-21 DIAGNOSIS — E669 Obesity, unspecified: Secondary | ICD-10-CM

## 2012-01-21 DIAGNOSIS — E785 Hyperlipidemia, unspecified: Secondary | ICD-10-CM

## 2012-01-21 DIAGNOSIS — R7303 Prediabetes: Secondary | ICD-10-CM

## 2012-01-21 DIAGNOSIS — I10 Essential (primary) hypertension: Secondary | ICD-10-CM

## 2012-01-21 DIAGNOSIS — R7309 Other abnormal glucose: Secondary | ICD-10-CM

## 2012-01-21 DIAGNOSIS — F528 Other sexual dysfunction not due to a substance or known physiological condition: Secondary | ICD-10-CM

## 2012-01-21 NOTE — Progress Notes (Signed)
Subjective:    Patient ID: Jeremy Black, male    DOB: 10/01/1945, 66 y.o.   MRN: [id number]  HPI Patient comes in today for follow up of  multiple medical problems.  To have  Endoscopy on Oct  30  For prn swallowing.  No unintentional weight loss trying to lose weight . Exercise and walks daily with client.  Still at the [facility] and they do blood tests was told he was pre diabetic and  Changing diet . bp ok had run out micardis is expensive   Had given [place] copy of formulary ( but we didn't have it)  Review of Systems No cp sob  As above .   No new sx.  Bleeding  Ha vision changes syncope .  Feels stable psych.  Past history family history social history reviewed in the electronic medical record. Outpatient Encounter Prescriptions as of 01/21/2012  Medication Sig Dispense Refill  . aspirin 81 MG chewable tablet Chew 81 mg by mouth daily.        Marland Kitchen atorvastatin (LIPITOR) 20 MG tablet TAKE 1 TABLET DAILY  90 tablet  1  . ibuprofen (ADVIL,MOTRIN) 600 MG tablet Take 600 mg by mouth every 8 (eight) hours as needed.        Marland Kitchen MICARDIS 80 MG tablet TAKE 1 TABLET DAILY  90 tablet  0  . mirtazapine (REMERON) 15 MG tablet Take 1/2 to one tablet by mouth at bedtime as needed for sleep  30 tablet  0  . Multiple Vitamins-Minerals (CENTRUM SILVER PO) Take by mouth.        . nebivolol (BYSTOLIC) 10 MG tablet Take 10 mg by mouth daily. 1/2 daily      . tadalafil (CIALIS) 20 MG tablet Take 20 mg by mouth daily as needed.        . triamterene-hydrochlorothiazide (MAXZIDE-25) 37.5-25 MG per tablet Take 1 tablet by mouth daily.  90 tablet  1  . DISCONTD: Garlic 123XX123 MG TABS Take by mouth.        . DISCONTD: triamterene-hydrochlorothiazide (DYAZIDE) 37.5-25 MG per capsule TAKE 1 CAPSULE DAILY  90 capsule  1       Objective:   Physical Exam BP 128/72  Pulse 67  Temp 98.3 F (36.8 C) (Oral)  Wt 229 lb (103.874 kg)  SpO2 95% Wt Readings from Last 3 Encounters:  01/21/12 229 lb (103.874 kg)  10/22/11 231 lb  (104.781 kg)  01/21/11 221 lb (100.245 kg)   WDWN in nad   Looks well  HEENT nachanges Neck: Supple without adenopathy or masses or bruits Chest:  Clear to A&P without wheezes rales or rhonchi CV:  S1-S2 no gallops or murmurs peripheral perfusion is normal No clubbing cyanosis or edema Lab Results  Component Value Date   WBC 7.5 02/14/2011   HGB 13.9 02/14/2011   HCT 42.6 02/14/2011   PLT 187 02/14/2011   GLUCOSE 108* 10/22/2011   CHOL 161 01/31/2011   TRIG 325.0* 01/31/2011   HDL 54.60 01/31/2011   LDLDIRECT 66.7 01/31/2011   LDLCALC 71 12/28/2009   ALT 38 02/14/2011   AST 76* 02/14/2011   NA 137 10/22/2011   K 4.1 10/22/2011   CL 104 10/22/2011   CREATININE 1.3 10/22/2011   BUN 14 10/22/2011   CO2 23 10/22/2011   TSH 0.79 10/22/2011   PSA 0.82 09/07/2009   HGBA1C 6.1 01/31/2011   MICROALBUR 141.5* 09/14/2009        Assessment & Plan:   HT  Better   Pt said sent in the med list   Cr 1.3    Lab from [place] not available yet  Borderline diabetic .  By hx and had hyperglycemia on last check  Cut back  Ice cream and doing fruit instead.    And night snacks are down.  Continue lifestyle intervention healthy eating and exercise . Cp poss Gi related  eno pending  wegiht loss will hepl  Maintain tobacco and etoh and subs free  Moos seems pretty good  Samples of cialis x 1 noted and bystolic   Get [place] copy again of formulary so we can choose less expensive med  ( arb)I believe he had cough with acei although not on allergy list.  Review of list  micardis is a tier 2 and thus lowest branded as well as the other arbs except diovan some teir 3  And states that has  Same copay as prev plan .    Only one tier 1 and that is generic losartan  We can change at next refill to losartan 100 mg although it may not be as potent  for effective control.  getus copy of labs and then follow up as below   Total visit 67mins > 50% spent counseling and coordinating care

## 2012-01-21 NOTE — Patient Instructions (Signed)
Your blood pressure  Is ok today. See if you can get [place] another list.  Of the hypertension meds.  Get [place] a report on the endoscopy.  And recent labs.  Continue to lose weight and avoid  Simple sugars and sweets.  Helps avoid getting diabetes.   ROV in 6 months or as needed

## 2012-01-21 NOTE — Telephone Encounter (Signed)
Pt said sent in list but didn't get his     See ov . Will get to [place] again before refill needed in January for micardis

## 2012-01-26 DIAGNOSIS — N289 Disorder of kidney and ureter, unspecified: Secondary | ICD-10-CM | POA: Insufficient documentation

## 2012-01-26 DIAGNOSIS — R7303 Prediabetes: Secondary | ICD-10-CM | POA: Insufficient documentation

## 2012-01-26 MED ORDER — LOSARTAN POTASSIUM 100 MG PO TABS: 100.0000 mg | ORAL_TABLET | Freq: Every day | ORAL | Status: DC

## 2012-04-21 ENCOUNTER — Other Ambulatory Visit: Payer: Self-pay | Admitting: Internal Medicine

## 2012-04-21 ENCOUNTER — Encounter: Payer: Self-pay | Admitting: Internal Medicine

## 2012-04-21 MED ORDER — TELMISARTAN 80 MG PO TABS: 80.0000 mg | ORAL_TABLET | Freq: Every day | ORAL | Status: DC

## 2012-05-14 ENCOUNTER — Other Ambulatory Visit: Payer: Self-pay | Admitting: Internal Medicine

## 2012-05-14 MED ORDER — NEBIVOLOL HCL 10 MG PO TABS: 5.0000 mg | ORAL_TABLET | Freq: Every day | ORAL | Status: DC

## 2012-05-14 NOTE — Telephone Encounter (Signed)
Pt would like samples of bystolic 10 mg if none available please call [facility] [address]

## 2012-05-30 ENCOUNTER — Other Ambulatory Visit: Payer: Self-pay

## 2012-06-09 ENCOUNTER — Encounter: Payer: Self-pay | Admitting: Internal Medicine

## 2012-06-09 ENCOUNTER — Other Ambulatory Visit: Payer: Self-pay | Admitting: Family Medicine

## 2012-06-09 MED ORDER — TELMISARTAN 80 MG PO TABS: 80.0000 mg | ORAL_TABLET | Freq: Every day | ORAL | Status: DC

## 2012-06-09 MED ORDER — ATORVASTATIN CALCIUM 20 MG PO TABS: ORAL_TABLET | ORAL | Status: DC

## 2012-06-11 ENCOUNTER — Telehealth: Payer: Self-pay | Admitting: Family Medicine

## 2012-06-11 NOTE — Telephone Encounter (Signed)
[facility] is requesting a refill.  I called the pt to confirm that he is taking this medication.  He stated he is along with several others on the medication list.  I verified those.  I do not see this in the chart anywhere and wanted to check with you before I sent this in.  The pt is coming to see you in April.  Please advise.  Thanks!!

## 2012-06-11 NOTE — Telephone Encounter (Signed)
i cant tell from ehr  , maybe he is getting this from the [place] , call him about this to clarify what he is taking and who is rxing each.

## 2012-06-16 NOTE — Telephone Encounter (Signed)
The patient called back.  He mistakenly called Dr. Regis Bill.  This has been getting filled through Dr. Shelva Majestic office.  He will call her.

## 2012-06-16 NOTE — Telephone Encounter (Signed)
Had to wait for the pt to call back.  We did not have a phone number at all in the computer.  He left a message on my machine with phone number.  I left a message on his cell.  WP wanted me to check with him and see if she is filling this medication or if it was the [place].  Do not see where she has filled this in the system.  Waiting on pt reply.

## 2012-06-29 ENCOUNTER — Telehealth: Payer: Self-pay | Admitting: Internal Medicine

## 2012-06-29 NOTE — Telephone Encounter (Signed)
Called the pt and left a message to call back or call the triage nurse for further instructions.  Informed him that I will be leaving early due to the weather.

## 2012-06-29 NOTE — Telephone Encounter (Signed)
Pt was at the [facility] in [place] [facility] today for psychiatry appt. They took pt's BP and heart rate very low. They advised pt to see Dr Regis Bill ASAP. Pt was taking amlodipine rx'd by Dr Clover Mealy. But it is not on pt's med list. Pt has all info needed for MD to review. Pls advise.  Pt is taking BYSTOLIC 5 mg,  Pt wanted me to tell you BP readings from today 10am-11am. 153/89     154/95    155/92  Heart rate 40 & 41. Pt would like afternoon appt.

## 2012-06-29 NOTE — Telephone Encounter (Signed)
The patient called me back immediately.  He made an appt for 06/30/12 @ 11:30.  Asked him how he felt at the moment.  He said he felt fine.  Had so sx.  Informed his that if he started to feel strange, hr dropped lower, bp went higher and other sx arose than he should seek the ED.

## 2012-06-30 ENCOUNTER — Ambulatory Visit (INDEPENDENT_AMBULATORY_CARE_PROVIDER_SITE_OTHER): Payer: Medicare Other | Admitting: Internal Medicine

## 2012-06-30 ENCOUNTER — Encounter: Payer: Self-pay | Admitting: Internal Medicine

## 2012-06-30 VITALS — BP 170/80 | HR 48 | Temp 98.0°F | Wt 241.0 lb

## 2012-06-30 DIAGNOSIS — I498 Other specified cardiac arrhythmias: Secondary | ICD-10-CM

## 2012-06-30 DIAGNOSIS — R0602 Shortness of breath: Secondary | ICD-10-CM | POA: Insufficient documentation

## 2012-06-30 DIAGNOSIS — R809 Proteinuria, unspecified: Secondary | ICD-10-CM

## 2012-06-30 DIAGNOSIS — Z8719 Personal history of other diseases of the digestive system: Secondary | ICD-10-CM | POA: Insufficient documentation

## 2012-06-30 DIAGNOSIS — R7303 Prediabetes: Secondary | ICD-10-CM

## 2012-06-30 DIAGNOSIS — E785 Hyperlipidemia, unspecified: Secondary | ICD-10-CM

## 2012-06-30 DIAGNOSIS — Z9889 Other specified postprocedural states: Secondary | ICD-10-CM

## 2012-06-30 DIAGNOSIS — I10 Essential (primary) hypertension: Secondary | ICD-10-CM

## 2012-06-30 DIAGNOSIS — R7309 Other abnormal glucose: Secondary | ICD-10-CM

## 2012-06-30 DIAGNOSIS — R001 Bradycardia, unspecified: Secondary | ICD-10-CM | POA: Insufficient documentation

## 2012-06-30 LAB — CBC WITH DIFFERENTIAL/PLATELET
Basophils Relative: 0.7 % (ref 0.0–3.0)
Eosinophils Absolute: 0.1 10*3/uL (ref 0.0–0.7)
Eosinophils Relative: 1.6 % (ref 0.0–5.0)
Hemoglobin: 13.8 g/dL (ref 13.0–17.0)
Lymphocytes Relative: 30.6 % (ref 12.0–46.0)
MCHC: 33.2 g/dL (ref 30.0–36.0)
Monocytes Relative: 10.3 % (ref 3.0–12.0)
Neutro Abs: 4.3 10*3/uL (ref 1.4–7.7)
Neutrophils Relative %: 56.8 % (ref 43.0–77.0)
RBC: 4.99 Mil/uL (ref 4.22–5.81)
WBC: 7.6 10*3/uL (ref 4.5–10.5)

## 2012-06-30 LAB — HEPATIC FUNCTION PANEL
ALT: 20 U/L (ref 0–53)
AST: 21 U/L (ref 0–37)
Albumin: 3.1 g/dL — ABNORMAL LOW (ref 3.5–5.2)
Alkaline Phosphatase: 55 U/L (ref 39–117)
Bilirubin, Direct: 0 mg/dL (ref 0.0–0.3)
Total Protein: 6.5 g/dL (ref 6.0–8.3)

## 2012-06-30 LAB — LIPID PANEL
Cholesterol: 135 mg/dL (ref 0–200)
LDL Cholesterol: 71 mg/dL (ref 0–99)
Total CHOL/HDL Ratio: 5
VLDL: 39.4 mg/dL (ref 0.0–40.0)

## 2012-06-30 LAB — BASIC METABOLIC PANEL
CO2: 26 mEq/L (ref 19–32)
Calcium: 8.6 mg/dL (ref 8.4–10.5)
Creatinine, Ser: 1.3 mg/dL (ref 0.4–1.5)
GFR: 71.48 mL/min (ref >60.00)
Sodium: 139 mEq/L (ref 135–145)

## 2012-06-30 LAB — MAGNESIUM: Magnesium: 1.7 mg/dL (ref 1.5–2.5)

## 2012-06-30 MED ORDER — NEBIVOLOL HCL 5 MG PO TABS: 2.5000 mg | ORAL_TABLET | Freq: Every day | ORAL | Status: DC

## 2012-06-30 NOTE — Progress Notes (Signed)
Chief Complaint  Patient presents with  . Hypertension  . Bradycardia  . Shortness of Breath    HPI: Patient comes in today for SDA for  new problem evaluation. Went to [employer] yesterday And nurse noted that  bp was   150/90 and  On  Usually in that area  And then repeat was  And pulse  Was  Low  was told to come in today. Over the recent months he feels that he is having decreased exercise tolerance and shortness of breath noticed it when he went hunting. However there was no specific event he thinks it's from deconditioning weight gain a large her belly. He also run out of ofAmlodipine as given to him  Per dr Wilford Sports for proteinuria had called [place] first but has been out over months  Still on this  Was out of this over a month.   Refilled   And not back on this yet.   He has the prescription pills at home but hasn't taken them yet bystolic  Only on  1/2 pills. 5 mg a day.     ? Is tending to eat a lot of sweets Sugar.   He has not  on any medicines from the psychiatrist.  ROS: See pertinent positives and negatives per HPI.  He also has a PCP at the [place] but hasn't seen them since he has had his esophagus stretched.  Past Medical History  Diagnosis Date  . Hyperlipidemia   . Hypertension   . ED (erectile dysfunction)   . Proteinuria     past record showed 500mg  protein excretion per 24 hours eval by renal  . PTSD (post-traumatic stress disorder)     from [place] experience [place]  . Normal nuclear stress test     myoview neg 12 09 except for HT  . Other and unspecified alcohol dependence, unspecified drinking behavior     date of last use 02/13/11    Family History  Problem Relation Age of Onset  . Diabetes Mother   . Hypertension Mother   . Other Mother     nerves  . Throat cancer Father     deceased  . Coronary artery disease Sister   . Diabetes Sister   . Heart disease Sister   . Hypertension Brother   . Alcohol abuse Paternal Aunt   . Alcohol abuse Cousin   . Drug  abuse Son     cocaine dependent    History   Social History  . Marital Status: Legally Separated    Spouse Name: N/A    Number of Children: N/A  . Years of Education: N/A   Social History Main Topics  . Smoking status: Former Research scientist (life sciences)  . Smokeless tobacco: None  . Alcohol Use: 30.0 oz/week    50 Shots of liquor per week     Comment: Pt in treatment for alcohol dependence. Last date of use 02/13/11.  . Drug Use: No  . Sexually Active: None   Other Topics Concern  . 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   Working with disabled adults.  Plays golf weekly                 Outpatient Encounter Prescriptions as of 06/30/2012  Medication Sig Dispense Refill  . amLODipine (NORVASC) 5 MG tablet Take 5 mg by mouth daily.       Marland Kitchen aspirin 81 MG chewable tablet Chew 81 mg by mouth daily.        Marland Kitchen atorvastatin (LIPITOR) 20 MG tablet TAKE 1 TABLET DAILY  90 tablet  0  . cetirizine (ZYRTEC) 10 MG tablet Take 10 mg by mouth daily.      . mirtazapine (REMERON) 15 MG tablet Take 1/2 to one tablet by mouth at bedtime as needed for sleep  30 tablet  0  . Multiple Vitamins-Minerals (CENTRUM SILVER PO) Take by mouth.        . nebivolol (BYSTOLIC) 10 MG tablet Take 0.5 tablets (5 mg total) by mouth daily. 1/2 daily  45 tablet  0  . omeprazole (PRILOSEC) 20 MG capsule Take 20 mg by mouth daily. From the [place]      . telmisartan (MICARDIS) 80 MG tablet Take 1 tablet (80 mg total) by mouth daily.  90 tablet  0  . triamterene-hydrochlorothiazide (MAXZIDE-25) 37.5-25 MG per tablet Take 1 tablet by mouth daily.  90 tablet  1  . tadalafil (CIALIS) 20 MG tablet Take 20 mg by mouth daily as needed.        .  [DISCONTINUED] ibuprofen (ADVIL,MOTRIN) 600 MG tablet Take 600 mg by mouth every 8 (eight) hours as needed.         No facility-administered encounter medications on file as of 06/30/2012.    EXAM:  BP 170/80  Pulse 48  Temp(Src) 98 F (36.7 C) (Oral)  Wt 241 lb (109.317 kg)  BMI 34.08 kg/m2  SpO2 96%  Body mass index is 34.08 kg/(m^2).  GENERAL: vitals reviewed and listed above, alert, oriented, appears well hydrated and in no acute distress he is nontoxic nondyspneic at rest  HEENT: atraumatic, conjunctiva  clear, no obvious abnormalities on inspection of external nose and ears OP : no lesion edema or exudate   NECK: no obvious masses on inspection palpation no JVD is seen thicker neck  LUNGS: clear to auscultation bilaterally, no wheezes, rales or rhonchi, good air movement  CV: HRRR, no clubbing cyanosis or  peripheral edema nl cap refill   his pulses low at 45 and regular does go up to 55 with position change Abdomen:  Sof,t normal bowel sounds without hepatosplenomegaly, no guarding rebound or masses no CVA tenderness  no obv ascites  MS: moves all extremities without noticeable focal  abnormality  PSYCH: pleasant and cooperative, no obvious depression or anxiety he appears to be cognitively intact. EKG shows sinus bradycardia of 4 5 rate ASSESSMENT AND PLAN:  Discussed the following assessment and plan:  Unspecified essential hypertension - Plan: omeprazole (PRILOSEC) 20 MG capsule, amLODipine (NORVASC) 5 MG tablet, cetirizine (ZYRTEC) 10 MG tablet, EKG [id number], Basic metabolic panel, CBC with Differential, Hepatic function panel, TSH, T4, free, Lipid panel, Hemoglobin A1c, Magnesium, 2D Echocardiogram without contrast, Ambulatory referral to Cardiology  SOB (shortness of breath) - Plan: omeprazole (PRILOSEC) 20 MG capsule, amLODipine (NORVASC) 5 MG tablet, cetirizine (ZYRTEC) 10 MG tablet, EKG [id number], Basic metabolic panel, CBC with Differential, Hepatic function panel,  TSH, T4, free, Lipid panel, Hemoglobin A1c, Magnesium, 2D Echocardiogram without contrast, Ambulatory referral to Cardiology  Bradycardia - Plan: omeprazole (PRILOSEC) 20 MG capsule, amLODipine (NORVASC) 5 MG tablet,  cetirizine (ZYRTEC) 10 MG tablet, EKG [id number], Basic metabolic panel, CBC with Differential, Hepatic function panel, TSH, T4, free, Lipid panel, Hemoglobin A1c, Magnesium, 2D Echocardiogram without contrast, Ambulatory referral to Cardiology  HYPERLIPIDEMIA - Plan: omeprazole (PRILOSEC) 20 MG capsule, amLODipine (NORVASC) 5 MG tablet, cetirizine (ZYRTEC) 10 MG tablet, EKG [id number], Basic metabolic panel, CBC with Differential, Hepatic function panel, TSH, T4, free, Lipid panel, Hemoglobin A1c, Magnesium  HYPERGLYCEMIA, MILD - Plan: omeprazole (PRILOSEC) 20 MG capsule, amLODipine (NORVASC) 5 MG tablet, cetirizine (ZYRTEC) 10 MG tablet, EKG [id number], Basic metabolic panel, CBC with Differential, Hepatic function panel, TSH, T4, free, Lipid panel, Hemoglobin A1c, Magnesium  Proteinuria - Plan: omeprazole (PRILOSEC) 20 MG capsule, amLODipine (NORVASC) 5 MG tablet, cetirizine (ZYRTEC) 10 MG tablet, EKG [id number], Basic metabolic panel, CBC with Differential, Hepatic function panel, TSH, T4, free, Lipid panel, Hemoglobin A1c, Magnesium  Pre-diabetes - Plan: omeprazole (PRILOSEC) 20 MG capsule, amLODipine (NORVASC) 5 MG tablet, cetirizine (ZYRTEC) 10 MG tablet, EKG [id number], Basic metabolic panel, CBC with Differential, Hepatic function panel, TSH, T4, free, Lipid panel, Hemoglobin A1c, Magnesium Overall it appears he hasn't had labs done in a year although it is difficult to tell because he is also getting care at the [place] at times. It doesn't appear he has an acute event he does have bradycardia we'll try to wean the by systolic however his blood pressures very upset today suspect from weight gain and being off the amlodipine for a month or 2.  Either way suggest reevaluation for shortness of breath  with echocardiogram and cardiology evaluation.  Keep followup visit with me in a few weeks. Samples given of the bystolic 5 to cut in half and wean   although the severity of bradycardia. Seems a bit much.  -Patient advised to return or notify health care team  if symptoms worsen or persist or new concerns arise.  Patient Instructions  Begin amlodipine    To help with blood pressure  Your pulse is low and can cause fatigue. It is possible that the diastolic is contributing to the problem but also will check labs to rule out metabolic problems Decrease the Bystolic to 2.5 mg for 3-4 days and then every other day and stop. Will notify you  of labs when available.  Shortness of breath can be from many things but we want to check to see if it could be heart or long period it is possible is deconditioning but your pulses low and that can make you tired.  Advise  echocardiogram and cardiac opinion also.    Standley Brooking. Panosh M.D.

## 2012-06-30 NOTE — Patient Instructions (Addendum)
Begin amlodipine    To help with blood pressure  Your pulse is low and can cause fatigue. It is possible that the diastolic is contributing to the problem but also will check labs to rule out metabolic problems Decrease the Bystolic to 2.5 mg for 3-4 days and then every other day and stop. Will notify you  of labs when available.  Shortness of breath can be from many things but we want to check to see if it could be heart or long period it is possible is deconditioning but your pulses low and that can make you tired.  Advise  echocardiogram and cardiac opinion also.

## 2012-07-02 ENCOUNTER — Ambulatory Visit (HOSPITAL_COMMUNITY): Payer: Medicare Other | Attending: Internal Medicine | Admitting: Radiology

## 2012-07-02 DIAGNOSIS — I10 Essential (primary) hypertension: Secondary | ICD-10-CM

## 2012-07-02 DIAGNOSIS — I059 Rheumatic mitral valve disease, unspecified: Secondary | ICD-10-CM | POA: Insufficient documentation

## 2012-07-02 DIAGNOSIS — R0609 Other forms of dyspnea: Secondary | ICD-10-CM | POA: Insufficient documentation

## 2012-07-02 DIAGNOSIS — R0602 Shortness of breath: Secondary | ICD-10-CM

## 2012-07-02 DIAGNOSIS — I379 Nonrheumatic pulmonary valve disorder, unspecified: Secondary | ICD-10-CM | POA: Insufficient documentation

## 2012-07-02 DIAGNOSIS — R001 Bradycardia, unspecified: Secondary | ICD-10-CM

## 2012-07-02 DIAGNOSIS — I079 Rheumatic tricuspid valve disease, unspecified: Secondary | ICD-10-CM | POA: Insufficient documentation

## 2012-07-02 DIAGNOSIS — R0989 Other specified symptoms and signs involving the circulatory and respiratory systems: Secondary | ICD-10-CM | POA: Insufficient documentation

## 2012-07-02 NOTE — Progress Notes (Signed)
Echocardiogram performed.  

## 2012-07-06 ENCOUNTER — Other Ambulatory Visit: Payer: Self-pay | Admitting: Internal Medicine

## 2012-07-06 MED ORDER — TRIAMTERENE-HCTZ 37.5-25 MG PO TABS: 1.0000 | ORAL_TABLET | Freq: Every day | ORAL | Status: DC

## 2012-07-08 ENCOUNTER — Encounter: Payer: Self-pay | Admitting: Family Medicine

## 2012-07-08 ENCOUNTER — Other Ambulatory Visit: Payer: Self-pay | Admitting: Family Medicine

## 2012-07-08 DIAGNOSIS — R7989 Other specified abnormal findings of blood chemistry: Secondary | ICD-10-CM

## 2012-07-09 ENCOUNTER — Telehealth: Payer: Self-pay | Admitting: Internal Medicine

## 2012-07-09 ENCOUNTER — Ambulatory Visit (INDEPENDENT_AMBULATORY_CARE_PROVIDER_SITE_OTHER): Payer: Medicare Other | Admitting: Cardiovascular Disease

## 2012-07-09 ENCOUNTER — Other Ambulatory Visit: Payer: Medicare Other

## 2012-07-09 ENCOUNTER — Encounter: Payer: Self-pay | Admitting: Cardiovascular Disease

## 2012-07-09 ENCOUNTER — Other Ambulatory Visit (INDEPENDENT_AMBULATORY_CARE_PROVIDER_SITE_OTHER): Payer: Medicare Other

## 2012-07-09 VITALS — BP 135/85 | HR 61 | Ht 70.0 in | Wt 237.0 lb

## 2012-07-09 DIAGNOSIS — R6889 Other general symptoms and signs: Secondary | ICD-10-CM

## 2012-07-09 DIAGNOSIS — I10 Essential (primary) hypertension: Secondary | ICD-10-CM

## 2012-07-09 DIAGNOSIS — R7989 Other specified abnormal findings of blood chemistry: Secondary | ICD-10-CM

## 2012-07-09 DIAGNOSIS — I498 Other specified cardiac arrhythmias: Secondary | ICD-10-CM

## 2012-07-09 DIAGNOSIS — E785 Hyperlipidemia, unspecified: Secondary | ICD-10-CM

## 2012-07-09 DIAGNOSIS — R0602 Shortness of breath: Secondary | ICD-10-CM

## 2012-07-09 DIAGNOSIS — R001 Bradycardia, unspecified: Secondary | ICD-10-CM

## 2012-07-09 DIAGNOSIS — R079 Chest pain, unspecified: Secondary | ICD-10-CM

## 2012-07-09 LAB — T4, FREE: Free T4: 0.78 ng/dL (ref 0.60–1.60)

## 2012-07-09 MED ORDER — TELMISARTAN 80 MG PO TABS: 80.0000 mg | ORAL_TABLET | Freq: Two times a day (BID) | ORAL | Status: DC

## 2012-07-09 NOTE — Assessment & Plan Note (Signed)
D/C bystolic Invrease micardis to 80 bid.

## 2012-07-09 NOTE — Addendum Note (Signed)
Addended by: Joyce Gross R on: 07/09/2012 10:37 AM   Modules accepted: Orders

## 2012-07-09 NOTE — Telephone Encounter (Signed)
Pt returning your call. Pt also following up to let you know new instructions for his meds. They took pt off stop bystolic and increased micardis.

## 2012-07-09 NOTE — Progress Notes (Signed)
Patient ID: Jeremy Black, male   DOB: Oct 18, 1945, 67 y.o.   MRN: [id number]  68 yo seen at request of Dr Regis Bill for HTN and bradycardia.  Patient has had bystolic tapered recently but still taking qod.  Had some exertion fatigue and dyspnea No chest pain.  On BP meds for a while Compliant Moderate salt intake. 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  No kidney disease No history of murmur or aneurysm   ROS: Denies fever, malais, weight loss, blurry vision, decreased visual acuity, cough, sputum, SOB, hemoptysis, pleuritic pain, palpitaitons, heartburn, abdominal pain, melena, lower extremity edema, claudication, or rash.  All other systems reviewed and negative   General: Affect appropriate Healthy:  appears stated age 58: normal Neck supple with no adenopathy JVP normal no bruits no thyromegaly Lungs clear with no wheezing and good diaphragmatic motion Heart:  S1/S2 no murmur,rub, gallop or click PMI normal Abdomen: benighn, BS positve, no tenderness, no AAA no bruit.  No HSM or HJR Distal pulses intact with no bruits No edema Neuro non-focal Skin warm and dry No muscular weakness  Medications Current Outpatient Prescriptions  Medication Sig Dispense Refill  . amLODipine (NORVASC) 5 MG tablet Take 5 mg by mouth daily.       Marland Kitchen aspirin 81 MG chewable tablet Chew 81 mg by mouth daily.        Marland Kitchen atorvastatin (LIPITOR) 20 MG tablet TAKE 1 TABLET DAILY  90 tablet  0  . cetirizine (ZYRTEC) 10 MG tablet Take 10 mg by mouth daily.      . mirtazapine (REMERON) 15 MG tablet Take 1/2 to one tablet by mouth at bedtime as needed for sleep  30 tablet  0  . Multiple Vitamins-Minerals (CENTRUM SILVER PO) Take by mouth.        . nebivolol (BYSTOLIC) 5 MG tablet Take 0.5 tablets (2.5 mg total) by mouth daily. 1/2 daily wean as directed  45 tablet  0  . omeprazole (PRILOSEC) 20 MG capsule Take 20 mg by mouth daily. From the [place]      . tadalafil (CIALIS) 20  MG tablet Take 20 mg by mouth daily as needed.        Marland Kitchen telmisartan (MICARDIS) 80 MG tablet Take 1 tablet (80 mg total) by mouth daily.  90 tablet  0  . triamterene-hydrochlorothiazide (MAXZIDE-25) 37.5-25 MG per tablet Take 1 each (1 tablet total) by mouth daily.  90 tablet  1   No current facility-administered medications for this visit.    Allergies Codeine phosphate  Family History: Family History  Problem Relation Age of Onset  . Diabetes Mother   . Hypertension Mother   . Other Mother     nerves  . Throat cancer Father     deceased  . Coronary artery disease Sister   . Diabetes Sister   . Heart disease Sister   . Hypertension Brother   . Alcohol abuse Paternal Aunt   . Alcohol abuse Cousin   . Drug abuse Son     cocaine dependent    Social History: History   Social History  . Marital Status: Legally Separated    Spouse Name: N/A    Number of Children: N/A  . Years of Education: N/A   Occupational History  . Not on file.   Social History Main Topics  . Smoking status: Former Research scientist (life sciences)  . Smokeless tobacco: Not on file  . Alcohol  Use: 30.0 oz/week    50 Shots of liquor per week     Comment: Pt in treatment for alcohol dependence. Last date of use 02/13/11.  . Drug Use: No  . Sexually Active: Not on file   Other Topics Concern  . 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   Working with disabled adults.    Plays golf weekly                 Electrocardiogram:  06/30/12  SR rate 46  Normal ECG  Assessment and Plan

## 2012-07-09 NOTE — Assessment & Plan Note (Signed)
Functional Normal exam and echo normal with only mild LVH

## 2012-07-09 NOTE — Assessment & Plan Note (Signed)
Stop bystolic F/U ETT to make sure chronotropically competant

## 2012-07-09 NOTE — Assessment & Plan Note (Signed)
Cholesterol is at goal.  Continue current dose of statin and diet Rx.  No myalgias or side effects.  F/U  LFT's in 6 months. Lab Results  Component Value Date   LDLCALC 71 06/30/2012

## 2012-07-09 NOTE — Patient Instructions (Addendum)
Your physician recommends that you schedule a follow-up appointment in: [place] physician has recommended you make the following change in your medication:  STOP BYSTOLIC  AND INCREASE MICARDIS TO 54 MG 1 TAB TWICE DAILY   Your physician has requested that you have an exercise tolerance test. For further information please visit [email or web address]. Please also follow instruction sheet, as given. IN 2 WEEKS

## 2012-07-13 ENCOUNTER — Other Ambulatory Visit: Payer: Self-pay | Admitting: Family Medicine

## 2012-07-13 NOTE — Telephone Encounter (Signed)
Patient notified of lab work by telephone.  Bystolic has been stopped in his medication list.

## 2012-07-21 ENCOUNTER — Ambulatory Visit (INDEPENDENT_AMBULATORY_CARE_PROVIDER_SITE_OTHER): Payer: Medicare Other | Admitting: Internal Medicine

## 2012-07-21 ENCOUNTER — Encounter: Payer: Self-pay | Admitting: Internal Medicine

## 2012-07-21 VITALS — BP 144/82 | HR 85 | Temp 98.0°F | Wt 236.0 lb

## 2012-07-21 DIAGNOSIS — M79609 Pain in unspecified limb: Secondary | ICD-10-CM

## 2012-07-21 DIAGNOSIS — I10 Essential (primary) hypertension: Secondary | ICD-10-CM

## 2012-07-21 DIAGNOSIS — M79646 Pain in unspecified finger(s): Secondary | ICD-10-CM | POA: Insufficient documentation

## 2012-07-21 DIAGNOSIS — E119 Type 2 diabetes mellitus without complications: Secondary | ICD-10-CM | POA: Insufficient documentation

## 2012-07-21 DIAGNOSIS — R809 Proteinuria, unspecified: Secondary | ICD-10-CM

## 2012-07-21 DIAGNOSIS — N289 Disorder of kidney and ureter, unspecified: Secondary | ICD-10-CM

## 2012-07-21 DIAGNOSIS — E785 Hyperlipidemia, unspecified: Secondary | ICD-10-CM

## 2012-07-21 NOTE — Patient Instructions (Addendum)
The thumbs could be arthritis Can see  Sports medicine /Ortho about hands uncertain  whether    Injection wil help .    We will   Do a referral  For the new diagnosis of early diabetes . Weight loss would be very helpful  In all of the above.   Consider adding metformin    If needed. Intensify lifestyle interventions.  Repeat  Hg a1c   Bmp in 3 months.  And rov.

## 2012-07-21 NOTE — Progress Notes (Signed)
Chief Complaint  Patient presents with  . Follow-up    Blood pressure lab tests evaluation for shortness of breath    HPI:  Patient comes in today for follow up of  multiple medical problems.  Multiple issues to discuss.  Since his last visit he has seen cardiology and has stopped bystolic  And his pulse rate seems to be coming up nicely.  Echo nl mild lvh to stop bystolic and inc micardis to bid he however is taking it all the same time. Uncertain what the cost will be  To do ett to assess heart rate and cardiac function   Has tried to be more active and exercise some. Realizes he has to get the weight off and has a sweet tooth at night.   His thumbs hurt bilaterally have come and gone for a while uncertain what would help shot of cortisone or other plays golf but not all the time. Sometimes it is hard to grip and pick things up he doesn't significant golf injury but does like to play golf.   Asks for samples of Cialis.  He gets some care from the [place] gets omeprazole sertraline from the [place]  Amlodipine from Dr. August Saucer Don Broach Cialis /Lipitor from [place] PCP LB ROS: See pertinent positives and negatives per HPI. No syncope new neurologic symptoms tremors. Major vision changes. Is tobacco and alcohol free  Past Medical History  Diagnosis Date  . Hyperlipidemia   . Hypertension   . ED (erectile dysfunction)   . Proteinuria     past record showed 500mg  protein excretion per 24 hours eval by renal  . PTSD (post-traumatic stress disorder)     from [place] experience [place]  . Normal nuclear stress test     myoview neg 12 09 except for HT  . Other and unspecified alcohol dependence, unspecified drinking behavior     date of last use 02/13/11  . [facility]    Family [facility]  Problem Relation Age of Onset  . Diabetes Mother   . Hypertension Mother   . Other Mother     nerves  . Throat cancer Father     deceased  .  Coronary artery disease Sister   . Diabetes Sister   . Heart disease Sister   . Hypertension Brother   . Alcohol abuse Paternal Aunt   . Alcohol abuse Cousin   . Drug abuse Son     cocaine dependent    History   Social History  . Marital Status: Legally Separated    Spouse Name: N/A    Number of Children: N/A  . Years of Education: N/A   Social History Main Topics  . Smoking status: Former Research scientist (life sciences)  . Smokeless tobacco: None  . Alcohol Use: 30.0 oz/week    50 Shots of liquor per week     Comment: Pt in treatment for alcohol dependence. Last date of use 02/13/11.  . Drug Use: No  . Sexually Active: None   Other Topics Concern  . 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   Working with disabled adults.    Plays golf weekly                 Outpatient Encounter Prescriptions as of 07/21/2012  Medication Sig Dispense Refill  . amLODipine (NORVASC) 5 MG tablet Take 5 mg by mouth daily.       Marland Kitchen aspirin 81 MG chewable tablet Chew 81 mg by mouth daily.        Marland Kitchen atorvastatin (LIPITOR) 20 MG tablet TAKE 1 TABLET DAILY  90 tablet  0  . cetirizine (ZYRTEC) 10 MG tablet Take 10 mg by mouth daily.      . mirtazapine (REMERON) 15 MG tablet Take 1/2 to one tablet by mouth at bedtime as needed for sleep  30 tablet  0  . Multiple Vitamins-Minerals (CENTRUM SILVER PO) Take by mouth.        Marland Kitchen omeprazole (PRILOSEC) 20 MG capsule Take 20 mg by mouth daily. From the [place]      . tadalafil (CIALIS) 20 MG tablet Take 20 mg by mouth daily as needed.        Marland Kitchen telmisartan (MICARDIS) 80 MG tablet Take 1 tablet (80 mg total) by mouth 2 (two) times daily.  180 tablet  4  . triamterene-hydrochlorothiazide  (MAXZIDE-25) 37.5-25 MG per tablet Take 1 each (1 tablet total) by mouth daily.  90 tablet  1   No facility-administered encounter medications on file as of 07/21/2012.    EXAM:  BP 144/82  Pulse 85  Temp(Src) 98 F (36.7 C) (Oral)  Wt 236 lb (107.049 kg)  BMI 33.86 kg/m2  SpO2 96%  Body mass index is 33.86 kg/(m^2).  Filed Weights   07/21/12 1524  Weight: 236 lb (107.049 kg)   Wt Readings from Last 3 Encounters:  07/21/12 236 lb (107.049 kg)  07/09/12 237 lb (107.502 kg)  06/30/12 241 lb (109.317 kg)    GENERAL: vitals reviewed and listed above, alert, oriented, appears well hydrated and in no acute distress  CV: HRRR, no clubbing cyanosis  Hands no crepitus no redness   Area at base of thumbs  Of concern MS: moves all extremities without noticeable focal  abnormality  PSYCH: pleasant and cooperative, no obvious depression or anxiety Lab Results  Component Value Date   WBC 7.6 06/30/2012   HGB 13.8 06/30/2012   HCT 41.5 06/30/2012   PLT 296.0 06/30/2012   GLUCOSE 115* 06/30/2012   CHOL 135 06/30/2012   TRIG 197.0* 06/30/2012   HDL 24.80* 06/30/2012   LDLDIRECT 66.7 01/31/2011   LDLCALC 71 06/30/2012   ALT 20 06/30/2012   AST 21 06/30/2012   NA 139 06/30/2012   K 4.0 06/30/2012   CL 107 06/30/2012   CREATININE 1.3 06/30/2012   BUN 9 06/30/2012   CO2 26 06/30/2012   TSH 0.76 07/09/2012   PSA 0.82 09/07/2009   HGBA1C 7.1* 06/30/2012   MICROALBUR 141.5* 09/14/2009    ASSESSMENT AND PLAN:  Discussed the following assessment and plan:  Diabetes mellitus, new onset - disc options  lsi for now .  - Plan: Amb ref to Medical Nutrition Therapy-MNT  HYPERTENSION - Plan: Amb ref to Medical Nutrition Therapy-MNT  Renal insufficiency  cr 1.3 range  Proteinuria  Thumb pain, unspecified laterality - Plan: Ambulatory referral to Sports Medicine  HYPERLIPIDEMIA - Plan: Amb ref to Medical Nutrition Therapy-MNT Now meets criteria for early diabetes based on numbers. He is recently  implemented other lifestyle changes. The should continue option  of adding metforminas his creatinine is below 1.5.;    at this point we'll do referral to nutrition dietitian and having continue his exercise.   thumb pain arthritis versus tendinitis options discussed will do sports medicine referral.plays golf  Plan followup lab hemoglobin A1c and BMP in about 3 months in followup. Contact us in the meantime otherwise.  Healthy weight loss would be helpful for his metabolic dysfunction.    Discussed that make sure when he gets care at the [facility] that we get copies of assessment and lab tests.  -Patient advised to return or notify health care team  if symptoms worsen or persist or new concerns arise.  Patient Instructions  The thumbs could be arthritis Can see  Sports medicine /Ortho about hands uncertain  whether    Injection wil help .    We will   Do a referral  For the new diagnosis of early diabetes . Weight loss would be very helpful  In all of the above.   Consider adding metformin    If needed. Intensify lifestyle interventions.  Repeat  Hg a1c   Bmp in 3 months.  And rov.      Standley Brooking. Lachandra Dettmann M.D. Total visit 75mins > 50% spent counseling and coordinating care

## 2012-07-28 ENCOUNTER — Encounter: Payer: Medicare Other | Admitting: Cardiovascular Disease

## 2012-08-12 ENCOUNTER — Encounter: Payer: Medicare Other | Attending: Internal Medicine | Admitting: Dietician

## 2012-08-12 VITALS — Ht 71.0 in | Wt 228.5 lb

## 2012-08-12 DIAGNOSIS — E119 Type 2 diabetes mellitus without complications: Secondary | ICD-10-CM | POA: Insufficient documentation

## 2012-08-12 DIAGNOSIS — Z713 Dietary counseling and surveillance: Secondary | ICD-10-CM | POA: Insufficient documentation

## 2012-08-12 NOTE — Progress Notes (Signed)
Medical Nutrition Therapy:  Appt start time: 1435end time:  [id number].   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  Has moved in with his mother who is 67 years old, and is caring for her.  He does the cooking for his mother who has diabetes and for himself.  He notes that he is eating out less now that he has learned of the diabetes.  He is reading labels and trying to omit the concentrated sweets.  He verbalizes a desire to not go on medications and to not experience the "usual complications that can happen."  Current A1C is at 7.1%.  MEDICATIONS: Med review completed.  Chooses to not start DM meds but to use diet, exercise and weight loss.  BLOOD GLUCOSE MONITORING:  Not currently monitoring.  Plans to speak with his MD and request the option to monitor blood glucose.  FOOT SELF-EXAM:  Not currently performing.  Review the process and the importance of the daily preventive check.  DILATED EYE EXAM:  Recently had a dilated eye exam.  DIETARY INTAKE:  Usual eating pattern includes 2-3 meals and 1-2-3 snacks per day.  Everyday foods includes a variety of foods, eating a lot of nuts and fruit. .  Avoided foods include bread and starches.    24-hr recall:  B ( AM): 8:00 Fruit bowls and decaf coffee.  Has it more rarely.  Part of a banana each day and 1/2 bowl of oatmeal OR 1/2 banana with a fruit bowl of mixed fruit. (about 1+ cup)  Snk ( AM): might have some nuts (mix of nuts, dried fruit) trail. water  L ( PM): Sometimes eat meal.  Some days an apple and a piece of chicken and potato slices.  Apple on 2-3 days per week.  Sometimes will eat part of a salad Snk ( PM): Seldom, not hungry. D ( PM): Salad with baked chicken (mixed greens with the chicken, boiled egg, croutons,) often will skip the crackers. Snk ( PM): nuts or fruits.  Beverages: decaf coffee,  water with mio, using sweetener.  Usual physical activity: Walking daily.  He uses the track at the [facility] and will do some 5 lb weights at times.  Estimated energy needs: HT:91 in  WT: 239 lb  BMI: 31.9 kg/m2  Adj WT: 197 lb (90 kg) 1700-1800 calories 195-200 g carbohydrates 130-135 g protein 47-49 g fat  Progress Towards Goal(s):  In progress.   Nutritional Diagnosis:  [place]-2.1 Inpaired nutrition utilization As related to glucose.  As evidenced by new diagnosis of type 2 diabetes with an A1C at 7.1%..    Intervention:  Nutrition/Diabetes  Continue to read your food labels.  Try to always have fiber in the cereals, grains, breads.  Aim for 3+ gm of fiber in cereal and 2 gm of fiber in breads.  Try to keep the sugar level to (0-9 gm) per serving for the majority of a day.  Aim for 3 meals or 2 meals and a large mid-day snack.  Regular meals and/or snacks help with regulating blood glucose.  At all meals and snacks, aim for a protein food.  For meals the recommended serving size is the palm of your hand and at snacks about 1-2 oz or if using nuts for snack, aim for 1/4 cup.    Plan to bake, broil, grill, roast, stew, steam foods.  Avoid frying.  Plan to have 2-3 added fat servings at a meal.  The serving size is the recommended serving on the food label.  Plan to have 2-3 servings of the non-starchy vegetables at each meal.  Try to keep your carb servings to 3-4 or 45-60 gms per meal and 15 gm for snack.  Use your food label, or the exchange list on the yellow card or the [place] Carb Counting guide.  A web site for counting carbs and looking up nutrition information is [employer].  Try to get to the [facility] on a regular basis.  Keep up the walking.  Daily would be great.  Aim for 150 minutes + each week.    A weight loss of 5-10% will improve blood glucose.  The walking and diet will help.  Your MD can order you a glucose meter and prescribe the strips and lancets.   The [facility] meter of choice is usually an Accu-Chek meter.  Glucose goals would be a fasting of 90-110 mg and after meals, a goal of 80-14 mg given your desire to not take medications.  Handouts given during visit include:  Living Well with Diabaetes  Yellow Card with Diet Prescription and exchange list.  Novo Nordisk Carb Counting Guide  Controlling Blood Glucose  Monitoring/Evaluation:  Dietary intake, exercise, blood glucose levels, and body weight in 8-12 weeks.Marland Kitchen

## 2012-08-15 ENCOUNTER — Encounter: Payer: Self-pay | Admitting: Dietician

## 2012-08-17 NOTE — Patient Instructions (Addendum)
   Continue to read your food labels.  Try to always have fiber in the cereals, grains, breads.  Aim for 3+ gm of fiber in cereal and 2 gm of fiber in breads.  Try to keep the sugar level to (0-9 gm) per serving for the majority of a day.  Aim for 3 meals or 2 meals and a large mid-day snack.  Regular meals and/or snacks help with regulating blood glucose.  At all meals and snacks, aim for a protein food.  For meals the recommended serving size is the palm of your hand and at snacks about 1-2 oz or if using nuts for snack, aim for 1/4 cup.    Plan to bake, broil, grill, roast, stew, steam foods.  Avoid frying.  Plan to have 2-3 added fat servings at a meal.  The serving size is the recommended serving on the food label.  Plan to have 2-3 servings of the non-starchy vegetables at each meal.  Try to keep your carb servings to 3-4 or 45-60 gms per meal and 15 gm for snack.  Use your food label, or the exchange list on the yellow card or the [place] Carb Counting guide.  A web site for counting carbs and looking up nutrition information is [employer]ch.  Try to get to the [facility] on a regular basis.  Keep up the walking.  Daily would be great.  Aim for 150 minutes + each week.    A weight loss of 5-10% will improve blood glucose.  The walking and diet will help.  Your MD can order you a glucose meter and prescribe the strips and lancets.  The [facility] meter of choice is usually an Accu-Chek meter.  Glucose goals would be a fasting of 90-110 mg and after meals, a goal of 80-14 mg given your desire to not take medications.

## 2012-08-25 ENCOUNTER — Ambulatory Visit (INDEPENDENT_AMBULATORY_CARE_PROVIDER_SITE_OTHER): Payer: Medicare Other | Admitting: Sports Medicine

## 2012-08-25 VITALS — BP 130/90 | Ht 70.0 in | Wt 226.0 lb

## 2012-08-25 DIAGNOSIS — M19049 Primary osteoarthritis, unspecified hand: Secondary | ICD-10-CM

## 2012-08-25 DIAGNOSIS — M189 Osteoarthritis of first carpometacarpal joint, unspecified: Secondary | ICD-10-CM

## 2012-08-25 NOTE — Assessment & Plan Note (Signed)
He would prefer not to take more medicaitons  Injection today with a total of .0.4cc kenalog 10 and 0.4 ccs of lidocaine injected into joint with direct visualization  Tolerated well  Advised of side-effects and risks  Will follow with Dr Regis Bill and return as needed for me to repeat

## 2012-08-25 NOTE — Progress Notes (Signed)
  Subjective:    Patient ID: Jeremy Black, male    DOB: [birth year], 67 y.o.   MRN: [id number]  HPI  1. Bilateral thumb pain. L>R. Patient states has been a chronic, constant pain with movements for past 2.5 years. Notices when screwing lids off and lifting things.   No new activities or injuries. 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 Denies any edema, redness, other joint pains, myalgias. He is pre-diabetic and trying for weight loss.   Review of Systems See HPI otherwise negative.    Objective:   Physical Exam  Musculoskeletal:  Left hand mild TTP at [facility] inferiorly. No edema, bruising or anatomic snuffbox TTP.  Right hand exam is without TTP.   Thumb opposition strength intact, non tender. Good radial pulses B. Sensation to light touch intact distally.     MSK [place]: left CMC joint1  significant spurring and joint space narrowing in posterior/inferior aspect.  RT [place] joint looks normal      Assessment & Plan:   Discussed treatment options for left CMC arthritis. Patient wishes to avoid pills/NSAIDs with his chronic medical problems and HTN. He consented to left CMC injection today.  Will f/u prn or if desires repeat injection in 3-4 months.    A steroid injection was performed at left [facility] using ultrasound guidance using 1% plain Lidocaine and 0.4 mg of Kenalog 10 This was well tolerated.

## 2012-08-25 NOTE — Patient Instructions (Addendum)
You have some arthritis in thumb joint. You received steroid injection today. Please monitor for any swelling, redness or worsening of pain and notify MD if develops. Wear thumb brace when working for next 4-8 weeks. Please return if symptoms return in next 3 months.

## 2012-08-27 ENCOUNTER — Encounter: Payer: Medicare Other | Admitting: Cardiovascular Disease

## 2012-09-15 ENCOUNTER — Ambulatory Visit (INDEPENDENT_AMBULATORY_CARE_PROVIDER_SITE_OTHER): Payer: Medicare Other | Admitting: Cardiovascular Disease

## 2012-09-15 DIAGNOSIS — R079 Chest pain, unspecified: Secondary | ICD-10-CM

## 2012-09-15 NOTE — Progress Notes (Signed)
Exercise Treadmill Test  Pre-Exercise Testing Evaluation Rhythm: sinus bradycardia  Rate: 56     Test  Exercise Tolerance Test Ordering MD: Jenkins Rouge, MD  Interpreting MD: Jenkins Rouge, MD  Unique Test No: 1  Treadmill:  1  Indication for ETT: chest pain - rule out ischemia  Contraindication to ETT: No   Stress Modality: exercise - treadmill  Cardiac Imaging Performed: non   Protocol: standard Bruce - maximal  Max BP:  221 114   Max MPHR (bpm):  153 85% MPR (bpm):  130  MPHR obtained (bpm):  131 % MPHR obtained:  86%  Reached 85% MPHR (min:sec):  4:00 Total Exercise Time (min-sec):  4:00  Workload in METS:  7 Borg Scale: 18  Reason ETT Terminated:  Dyspnea and fatigue    ST Segment Analysis At Rest: normal ST segments - no evidence of significant ST depression With Exercise: no evidence of significant ST depression  Other Information Arrhythmia:  No Angina during ETT:  absent (0) Quality of ETT:  diagnostic  ETT Interpretation:  normal - no evidence of ischemia by ST analysis  Comments: Appropriate HR response n  Recommendations: No chronotropic incompetace  Continue to stay off beta blockers Continue BP meds and stressed compliance

## 2012-09-19 ENCOUNTER — Other Ambulatory Visit: Payer: Self-pay | Admitting: Internal Medicine

## 2012-10-10 ENCOUNTER — Other Ambulatory Visit: Payer: Self-pay | Admitting: Internal Medicine

## 2012-10-14 ENCOUNTER — Other Ambulatory Visit (INDEPENDENT_AMBULATORY_CARE_PROVIDER_SITE_OTHER): Payer: Medicare Other

## 2012-10-14 DIAGNOSIS — R7303 Prediabetes: Secondary | ICD-10-CM

## 2012-10-14 DIAGNOSIS — R7309 Other abnormal glucose: Secondary | ICD-10-CM

## 2012-10-14 LAB — HEMOGLOBIN A1C: Hgb A1c MFr Bld: 7.2 % — ABNORMAL HIGH (ref 4.6–6.5)

## 2012-10-14 LAB — BASIC METABOLIC PANEL
BUN: 16 mg/dL (ref 6–23)
Chloride: 102 mEq/L (ref 96–112)
Creatinine, Ser: 1.4 mg/dL (ref 0.4–1.5)
Glucose, Bld: 105 mg/dL — ABNORMAL HIGH (ref 70–99)
Potassium: 4 mEq/L (ref 3.5–5.1)

## 2012-10-21 ENCOUNTER — Ambulatory Visit (INDEPENDENT_AMBULATORY_CARE_PROVIDER_SITE_OTHER): Payer: Medicare Other | Admitting: Internal Medicine

## 2012-10-21 ENCOUNTER — Encounter: Payer: Self-pay | Admitting: Internal Medicine

## 2012-10-21 VITALS — BP 126/70 | HR 73 | Temp 98.2°F | Wt 231.0 lb

## 2012-10-21 DIAGNOSIS — F528 Other sexual dysfunction not due to a substance or known physiological condition: Secondary | ICD-10-CM

## 2012-10-21 DIAGNOSIS — N289 Disorder of kidney and ureter, unspecified: Secondary | ICD-10-CM

## 2012-10-21 DIAGNOSIS — I10 Essential (primary) hypertension: Secondary | ICD-10-CM

## 2012-10-21 DIAGNOSIS — E119 Type 2 diabetes mellitus without complications: Secondary | ICD-10-CM

## 2012-10-21 NOTE — Progress Notes (Signed)
Chief Complaint  Patient presents with  . Follow-up    HPI:  Patient comes in for followup of multiple medical problems. Eye irritation his [place] doctor said to increase his [place] seen to twice a day he has been to the dietitian through our system and is doing some healthy changes got married this past week and his wife pays attention to healthy food and exercise. Going to a diabetes class  At the [place]  in September Ordered.  By his [place] PCP Paid to go .  Travel pay.  Less than 40  And  to back.  2 the psychiatrist also Got a steroid shot in thumb and  Made better.  And then better after  brace.   Works  And   Hard to remember things.    Has a lot on his plate. No new medicines no alcohol drugs of meds. Asks for samples of Cialis if available. ROS: See pertinent positives and negatives per HPI. No current chest pain shortness of breath syncope. Due for an eye check. No new neuropathy symptoms  Past Medical History  Diagnosis Date  . Hyperlipidemia   . Hypertension   . ED (erectile dysfunction)   . Proteinuria     past record showed 500mg  protein excretion per 24 hours eval by renal  . PTSD (post-traumatic stress disorder)     from [place] experience [place]  . Normal nuclear stress test     myoview neg 12 09 except for HT  . Other and unspecified alcohol dependence, unspecified drinking behavior     date of last use 02/13/11  . [facility]  . Diabetes mellitus without complication     Family History  Problem Relation Age of Onset  . Diabetes Mother   . Hypertension Mother   . Other Mother     nerves  . Throat cancer Father     deceased  . Coronary artery disease Sister   . Diabetes Sister   . Heart disease Sister   . Hypertension Brother   . Alcohol abuse Paternal Aunt   . Alcohol abuse Cousin   . Drug abuse Son     cocaine dependent    History   Social History  . Marital Status: Legally Separated    Spouse Name: N/A    Number  of Children: N/A  . Years of Education: N/A   Social History Main Topics  . Smoking status: Former Research scientist (life sciences)  . Smokeless tobacco: None  . Alcohol Use: 30.0 oz/week    50 Shots of liquor per week     Comment: Pt in treatment for alcohol dependence. Last date of use 02/13/11.  . Drug Use: No  . Sexually Active: None   Other Topics Concern  . 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   Working with disabled adults.    Plays golf weekly                 Outpatient Encounter Prescriptions as of 10/21/2012  Medication Sig Dispense Refill  . amLODipine (NORVASC) 5 MG tablet  Take 5 mg by mouth daily.       Marland Kitchen aspirin 81 MG chewable tablet Chew 81 mg by mouth daily.        Marland Kitchen atorvastatin (LIPITOR) 20 MG tablet Take 1 tablet by mouth  daily  90 tablet  2  . cetirizine (ZYRTEC) 10 MG tablet Take 10 mg by mouth 2 (two) times daily.       . mirtazapine (REMERON) 15 MG tablet Take 1/2 to one tablet by mouth at bedtime as needed for sleep  30 tablet  0  . Multiple Vitamins-Minerals (CENTRUM SILVER PO) Take by mouth.        Marland Kitchen omeprazole (PRILOSEC) 20 MG capsule Take 20 mg by mouth daily. From the [place]      . tadalafil (CIALIS) 20 MG tablet Take 20 mg by mouth daily as needed.        Marland Kitchen telmisartan (MICARDIS) 80 MG tablet Take 1 tablet (80 mg total) by mouth 2 (two) times daily.  180 tablet  4  . triamterene-hydrochlorothiazide (MAXZIDE-25) 37.5-25 MG per tablet Take 1 tablet by mouth  daily  90 tablet  3   No facility-administered encounter medications on file as of 10/21/2012.    EXAM:  BP 126/70  Pulse 73  Temp(Src) 98.2 F (36.8 C) (Oral)  Wt 231 lb (104.781 kg)  BMI 33.15 kg/m2  SpO2  94%  Body mass index is 33.15 kg/(m^2). Wt Readings from Last 3 Encounters:  10/21/12 231 lb (104.781 kg)  08/25/12 226 lb (102.513 kg)  08/12/12 228 lb 8 oz (103.647 kg)    GENERAL: vitals reviewed and listed above, alert, oriented, appears well hydrated and in no acute distress  HEENT: atraumatic, conjunctiva  clear, no obvious abnormalities on inspection of external nose and ears OP : no lesion edema or exudate   NECK: no obvious masses on inspection palpation   LUNGS: clear to auscultation bilaterally, no wheezes, rales or rhonchi, good air movement  CV: HRRR, no clubbing cyanosis or  peripheral edema nl cap refill   MS: moves all extremities without noticeable focal  abnormality  PSYCH: pleasant and cooperative, no obvious depression or anxiety Lab Results  Component Value Date   WBC 7.6 06/30/2012   HGB 13.8 06/30/2012   HCT 41.5 06/30/2012   PLT 296.0 06/30/2012   GLUCOSE 105* 10/14/2012   CHOL 135 06/30/2012   TRIG 197.0* 06/30/2012   HDL 24.80* 06/30/2012   LDLDIRECT 66.7 01/31/2011   LDLCALC 71 06/30/2012   ALT 20 06/30/2012   AST 21 06/30/2012   NA 138 10/14/2012   K 4.0 10/14/2012   CL 102 10/14/2012   CREATININE 1.4 10/14/2012   BUN 16 10/14/2012   CO2 26 10/14/2012   TSH 0.76 07/09/2012   PSA 0.82 09/07/2009   HGBA1C 7.2* 10/14/2012   MICROALBUR 141.5* 09/14/2009    ASSESSMENT AND PLAN:  Discussed the following assessment and plan:  Diabetes mellitus, new onset  HYPERTENSION  ERECTILE DYSFUNCTION  Renal insufficiency  cr 1.3 range  Thumb problems are better plays golf about once every couple weeks.  Discussed laboratory findings he really does not want to take any more pills. His A1c is stable but still in the diabetic range. Discuss possibilities he can continue with the diabetes class at the [place] discussed metformin is safe to wait 3-4 months to see how he does. Discussed having him get an eye check. Uncertain what to make about the memory situation no evidence I doubt  of any  medications or involved he does have lots of stress. We will readdress at his next visit we can discuss also with the psychiatrist at the [facility] Recently  Married. Last weekend.  Sometimes difficult to manage because he has to primary care sources uncertain who is managing what but will be seen at the [place] also. -Patient advised to return or notify health care team  if symptoms worsen or persist or new concerns arise.  Patient Instructions  Continue lifestyle intervention healthy eating and exercise . Recheck  Labs in  3-4 months  and then visit.  Get an eye check . Blood pressure is good . today.     Standley Brooking. Panosh M.D.

## 2012-10-21 NOTE — Patient Instructions (Addendum)
Continue lifestyle intervention healthy eating and exercise . Recheck  Labs in  3-4 months  and then visit.  Get an eye check . Blood pressure is good . today.

## 2012-11-18 ENCOUNTER — Other Ambulatory Visit: Payer: Self-pay

## 2012-11-30 ENCOUNTER — Telehealth: Payer: Self-pay | Admitting: Internal Medicine

## 2012-11-30 ENCOUNTER — Ambulatory Visit (INDEPENDENT_AMBULATORY_CARE_PROVIDER_SITE_OTHER): Payer: Medicare Other | Admitting: Family

## 2012-11-30 ENCOUNTER — Encounter: Payer: Self-pay | Admitting: Family

## 2012-11-30 VITALS — BP 124/60 | HR 71 | Wt 230.0 lb

## 2012-11-30 DIAGNOSIS — M79672 Pain in left foot: Secondary | ICD-10-CM

## 2012-11-30 DIAGNOSIS — M109 Gout, unspecified: Secondary | ICD-10-CM

## 2012-11-30 DIAGNOSIS — M79609 Pain in unspecified limb: Secondary | ICD-10-CM

## 2012-11-30 MED ORDER — INDOMETHACIN 50 MG PO CAPS: 50.0000 mg | ORAL_CAPSULE | Freq: Three times a day (TID) | ORAL | Status: DC

## 2012-11-30 MED ORDER — KETOROLAC TROMETHAMINE 60 MG/2ML IM SOLN
60.0000 mg | Freq: Once | INTRAMUSCULAR | Status: AC
  Administered 2012-11-30: 60 mg via INTRAMUSCULAR

## 2012-11-30 NOTE — Telephone Encounter (Signed)
[  phone message needs clinical triage to decide on action.   ? Ov ? What speciaist is appropriate.

## 2012-11-30 NOTE — Telephone Encounter (Signed)
Do you want to do referral to send to podiatry? Other specialist?

## 2012-11-30 NOTE — Patient Instructions (Addendum)
Gout  Gout is an inflammatory condition (arthritis) caused by a buildup of uric acid crystals in the joints. Uric acid is a chemical that is normally present in the blood. Under some circumstances, uric acid can form into crystals in your joints. This causes joint redness, soreness, and swelling (inflammation). Repeat attacks are common. Over time, uric acid crystals can form into masses (tophi) near a joint, causing disfigurement. Gout is treatable and often preventable.  CAUSES   The disease begins with elevated levels of uric acid in the blood. Uric acid is produced by your body when it breaks down a naturally found substance called purines. This also happens when you eat certain foods such as meats and fish. Causes of an elevated uric acid level include:   Being passed down from parent to child (heredity).   Diseases that cause increased uric acid production (obesity, psoriasis, some cancers).   Excessive alcohol use.   Diet, especially diets rich in meat and seafood.   Medicines, including certain cancer-fighting drugs (chemotherapy), diuretics, and aspirin.   Chronic kidney disease. The kidneys are no longer able to remove uric acid well.   Problems with metabolism.  Conditions strongly associated with gout include:   Obesity.   High blood pressure.   High cholesterol.   Diabetes.  Not everyone with elevated uric acid levels gets gout. It is not understood why some people get gout and others do not. Surgery, joint injury, and eating too much of certain foods are some of the factors that can lead to gout.  SYMPTOMS    An attack of gout comes on quickly. It causes intense pain with redness, swelling, and warmth in a joint.   Fever can occur.   Often, only one joint is involved. Certain joints are more commonly involved:   Base of the big toe.   Knee.   Ankle.   Wrist.   Finger.  Without treatment, an attack usually goes away in a few days to weeks. Between attacks, you usually will not have  symptoms, which is different from many other forms of arthritis.  DIAGNOSIS   Your caregiver will suspect gout based on your symptoms and exam. Removal of fluid from the joint (arthrocentesis) is done to check for uric acid crystals. Your caregiver will give you a medicine that numbs the area (local anesthetic) and use a needle to remove joint fluid for exam. Gout is confirmed when uric acid crystals are seen in joint fluid, using a special microscope. Sometimes, blood, urine, and X-ray tests are also used.  TREATMENT   There are 2 phases to gout treatment: treating the sudden onset (acute) attack and preventing attacks (prophylaxis).  Treatment of an Acute Attack   Medicines are used. These include anti-inflammatory medicines or steroid medicines.   An injection of steroid medicine into the affected joint is sometimes necessary.   The painful joint is rested. Movement can worsen the arthritis.   You may use warm or cold treatments on painful joints, depending which works best for you.   Discuss the use of coffee, vitamin C, or cherries with your caregiver. These may be helpful treatment options.  Treatment to Prevent Attacks  After the acute attack subsides, your caregiver may advise prophylactic medicine. These medicines either help your kidneys eliminate uric acid from your body or decrease your uric acid production. You may need to stay on these medicines for a very long time.  The early phase of treatment with prophylactic medicine can be associated   with an increase in acute gout attacks. For this reason, during the first few months of treatment, your caregiver may also advise you to take medicines usually used for acute gout treatment. Be sure you understand your caregiver's directions.  You should also discuss dietary treatment with your caregiver. Certain foods such as meats and fish can increase uric acid levels. Other foods such as dairy can decrease levels. Your caregiver can give you a list of foods  to avoid.  HOME CARE INSTRUCTIONS    Do not take aspirin to relieve pain. This raises uric acid levels.   Only take over-the-counter or prescription medicines for pain, discomfort, or fever as directed by your caregiver.   Rest the joint as much as possible. When in bed, keep sheets and blankets off painful areas.   Keep the affected joint raised (elevated).   Use crutches if the painful joint is in your leg.   Drink enough water and fluids to keep your urine clear or pale yellow. This helps your body get rid of uric acid. Do not drink alcoholic beverages. They slow the passage of uric acid.   Follow your caregiver's dietary instructions. Pay careful attention to the amount of protein you eat. Your daily diet should emphasize fruits, vegetables, whole grains, and fat-free or low-fat milk products.   Maintain a healthy body weight.  SEEK MEDICAL CARE IF:    You have an oral temperature above 102 F (38.9 C).   You develop diarrhea, vomiting, or any side effects from medicines.   You do not feel better in 24 hours, or you are getting worse.  SEEK IMMEDIATE MEDICAL CARE IF:    Your joint becomes suddenly more tender and you have:   Chills.   An oral temperature above 102 F (38.9 C), not controlled by medicine.  MAKE SURE YOU:    Understand these instructions.   Will watch your condition.   Will get help right away if you are not doing well or get worse.  Document Released: 03/29/2000 Document Revised: 06/24/2011 Document Reviewed: 07/10/2009  ExitCare Patient Information 2014 [employer].

## 2012-11-30 NOTE — Progress Notes (Signed)
Subjective:    Patient ID: Jeremy Black, male    DOB: 08/13/1945, 67 y.o.   MRN: [id number]  HPI 67 year old African American male, nonsmoker, patient of Dr. Regis Bill is in today with complaints of a swollen,red, tender, left little toe x2 days and worsening. He also has swelling to the left foot and ankle. Rates the pain 8/10. It is sensitive even with socks on. Denies any injury. Denies any history of gout. However, reports eating double strength today prior. Denies any alcohol use.   Review of Systems  Constitutional: Negative.   Respiratory: Negative.   Cardiovascular: Negative.   Endocrine: Negative.   Musculoskeletal: Positive for joint swelling and arthralgias.       Left pinky toe and foot swollen  Skin: Negative.   Allergic/Immunologic: Negative.   Neurological: Negative.   Hematological: Negative.   Psychiatric/Behavioral: Negative.    Past Medical History  Diagnosis Date  . Hyperlipidemia   . Hypertension   . ED (erectile dysfunction)   . Proteinuria     past record showed 500mg  protein excretion per 24 hours eval by renal  . PTSD (post-traumatic stress disorder)     from [place] experience [place]  . Normal nuclear stress test     myoview neg 12 09 except for HT  . Other and unspecified alcohol dependence, unspecified drinking behavior     date of last use 02/13/11  . [facility]  . Diabetes mellitus without complication     History   Social History  . Marital Status: Legally Separated    Spouse Name: N/A    Number of Children: N/A  . Years of Education: N/A   Occupational History  . Not on file.   Social History Main Topics  . Smoking status: Former Research scientist (life sciences)  . Smokeless tobacco: Not on file  . Alcohol Use: 30.0 oz/week    50 Shots of liquor per week     Comment: Pt in treatment for alcohol dependence. Last date of use 02/13/11.  . Drug Use: No  . Sexual Activity: Not on file   Other Topics Concern  . 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   Working with disabled adults.    Plays golf weekly                 Past Surgical History  Procedure Laterality Date  . Ls spinal surgery    . [place] myoview ltd  12/09    neg except for HT    Family History  Problem Relation Age of Onset  . Diabetes Mother   . Hypertension Mother   . Other Mother     nerves  . Throat cancer Father     deceased  . Coronary artery disease Sister   . Diabetes Sister   . Heart disease Sister   . Hypertension Brother   . Alcohol abuse Paternal Aunt   . Alcohol abuse Cousin   . Drug abuse Son     cocaine dependent    Allergies  Allergen Reactions  .  Codeine Phosphate     REACTION: unspecified    Current Outpatient Prescriptions on File Prior to Visit  Medication Sig Dispense Refill  . amLODipine (NORVASC) 5 MG tablet Take 5 mg by mouth daily.       Marland Kitchen aspirin 81 MG chewable tablet Chew 81 mg by mouth daily.        Marland Kitchen atorvastatin (LIPITOR) 20 MG tablet Take 1 tablet by mouth  daily  90 tablet  2  . cetirizine (ZYRTEC) 10 MG tablet Take 10 mg by mouth 2 (two) times daily.       . mirtazapine (REMERON) 15 MG tablet Take 1/2 to one tablet by mouth at bedtime as needed for sleep  30 tablet  0  . Multiple Vitamins-Minerals (CENTRUM SILVER PO) Take by mouth.        Marland Kitchen omeprazole (PRILOSEC) 20 MG capsule Take 20 mg by mouth daily. From the [place]      . tadalafil (CIALIS) 20 MG tablet Take 20 mg by mouth daily as needed.        Marland Kitchen telmisartan (MICARDIS) 80 MG tablet Take 1 tablet (80 mg total) by mouth 2 (two) times daily.  180 tablet  4  . triamterene-hydrochlorothiazide (MAXZIDE-25) 37.5-25 MG per tablet Take 1  tablet by mouth  daily  90 tablet  3   No current facility-administered medications on file prior to visit.    BP 124/60  Pulse 71  Wt 230 lb (104.327 kg)  BMI 33 kg/m2chart    Objective:   Physical Exam  Constitutional: He is oriented to person, place, and time. He appears well-developed and well-nourished.  Cardiovascular: Normal rate, regular rhythm and normal heart sounds.   Pulmonary/Chest: Effort normal and breath sounds normal.  Musculoskeletal: He exhibits edema and tenderness.       Feet:  Neurological: He is oriented to person, place, and time.      Toradol 60 mg IM x1 given.    Assessment & Plan:  Assessment: 1. Gout  2. Left foot pain  Plan: Indomethacin 25 mg 2 tablets 3 times a day until pain resolves. Uric acid level sent. Will notify patient of the results.

## 2012-11-30 NOTE — Telephone Encounter (Signed)
Patient Information:  Caller Name: Darin  Phone: [phone]  Patient: Jeremy Black, Jeremy Black  Gender: Male  DOB: Feb 17, 1946  Age: 67 Years  PCP: Shanon Ace (Family Practice)  Office Follow Up:  Does the office need to follow up with this patient?: No  Instructions For The Office: N/A  RN Note:  Patient states he is prediabetic and awakened 11/29/12 with swelling of the left foot.  States the swelling is now going above the ankle.  Right foot does not have swelling.  Toes are swollen, and the foot is very painful.  States the swelling began in the 5th toe and spread "across the great toe."  States has had two back surgeries in the past, and is using his crutch to walk due to pain.  Per leg swelling protocol, advised appt/first available due to "can't walk or can barely stand."  Appt scheduled with Roxy Cedar PA 1345 11/30/12.  krs/can  Symptoms  Reason For Call & Symptoms: left foot swollen  Reviewed Health History In EMR: Yes  Reviewed Medications In EMR: Yes  Reviewed Allergies In EMR: Yes  Reviewed Surgeries / Procedures: Yes  Date of Onset of Symptoms: 11/29/2012  Guideline(s) Used:  Leg Swelling and Edema  Disposition Per Guideline:   Go to ED Now (or to Office with PCP Approval)  Reason For Disposition Reached:   Can't walk or can barely stand (new onset)  Advice Given:  N/A  Patient Will Follow Care Advice:  YES  Appointment Scheduled:  11/30/2012 13:45:00 Appointment Scheduled Provider:  Roxy Cedar [facility])

## 2012-11-30 NOTE — Telephone Encounter (Signed)
Pt states that his foot is swollen and he would like to be referred to see a podiatrist asap. He states that he has recently been diagnosed with diabetes. Please assist.

## 2012-11-30 NOTE — Telephone Encounter (Signed)
Patient seen Padonda on 11/30/12

## 2013-01-04 ENCOUNTER — Telehealth: Payer: Self-pay | Admitting: Internal Medicine

## 2013-01-04 NOTE — Telephone Encounter (Signed)
Pt states that he is pre-diabetic, and having trouble with his left foot. He would like to be referred to a podiatrist. Please assist.

## 2013-01-07 NOTE — Telephone Encounter (Signed)
Please   Define what  The foot problem is for the record    Is ok to do podiatry referral if needed

## 2013-01-08 ENCOUNTER — Other Ambulatory Visit: Payer: Self-pay | Admitting: Family Medicine

## 2013-01-08 DIAGNOSIS — M79671 Pain in right foot: Secondary | ICD-10-CM

## 2013-01-08 DIAGNOSIS — R7303 Prediabetes: Secondary | ICD-10-CM

## 2013-01-08 NOTE — Telephone Encounter (Signed)
Pt would like to see dr Orland Mustard. For his refferral. [facility]. pls call pt and let him know this is ok.

## 2013-01-08 NOTE — Telephone Encounter (Signed)
Spoke to the pt.  He is have pain in his left foot near his small toe that radiates.  Pain on his right foot at his heel.  He is concerned about his pre diabetes diagnoses.  His mother is seen at [place].  Would like to be referred there.  Order placed in the system.

## 2013-01-11 ENCOUNTER — Ambulatory Visit (INDEPENDENT_AMBULATORY_CARE_PROVIDER_SITE_OTHER): Payer: Medicare Other | Admitting: Family Medicine

## 2013-01-11 ENCOUNTER — Encounter: Payer: Self-pay | Admitting: Family Medicine

## 2013-01-11 VITALS — BP 147/85 | HR 45 | Ht 70.0 in | Wt 230.0 lb

## 2013-01-11 DIAGNOSIS — M19049 Primary osteoarthritis, unspecified hand: Secondary | ICD-10-CM

## 2013-01-11 DIAGNOSIS — M189 Osteoarthritis of first carpometacarpal joint, unspecified: Secondary | ICD-10-CM

## 2013-01-11 NOTE — Progress Notes (Signed)
  Subjective:    Patient ID: Jeremy Black, male    DOB: 03-06-1946, 67 y.o.   MRN: [id number]  HPI  Bilateral but left greater than right thumb pain. Had a corticosteroid injection in the left thumb May, 2014. This is his first steroid injection and it pretty much we'll leave all of his pain until about 3 weeks ago. Both his thumbs hurt now, worse with activity especially she's trying to open a jar. They do not seem to bother him while he is playing golf. She's had no numbness in the hands.  Review of Systems No unusual weight loss, no numbness or tingling in the hands. The thumb joints have not been red but they feel somewhat swollen.    Objective:   Physical Exam  Vital signs are reviewed GENERAL: Well-developed male no acute distress THUMBS: Bilaterally mild tenderness to palpation at the [facility] joint. The right [facility] joint has a lot of synovitis and external changes of osteoarthritis. He has full range of motion in all planes of the thumb bilaterally. IMAGING: Reviewed his ultrasound pictures of bilateral CMC joints. The right one particularly has very little  INJECTION: Patient was given informed consent, signed copy in the chart. Appropriate time out was taken. Area prepped and draped in usual sterile fashion. One half cc of methylprednisolone 40 mg/ml plus  one cc of 1% lidocaine without epinephrine was injected into the bilateral CMC joints using a(n) lateral approach. The patient tolerated the procedure well. There were no complications. Post procedure instructions were given.  joint space.      Assessment & Plan:

## 2013-01-16 ENCOUNTER — Other Ambulatory Visit: Payer: Self-pay | Admitting: Internal Medicine

## 2013-01-21 DIAGNOSIS — B351 Tinea unguium: Secondary | ICD-10-CM

## 2013-01-28 ENCOUNTER — Ambulatory Visit (INDEPENDENT_AMBULATORY_CARE_PROVIDER_SITE_OTHER): Payer: Medicare Other | Admitting: Podiatry

## 2013-01-28 ENCOUNTER — Ambulatory Visit (INDEPENDENT_AMBULATORY_CARE_PROVIDER_SITE_OTHER): Payer: Medicare Other

## 2013-01-28 ENCOUNTER — Encounter: Payer: Self-pay | Admitting: Podiatry

## 2013-01-28 VITALS — BP 155/76 | HR 54 | Resp 16 | Ht 71.0 in | Wt 226.0 lb

## 2013-01-28 DIAGNOSIS — R52 Pain, unspecified: Secondary | ICD-10-CM

## 2013-01-28 DIAGNOSIS — B351 Tinea unguium: Secondary | ICD-10-CM

## 2013-01-28 DIAGNOSIS — E119 Type 2 diabetes mellitus without complications: Secondary | ICD-10-CM

## 2013-01-28 NOTE — Progress Notes (Signed)
Subjective:     Patient ID: Jeremy Black, male   DOB: 04-12-1946, 67 y.o.   MRN: [id number]  Foot Pain   patient presents stating he was just diagnosed with diabetes and he wanted to speak checked and want to know how to cut his toenails properly. Also was concerned about mild pain on the outside of his feet   Review of Systems  All other systems reviewed and are negative.       Objective:   Physical Exam  Nursing note and vitals reviewed. Constitutional: He appears well-developed and well-nourished.  Cardiovascular: Intact distal pulses.   Musculoskeletal: Normal range of motion.  Neurological: He is alert.   Patient has normal muscle and no other issues noted   his nails do have some thickness to dampen the left hallux nail is lifted. Diabetes under very good control Assessment:     New onset diabetic with minimal foot symptoms and mycotic nail infection left hallux    Plan:     H&P performed and diabetic education render. Debridement nailbeds was accomplished and instructions on doing this was given formula 3 was dispensed for nail infection

## 2013-01-28 NOTE — Patient Instructions (Signed)
Call if you have any problems

## 2013-01-28 NOTE — Progress Notes (Signed)
  Subjective:    Patient ID: Jeremy Black, male    DOB: 12-08-1945, 67 y.o.   MRN: [id number] "I have pain on the side of my feet.  I was just diagnosed with Diabetes.  I want my toenails trimmed." Foot Pain This is a new problem. The current episode started more than 1 month ago. The problem occurs intermittently. The problem has been unchanged. The symptoms are aggravated by walking. He has tried rest for the symptoms. The treatment provided mild relief.      Review of Systems     Objective:   Physical Exam        Assessment & Plan:

## 2013-02-18 ENCOUNTER — Other Ambulatory Visit: Payer: Self-pay

## 2013-02-23 ENCOUNTER — Ambulatory Visit (INDEPENDENT_AMBULATORY_CARE_PROVIDER_SITE_OTHER): Payer: Medicare Other | Admitting: Internal Medicine

## 2013-02-23 ENCOUNTER — Encounter: Payer: Self-pay | Admitting: Internal Medicine

## 2013-02-23 VITALS — BP 134/84 | HR 88 | Temp 98.4°F | Wt 227.0 lb

## 2013-02-23 DIAGNOSIS — R252 Cramp and spasm: Secondary | ICD-10-CM | POA: Insufficient documentation

## 2013-02-23 DIAGNOSIS — N508 Other specified disorders of male genital organs: Secondary | ICD-10-CM

## 2013-02-23 DIAGNOSIS — R7309 Other abnormal glucose: Secondary | ICD-10-CM

## 2013-02-23 DIAGNOSIS — N538 Other male sexual dysfunction: Secondary | ICD-10-CM

## 2013-02-23 DIAGNOSIS — E119 Type 2 diabetes mellitus without complications: Secondary | ICD-10-CM

## 2013-02-23 DIAGNOSIS — I10 Essential (primary) hypertension: Secondary | ICD-10-CM

## 2013-02-23 DIAGNOSIS — R7303 Prediabetes: Secondary | ICD-10-CM

## 2013-02-23 DIAGNOSIS — N289 Disorder of kidney and ureter, unspecified: Secondary | ICD-10-CM

## 2013-02-23 LAB — BASIC METABOLIC PANEL
BUN: 17 mg/dL (ref 6–23)
Chloride: 101 mEq/L (ref 96–112)
Creatinine, Ser: 1.4 mg/dL (ref 0.4–1.5)
GFR: 62.84 mL/min (ref >60.00)
Glucose, Bld: 92 mg/dL (ref 70–99)

## 2013-02-23 LAB — HEMOGLOBIN A1C: Hgb A1c MFr Bld: 7 % — ABNORMAL HIGH (ref 4.6–6.5)

## 2013-02-23 LAB — CK: Total CK: 391 U/L — ABNORMAL HIGH (ref 7–232)

## 2013-02-23 LAB — MAGNESIUM: Magnesium: 1.9 mg/dL (ref 1.5–2.5)

## 2013-02-23 NOTE — Progress Notes (Signed)
Chief Complaint  Patient presents with  . Follow-up    multiple issues    HPI: Patient comes in today for follow up of  multiple medical problems.  Foot saw podiatry  Nail issue  to return prn   Dm  Trying to eat better   Concern a bout  Sexual dysfunction   Not seemingly erectile    bp is  Ok  70  syst  140  Tried zxinc tablets. 50   Stress  Better.  rare etoh.   Cannot explain this  Asks advice ? If could be testosterone.  Nocturia x 2   No change in back situation  Numbness weakness i leg but  Walks a good bit with a cane   Intermittent sharp right groin pain comes and goes  No hip extension  [facility]  [facility] .    To see pscyh in march .  ROS: See pertinent positives and negatives per HPI. Leg cramps at times  And sometimes arm cramps  Past Medical History  Diagnosis Date  . Hyperlipidemia   . Hypertension   . ED (erectile dysfunction)   . Proteinuria     past record showed 500mg  protein excretion per 24 hours eval by renal  . PTSD (post-traumatic stress disorder)     from [place] experience [place]  . Normal nuclear stress test     myoview neg 12 09 except for HT  . Other and unspecified alcohol dependence, unspecified drinking behavior     date of last use 02/13/11  . [facility]  . Diabetes mellitus without complication   . Allergy   . Arthritis   . Cataract   . GERD (gastroesophageal reflux disease)     Family History  Problem Relation Age of Onset  . Diabetes Mother   . Hypertension Mother   . Other Mother     nerves  . Throat cancer Father     deceased  . Coronary artery disease Sister   . Diabetes Sister   . Heart disease Sister   . Hypertension Brother   . Alcohol abuse Paternal Aunt   . Alcohol abuse Cousin   . Drug abuse Son     cocaine dependent    History   Social History  . Marital Status: Legally Separated    Spouse Name: N/A    Number of Children: N/A  . Years of  Education: N/A   Social History Main Topics  . Smoking status: Former Research scientist (life sciences)  . Smokeless tobacco: None  . Alcohol Use: 30.0 oz/week    50 Shots of liquor per week     Comment: Pt in treatment for alcohol dependence. Last date of use 02/13/11.,  Very very little 01/27/13  . Drug Use: No  . Sexual Activity: None   Other Topics Concern  . 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   Working with disabled adults.    Plays golf weekly  Outpatient Encounter Prescriptions as of 02/23/2013  Medication Sig  . amLODipine (NORVASC) 5 MG tablet Take 5 mg by mouth daily.   Marland Kitchen aspirin 81 MG chewable tablet Chew 81 mg by mouth daily.    . cetirizine (ZYRTEC) 10 MG tablet Take 10 mg by mouth 2 (two) times daily.   . mirtazapine (REMERON) 15 MG tablet Take 1/2 to one tablet by mouth at bedtime as needed for sleep  . Multiple Vitamins-Minerals (CENTRUM SILVER PO) Take by mouth.    Marland Kitchen omeprazole (PRILOSEC) 20 MG capsule Take 20 mg by mouth daily. From the [place]  . telmisartan (MICARDIS) 80 MG tablet Take 1 tablet (80 mg total) by mouth 2 (two) times daily.  Marland Kitchen triamterene-hydrochlorothiazide (MAXZIDE-25) 37.5-25 MG per tablet Take 1 tablet by mouth  daily  . [DISCONTINUED] tadalafil (CIALIS) 20 MG tablet Take 20 mg by mouth daily as needed.      EXAM:  BP 134/84  Pulse 88  Temp(Src) 98.4 F (36.9 C) (Oral)  Wt 227 lb (102.967 kg)  SpO2 96%  Body mass index is 31.67 kg/(m^2).  GENERAL: vitals reviewed and listed above, alert, oriented, appears well hydrated and in no acute distress wellcgroomed  In nad loods  antalgic walk with cane  HEENT: atraumatic, conjunctiva   clear, no obvious abnormalities on inspection of external nose and ears  NECK: no obvious masses on inspection palpation no jvd  LUNGS: clear to auscultation bilaterally, no wheezes, rales or rhonchi, good air movement CV: HRRR, no clubbing cyanosis or  peripheral edema nl cap refill foot exam per podiatry recently  MS: moves all extremities   PSYCH: pleasant and cooperative, no obvious depression or anxiety Lab Results  Component Value Date   WBC 7.6 06/30/2012   HGB 13.8 06/30/2012   HCT 41.5 06/30/2012   PLT 296.0 06/30/2012   GLUCOSE 92 02/23/2013   CHOL 135 06/30/2012   TRIG 197.0* 06/30/2012   HDL 24.80* 06/30/2012   LDLDIRECT 66.7 01/31/2011   LDLCALC 71 06/30/2012   ALT 20 06/30/2012   AST 21 06/30/2012   NA 137 02/23/2013   K 4.2 02/23/2013   CL 101 02/23/2013   CREATININE 1.4 02/23/2013   BUN 17 02/23/2013   CO2 28 02/23/2013   TSH 0.76 07/09/2012   PSA 0.82 09/07/2009   HGBA1C 7.0* 02/23/2013   MICROALBUR 141.5* 09/14/2009    ASSESSMENT AND PLAN:  Discussed the following assessment and plan:  Diabetes mellitus, new onset - Plan: Hemoglobin 123456, Basic metabolic panel, Magnesium, CK  HYPERTENSION - Plan: Hemoglobin 123456, Basic metabolic panel, Magnesium, CK  Renal insufficiency  cr 1.3 range - Plan: Hemoglobin 123456, Basic metabolic panel, Magnesium, CK  Pre-diabetes - Plan: Hemoglobin 123456, Basic metabolic panel, Magnesium, CK  Leg cramps - Plan: Ambulatory referral to Urology  Other male sexual dysfunction - refer to urology for consult dont think med causing  at this time  - Plan: Ambulatory referral to Urology Signed handicapped placard -Patient advised to return or notify health care team  if symptoms worsen or persist or new concerns arise.  Patient Instructions  Exercise helps  You medical issues . Plan to get uroilogy consult otherwise .  ROV in 4-6 months depending on labs and how you are doing.   Standley Brooking. Panosh M.D.   After patient left I thought he was  still on atorvastatin but I don't see it on his med list. should be on this unless  Causing se.  Also thought he had had  pna vaccine  But non in the record   Also askin  Flu vaccine to get ?

## 2013-02-23 NOTE — Patient Instructions (Signed)
Exercise helps  You medical issues . Plan to get uroilogy consult otherwise .  ROV in 4-6 months depending on labs and how you are doing.

## 2013-03-01 ENCOUNTER — Telehealth: Payer: Self-pay | Admitting: Internal Medicine

## 2013-03-01 NOTE — Telephone Encounter (Signed)
Pt would like results of labs done 11/11

## 2013-03-02 NOTE — Telephone Encounter (Signed)
See result notes. 

## 2013-03-03 NOTE — Telephone Encounter (Signed)
Patient notified by telephone. 

## 2013-03-19 ENCOUNTER — Other Ambulatory Visit: Payer: Self-pay | Admitting: Family Medicine

## 2013-03-19 ENCOUNTER — Telehealth: Payer: Self-pay | Admitting: Internal Medicine

## 2013-03-19 MED ORDER — TADALAFIL 20 MG PO TABS: 20.0000 mg | ORAL_TABLET | Freq: Every day | ORAL | Status: DC | PRN

## 2013-03-19 NOTE — Telephone Encounter (Signed)
Patient notified there are no samples.  Sent in rx to local pharmacy for #10.

## 2013-03-19 NOTE — Telephone Encounter (Addendum)
Pt would like samples and rx for tadalafil (CIALIS) 20 MG tablet  Pt going to [facility] today and would like asap! Pharm: [facility] [address]

## 2013-03-29 ENCOUNTER — Telehealth: Payer: Self-pay | Admitting: Internal Medicine

## 2013-03-29 NOTE — Telephone Encounter (Signed)
Patient Information:  Caller Name: [name]  Phone: [phone]  Patient: Jeremy Black, Jeremy Black  Gender: Male  DOB: [birth year]  Age: 67 Years  PCP: Shanon Ace (Family Practice)  Office Follow Up:  Does the office need to follow up with this patient?: Yes  Instructions For The Office: Wants to know if Metformin should be started now since blood sugars have been between 160-170 in the mornings.  Please f/u with Olga Millers.  RN Note:  Hamish states he has been monitoring his blood sugar in the morning.  States Dr. Regis Bill said it needs to be less than 130 each morning.  It has been between 160-170 in the mornings.  Wants to know if Metformin should be started now.  Symptoms  Reason For Call & Symptoms: BS 160-170 in the mornings  Reviewed Health History In EMR: Yes  Reviewed Medications In EMR: Yes  Reviewed Allergies In EMR: Yes  Reviewed Surgeries / Procedures: Yes  Date of Onset of Symptoms: Unknown  Guideline(s) Used:  Diabetes - High Blood Sugar  Disposition Per Guideline:   Home Care  Reason For Disposition Reached:   Blood glucose 60-240 mg/dl (3.5 -13 mmol/l)  Advice Given:  N/A  Patient Will Follow Care Advice:  YES

## 2013-03-29 NOTE — Telephone Encounter (Signed)
Reviewed WP's last note.  Do not see where she suggests starting the metformin.  Please advise.  Thanks!

## 2013-03-29 NOTE — Telephone Encounter (Signed)
This can wait until Dr. Regis Bill returns

## 2013-03-31 ENCOUNTER — Other Ambulatory Visit: Payer: Self-pay | Admitting: Family Medicine

## 2013-03-31 ENCOUNTER — Telehealth: Payer: Self-pay

## 2013-03-31 NOTE — Telephone Encounter (Signed)
Needs Metformin from Dr Regis Bill - Main Pharmacy is [facility] on [facility]

## 2013-04-01 ENCOUNTER — Telehealth: Payer: Self-pay | Admitting: Cardiovascular Disease

## 2013-04-01 NOTE — Telephone Encounter (Signed)
Walk in pt Form " Pharmacy Verification"" gave to [facility]

## 2013-04-01 NOTE — Telephone Encounter (Signed)
Left message on home phone for the pt to return my call.  Do not see this medication in his current list or past hx.

## 2013-04-02 NOTE — Telephone Encounter (Signed)
Left message at home number for the pt to return my call.

## 2013-04-05 ENCOUNTER — Other Ambulatory Visit: Payer: Self-pay | Admitting: Family Medicine

## 2013-04-05 ENCOUNTER — Telehealth: Payer: Self-pay | Admitting: *Deleted

## 2013-04-05 MED ORDER — METFORMIN HCL ER 500 MG PO TB24: 500.0000 mg | ORAL_TABLET | Freq: Every day | ORAL | Status: DC

## 2013-04-05 NOTE — Telephone Encounter (Signed)
[facility] fax, Telmisartan 80 mg tablet 2 tablets daily is approved through 04/14/2014, PA # [id number].

## 2013-04-05 NOTE — Telephone Encounter (Signed)
Patient notified to pick up rx and the pharmacy.  Pt also requested a 90 day supply be sent to [facility].  Sent by e-scribe.

## 2013-04-05 NOTE — Telephone Encounter (Signed)
Sent to the pharmacy by e-scribe. 

## 2013-04-05 NOTE — Telephone Encounter (Signed)
We can begin metformin ER 500 mg per day with meal Disp 30  Refill x 2   ROV in 1-2 month to assess response

## 2013-07-05 ENCOUNTER — Telehealth: Payer: Self-pay | Admitting: Family Medicine

## 2013-07-05 ENCOUNTER — Other Ambulatory Visit (INDEPENDENT_AMBULATORY_CARE_PROVIDER_SITE_OTHER): Payer: Medicare Other

## 2013-07-05 DIAGNOSIS — E1110 Type 2 diabetes mellitus with ketoacidosis without coma: Secondary | ICD-10-CM

## 2013-07-05 DIAGNOSIS — E1310 Other specified diabetes mellitus with ketoacidosis without coma: Secondary | ICD-10-CM
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
## 2013-07-05 NOTE — Telephone Encounter (Signed)
Spoke to the Jeremy Black to inform him that I had left a message on Dr. Lacy Duverney assistants voicemail and will wait for her return call to make an appt.

## 2013-07-06 LAB — HEMOGLOBIN A1C: HEMOGLOBIN A1C: 7 % — AB (ref 4.6–6.5)

## 2013-07-06 LAB — CK: Total CK: 291 U/L — ABNORMAL HIGH (ref 7–232)

## 2013-07-06 NOTE — Telephone Encounter (Signed)
Called and spoke to the pt.  Informed him that his appointment in on 07/12/13 @ 2:30.

## 2013-08-04 ENCOUNTER — Other Ambulatory Visit: Payer: Self-pay | Admitting: Internal Medicine

## 2013-08-24 ENCOUNTER — Other Ambulatory Visit: Payer: Self-pay | Admitting: Cardiovascular Disease

## 2013-09-12 ENCOUNTER — Emergency Department (HOSPITAL_COMMUNITY)
Admission: EM | Admit: 2013-09-12 | Discharge: 2013-09-12 | Disposition: A | Discharge status: Home / Self Care | Payer: Medicare Other | Attending: Emergency Medicine | Admitting: Emergency Medicine

## 2013-09-12 ENCOUNTER — Encounter (HOSPITAL_COMMUNITY): Payer: Self-pay | Admitting: Emergency Medicine

## 2013-09-12 ENCOUNTER — Emergency Department (HOSPITAL_COMMUNITY): Payer: Medicare Other

## 2013-09-12 DIAGNOSIS — I10 Essential (primary) hypertension: Secondary | ICD-10-CM

## 2013-09-12 DIAGNOSIS — Z8669 Personal history of other diseases of the nervous system and sense organs: Secondary | ICD-10-CM | POA: Insufficient documentation

## 2013-09-12 DIAGNOSIS — E119 Type 2 diabetes mellitus without complications: Secondary | ICD-10-CM | POA: Insufficient documentation

## 2013-09-12 DIAGNOSIS — M129 Arthropathy, unspecified: Secondary | ICD-10-CM | POA: Insufficient documentation

## 2013-09-12 DIAGNOSIS — Z87448 Personal history of other diseases of urinary system: Secondary | ICD-10-CM | POA: Insufficient documentation

## 2013-09-12 DIAGNOSIS — R42 Dizziness and giddiness: Secondary | ICD-10-CM | POA: Insufficient documentation

## 2013-09-12 DIAGNOSIS — Z7982 Long term (current) use of aspirin: Secondary | ICD-10-CM | POA: Insufficient documentation

## 2013-09-12 DIAGNOSIS — F4310 Post-traumatic stress disorder, unspecified: Secondary | ICD-10-CM | POA: Insufficient documentation

## 2013-09-12 DIAGNOSIS — Z87891 Personal history of nicotine dependence: Secondary | ICD-10-CM | POA: Insufficient documentation

## 2013-09-12 DIAGNOSIS — R51 Headache: Secondary | ICD-10-CM | POA: Insufficient documentation

## 2013-09-12 DIAGNOSIS — Z79899 Other long term (current) drug therapy: Secondary | ICD-10-CM | POA: Insufficient documentation

## 2013-09-12 DIAGNOSIS — K219 Gastro-esophageal reflux disease without esophagitis: Secondary | ICD-10-CM | POA: Insufficient documentation

## 2013-09-12 LAB — CBC
HCT: 42 % (ref 39.0–52.0)
HEMOGLOBIN: 13.8 g/dL (ref 13.0–17.0)
MCH: 26.7 pg (ref 26.0–34.0)
MCHC: 32.9 g/dL (ref 30.0–36.0)
MCV: 81.4 fL (ref 78.0–100.0)
PLATELETS: 364 10*3/uL (ref 150–400)
RBC: 5.16 MIL/uL (ref 4.22–5.81)
RDW: 15.5 % (ref 11.5–15.5)
WBC: 6.1 10*3/uL (ref 4.0–10.5)

## 2013-09-12 LAB — BASIC METABOLIC PANEL
BUN: 10 mg/dL (ref 6–23)
CHLORIDE: 102 meq/L (ref 96–112)
CO2: 23 meq/L (ref 19–32)
Calcium: 9.2 mg/dL (ref 8.4–10.5)
Creatinine, Ser: 1.3 mg/dL (ref 0.50–1.35)
GFR calc Af Amer: 64 mL/min — ABNORMAL LOW (ref >90)
GFR calc non Af Amer: 55 mL/min — ABNORMAL LOW (ref >90)
GLUCOSE: 100 mg/dL — AB (ref 70–99)
POTASSIUM: 4 meq/L (ref 3.7–5.3)
SODIUM: 139 meq/L (ref 137–147)

## 2013-09-12 MED ORDER — AMLODIPINE BESYLATE 5 MG PO TABS
5.0000 mg | ORAL_TABLET | Freq: Once | ORAL | Status: AC
  Administered 2013-09-12: 5 mg via ORAL
  Filled 2013-09-12: qty 1

## 2013-09-12 MED ORDER — AMLODIPINE BESYLATE 5 MG PO TABS: 5.0000 mg | ORAL_TABLET | Freq: Every day | ORAL | Status: DC

## 2013-09-12 NOTE — Discharge Instructions (Signed)

## 2013-09-12 NOTE — ED Provider Notes (Signed)
CSN: [id number]     Arrival date & time 09/12/13  1653 History   First MD Initiated Contact with Patient 09/12/13 1748     Chief Complaint  Patient presents with  . Hypertension     (Consider location/radiation/quality/duration/timing/severity/associated sxs/prior Treatment) HPI 68 year old male presents with elevated blood pressure since last night. He states that after dinner he noticed a mild left-sided headache. He states he's had this before with blood pressures been high. He checked his blood pressure home and was 199/120. He denied any specific treatment and then this morning and today he is noticed consistently elevated blood pressures in the 180s over 100. He took his normal blood pressure medicines (maxzide and micardis) this morning. Has had an intermittent, mild headache. Felt dizzy after working out at the gym yesterday for a few seconds but no other dizziness. Denies any chest pain or shortness of breath. No focal weakness or numbness. No blurry vision. No confusion. He states he used to be on amlodipine but his prescription ran out and he never got it refilled. States his blood pressure was better controlled when he is on this. He does not recall what dose he was on. He currently feels asymptomatic.  Past Medical History  Diagnosis Date  . Hyperlipidemia   . Hypertension   . ED (erectile dysfunction)   . Proteinuria     past record showed 500mg  protein excretion per 24 hours eval by renal  . PTSD (post-traumatic stress disorder)     from [place] experience [place]  . Normal nuclear stress test     myoview neg 12 09 except for HT  . Other and unspecified alcohol dependence, unspecified drinking behavior     date of last use 02/13/11  . [facility]  . Diabetes mellitus without complication   . Allergy   . Arthritis   . Cataract   . GERD (gastroesophageal reflux disease)    Past Surgical History  Procedure Laterality Date  . Ls  spinal surgery    . Nm myoview ltd  12/09    neg except for HT   Family History  Problem Relation Age of Onset  . Diabetes Mother   . Hypertension Mother   . Other Mother     nerves  . Throat cancer Father     deceased  . Coronary artery disease Sister   . Diabetes Sister   . Heart disease Sister   . Hypertension Brother   . Alcohol abuse Paternal Aunt   . Alcohol abuse Cousin   . Drug abuse Son     cocaine dependent   History  Substance Use Topics  . Smoking status: Former Research scientist (life sciences)  . Smokeless tobacco: Not on file  . Alcohol Use: 30.0 oz/week    50 Shots of liquor per week     Comment: Pt in treatment for alcohol dependence. Last date of use 02/13/11.,  Very very little 01/27/13    Review of Systems  Constitutional: Negative for fever.  Eyes: Negative for visual disturbance.  Respiratory: Negative for shortness of breath.   Cardiovascular: Negative for chest pain.  Gastrointestinal: Negative for vomiting and abdominal pain.  Genitourinary: Negative for difficulty urinating.  Neurological: Positive for dizziness and headaches. Negative for weakness and numbness.  All other systems reviewed and are negative.     Allergies  Codeine phosphate  Home Medications   Prior to Admission medications   Medication Sig Start Date End Date  Taking? Authorizing Provider  aspirin 81 MG chewable tablet Chew 81 mg by mouth daily.    Yes Historical Provider, MD  cetirizine (ZYRTEC) 10 MG tablet Take 10 mg by mouth 2 (two) times daily.    Yes Historical Provider, MD  MAGNESIUM PO Take 1 tablet by mouth daily as needed (cramp).   Yes Historical Provider, MD  metFORMIN (GLUCOPHAGE-XR) 500 MG 24 hr tablet Take 1 tablet (500 mg total) by mouth daily with breakfast. 04/05/13  Yes [name], MD  Multiple Vitamins-Minerals (CENTRUM SILVER PO) Take 1 tablet by mouth daily.    Yes Historical Provider, MD  Multiple Vitamins-Minerals (ZINC PO) Take 1 tablet by mouth daily as needed  (cramps).   Yes Historical Provider, MD  omeprazole (PRILOSEC) 20 MG capsule Take 20 mg by mouth daily. From the [place]   Yes Historical Provider, MD  Potassium (POTASSIMIN PO) Take 1 tablet by mouth daily as needed (cramps).   Yes Historical Provider, MD  telmisartan (MICARDIS) 80 MG tablet Take 1 tablet by mouth 2  times daily.   Yes Josue Hector, MD  traZODone (DESYREL) 50 MG tablet Take 50 mg by mouth at bedtime as needed for sleep.   Yes Historical Provider, MD  triamterene-hydrochlorothiazide (MAXZIDE-25) 37.5-25 MG per tablet Take 1 tablet by mouth  daily 08/04/13  Yes [name], MD  tadalafil (CIALIS) 20 MG tablet Take 1 tablet (20 mg total) by mouth daily as needed. 03/19/13   [name], MD   BP 213/96  Temp(Src) 98.4 F (36.9 C) (Oral)  Resp 20  SpO2 96% Physical Exam  Nursing note and vitals reviewed. Constitutional: He is oriented to person, place, and time. He appears well-developed and well-nourished.  HENT:  Head: Normocephalic and atraumatic.  Right Ear: External ear normal.  Left Ear: External ear normal.  Nose: Nose normal.  Eyes: EOM are normal. Pupils are equal, round, and reactive to light. Right eye exhibits no discharge. Left eye exhibits no discharge.  Neck: Neck supple.  Cardiovascular: Normal rate, regular rhythm, normal heart sounds and intact distal pulses.   Pulmonary/Chest: Effort normal and breath sounds normal.  Abdominal: Soft. He exhibits no distension. There is no tenderness.  Musculoskeletal: He exhibits no edema.  Neurological: He is alert and oriented to person, place, and time. Gait normal. GCS eye subscore is 4. GCS verbal subscore is 5. GCS motor subscore is 6.  CN 2-12 grossly intact. 5/5 strength in all 4 extremities.   Skin: Skin is warm and dry.    ED Course  Procedures (including critical care time) Labs Review Labs Reviewed  BASIC METABOLIC PANEL - Abnormal; Notable for the following:    Glucose, Bld 100 (*)    GFR calc non Af  Amer 55 (*)    GFR calc Af Amer 64 (*)    All other components within normal limits  CBC    Imaging Review Ct Head Wo Contrast  09/12/2013   CLINICAL DATA:  Hypertension, intermittent headache, dizziness  EXAM: CT HEAD WITHOUT CONTRAST  TECHNIQUE: Contiguous axial images were obtained from the base of the skull through the vertex without intravenous contrast.  COMPARISON:  None.  FINDINGS: Mild age-related atrophy. No hydrocephalus infarct mass hemorrhage or extra-axial fluid. Calvarium is intact. No significant inflammatory change in the visualized paranasal sinuses.  IMPRESSION: No acute intracranial abnormalities.   Electronically Signed   By: Skipper Cliche M.D.   On: 09/12/2013 19:00     Date: 09/12/2013  Rate: 54  Rhythm: sinus bradycardia  QRS Axis: normal  Intervals: normal  ST/T Wave abnormalities: normal  Conduction Disutrbances:none  Narrative Interpretation:   Old EKG Reviewed: unchanged    MDM   Final diagnoses:  Hypertension    Patient is well appearing here, BP has gradually decreased from initial triage BP of 213/96. Patient is not currently having any dizziness, headaches, or other concerning signs or symptoms. His blood work is normal. It is intermittent headache a CT was obtained to have low suspicion for acute intracranial pathology. Given the symptoms seemed better controlled on Norvasc and old records indicate he was likely on 5 mg per day, I will restart him on this and give him his first dose in the ER tonight. He will call his PCP in the morning to help get better blood pressure control. He is still currently hypertensive but as he has no signs of endorgan damage or hypertensive emergency, I feel is safe for discharge with close followup.     Ephraim Hamburger, MD 09/12/13 2318

## 2013-09-12 NOTE — ED Notes (Signed)
He states he had a "mild" left-sided peri-orbital area h/a yesterday, and he recognized this as how he has felt in the past when his b/p had been elevated.  He has a home b/p cuff, and was found to have b/p of ~180/100 at that time.  He also states he has had some recent stressors, including the death of his mother.  He sees [facility] in [place], [place]; and also tells me that at one time he had been on Norvasc "but I ran out and my doctor wouldn't renew the prescription--they gave that to me because years ago I had protein in my urine".  He denies h/a at present and he is in on distress.

## 2013-09-13 ENCOUNTER — Telehealth: Payer: Self-pay | Admitting: Internal Medicine

## 2013-09-13 NOTE — Telephone Encounter (Signed)
Pt calling back for status update on appt request.

## 2013-09-13 NOTE — Telephone Encounter (Signed)
Pt was in ed sat for high blood pressure issues. Pt needs to fu asap. Pt's bp was very high and they started him an amolodipine. Pt needs appt at end of day around 3:30 due to his work. Is it ok to schedule.

## 2013-09-13 NOTE — Telephone Encounter (Signed)
Dr. Regis Bill has authorized a 30 minute visit on Tuesday or Wednesday.  Please schedule with the patient.  Thanks!

## 2013-09-14 ENCOUNTER — Encounter: Payer: Self-pay | Admitting: Internal Medicine

## 2013-09-14 ENCOUNTER — Ambulatory Visit (INDEPENDENT_AMBULATORY_CARE_PROVIDER_SITE_OTHER): Payer: Medicare Other | Admitting: Internal Medicine

## 2013-09-14 VITALS — BP 148/94 | Temp 98.6°F | Ht 71.0 in | Wt 225.0 lb

## 2013-09-14 DIAGNOSIS — Z23 Encounter for immunization: Secondary | ICD-10-CM

## 2013-09-14 DIAGNOSIS — E119 Type 2 diabetes mellitus without complications: Secondary | ICD-10-CM | POA: Insufficient documentation

## 2013-09-14 DIAGNOSIS — N289 Disorder of kidney and ureter, unspecified: Secondary | ICD-10-CM

## 2013-09-14 DIAGNOSIS — I10 Essential (primary) hypertension: Secondary | ICD-10-CM

## 2013-09-14 MED ORDER — AMLODIPINE BESYLATE 5 MG PO TABS: 5.0000 mg | ORAL_TABLET | Freq: Every day | ORAL | Status: DC

## 2013-09-14 NOTE — Progress Notes (Signed)
Chief Complaint  Patient presents with  . Follow-up    Hospital/BP    HPI: Here for fu with wife   Pt seen in ed for high bp   Had HA and noted to be up.  Was at a cook out and had MD  Some sodas recently  . Stayed high  Has been Off of amlodine  for a while  Months had been given by dr Clover Mealy and stopped taking  When rann out for a ling time.  Comes in for work in ed visit fu.   Still working  Has  Poor dreams   Uses as needed  trazadone  When needed .  ? When bp goes up 1-2 x per week.  Psych under care  150 +  Weeks.  Patient is well appearing here, BP has gradually decreased from initial triage BP of 213/96. Patient is not currently having any dizziness, headaches, or other concerning signs or symptoms. His blood work is normal. It is intermittent headache a CT was obtained to have low suspicion for acute intracranial pathology. Given the symptoms seemed better controlled on Norvasc and old records indicate he was likely on 5 mg per day, I will restart him on this and give him his first dose in the ER tonight. He will call his PCP in the morning to help get better blood pressure control. He is still currently hypertensive but as he has no signs of endorgan damage or hypertensive emergency, I feel is safe for discharge with close followup.  Jeremy Hamburger, MD  09/12/13 2318  ROS: See pertinent positives and negatives per HPI. No cp sob syncope neuro sx   Past Medical History  Diagnosis Date  . Hyperlipidemia   . Hypertension   . ED (erectile dysfunction)   . Proteinuria     past record showed 500mg  protein excretion per 24 hours eval by renal  . PTSD (post-traumatic stress disorder)     from [place] experience [place]  . Normal nuclear stress test     myoview neg 12 09 except for HT  . Other and unspecified alcohol dependence, unspecified drinking behavior     date of last use 02/13/11  . [facility]  . Diabetes mellitus without  complication   . Allergy   . Arthritis   . Cataract   . GERD (gastroesophageal reflux disease)     Family History  Problem Relation Age of Onset  . Diabetes Mother   . Hypertension Mother   . Other Mother     nerves  . Throat cancer Father     deceased  . Coronary artery disease Sister   . Diabetes Sister   . Heart disease Sister   . Hypertension Brother   . Alcohol abuse Paternal Aunt   . Alcohol abuse Cousin   . Drug abuse Son     cocaine dependent    History   Social History  . Marital Status: Legally Separated    Spouse Name: N/A    Number of Children: N/A  . Years of Education: N/A   Social History Main Topics  . Smoking status: Former Research scientist (life sciences)  . Smokeless tobacco: None  . Alcohol Use: 30.0 oz/week    50 Shots of liquor per week     Comment: Pt in treatment for alcohol dependence. Last date of use 02/13/11.,  Very very little 01/27/13  . Drug Use: No  . Sexual Activity:  None   Other Topics Concern  . 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   Working with disabled adults.    Plays golf weekly                       Outpatient Encounter Prescriptions as of 09/14/2013  Medication Sig  . amLODipine (NORVASC) 5 MG tablet Take 1 tablet (5 mg total) by mouth daily.  Marland Kitchen aspirin 81 MG chewable tablet Chew 81 mg by mouth daily.   . cetirizine (ZYRTEC) 10 MG tablet Take 10 mg by mouth 2 (two) times daily.   Marland Kitchen MAGNESIUM PO Take 1 tablet by mouth daily as needed (cramp).  . metFORMIN (GLUCOPHAGE-XR) 500 MG 24 hr tablet Take 1 tablet (500 mg total) by mouth daily with breakfast.  . Multiple Vitamins-Minerals (CENTRUM SILVER PO) Take 1 tablet by  mouth daily.   Marland Kitchen omeprazole (PRILOSEC) 20 MG capsule Take 20 mg by mouth daily. From the [place]  . Potassium (POTASSIMIN PO) Take 1 tablet by mouth daily as needed (cramps).  . sildenafil (VIAGRA) 100 MG tablet Take 50 mg by mouth as needed for erectile dysfunction.  Marland Kitchen telmisartan (MICARDIS) 80 MG tablet Take 1 tablet by mouth 2  times daily.  . traZODone (DESYREL) 50 MG tablet Take 50 mg by mouth at bedtime as needed for sleep.  Marland Kitchen triamterene-hydrochlorothiazide (MAXZIDE-25) 37.5-25 MG per tablet Take 1 tablet by mouth  daily  . [DISCONTINUED] amLODipine (NORVASC) 5 MG tablet Take 1 tablet (5 mg total) by mouth daily.  . tadalafil (CIALIS) 20 MG tablet Take 1 tablet (20 mg total) by mouth daily as needed.  . [DISCONTINUED] Multiple Vitamins-Minerals (ZINC PO) Take 1 tablet by mouth daily as needed (cramps).    EXAM:  BP 148/94  Temp(Src) 98.6 F (37 C) (Oral)  Ht 5\' 11"  (1.803 m)  Wt 225 lb (102.059 kg)  BMI 31.39 kg/m2  Body mass index is 31.39 kg/(m^2).  GENERAL: vitals reviewed and listed above, alert, oriented, appears well hydrated and in no acute distress HEENT: atraumatic, conjunctiva  clear, no obvious abnormalities on inspection of external nose and ears  NECK: no obvious masses on inspection palpation  LUNGS: clear to auscultation bilaterally, no wheezes, rales or rhonchi, good air movement CV: HRRR, no clubbing cyanosis or  peripheral edema nl cap refill  Abdomen:  Sof,t normal bowel sounds without hepatosplenomegaly, no guarding rebound or masses no CVA tenderness MS: moves all extremities without noticeable focal  abnormality PSYCH: pleasant and cooperative, no obvious depression or anxiety Lab Results  Component Value Date   WBC 6.1 09/12/2013   HGB 13.8 09/12/2013   HCT 42.0 09/12/2013   PLT 364 09/12/2013   GLUCOSE 100* 09/12/2013   CHOL 135 06/30/2012   TRIG 197.0* 06/30/2012   HDL 24.80* 06/30/2012   LDLDIRECT 66.7 01/31/2011   LDLCALC 71 06/30/2012   ALT 20 06/30/2012    AST 21 06/30/2012   NA 139 09/12/2013   K 4.0 09/12/2013   CL 102 09/12/2013   CREATININE 1.30 09/12/2013  BUN 10 09/12/2013   CO2 23 09/12/2013   TSH 0.76 07/09/2012   PSA 0.82 09/07/2009   HGBA1C 7.0* 07/05/2013   MICROALBUR 141.5* 09/14/2009    ASSESSMENT AND PLAN:  Discussed the following assessment and plan:  Unspecified essential hypertension - better already on amlo  continue and fu   Renal insufficiency - monitoring follow bp control  Diabetes mellitus, type 2 - trying weigh tloss in healthy manner no lows no new sxgaol reveiwed  Need for vaccination with 13-polyvalent pneumococcal conjugate vaccine - Plan: Pneumococcal conjugate vaccine 13-valent  -Patient advised to return or notify health care team  if symptoms worsen ,persist or new concerns arise.  Patient Instructions  Stay on the amlodipine  For blood pressure control. continue   Blood sugar  Monitoring. Continue lifestyle intervention healthy eating and exercise rov in 2 months   Bmp  Lipid panel  And HGBa1c pre visit . Marland Kitchen  Avoid simple sugars  Sweet tea etc.   Healthy weight loss can help blood pressure and diabetes .  And overall health .    Standley Brooking. Zaakirah Kistner M.D.  Pre visit review using our clinic review tool, if applicable. No additional management support is needed unless otherwise documented below in the visit note. Total visit 30 mins > 50% spent counseling and coordinating care

## 2013-09-14 NOTE — Telephone Encounter (Signed)
lmovm last night for pt to cb and sch appt today. This am pt returned my call and his bp was 101/140. Scheduled appt and relayed message.  Misty will cb pt.

## 2013-09-14 NOTE — Telephone Encounter (Signed)
Pt came in for appt.today,

## 2013-09-14 NOTE — Patient Instructions (Addendum)
Stay on the amlodipine  For blood pressure control. continue   Blood sugar  Monitoring. Continue lifestyle intervention healthy eating and exercise rov in 2 months   Bmp  Lipid panel  And HGBa1c pre visit . Marland Kitchen  Avoid simple sugars  Sweet tea etc.   Healthy weight loss can help blood pressure and diabetes .  And overall health .

## 2013-09-15 ENCOUNTER — Telehealth: Payer: Self-pay | Admitting: Internal Medicine

## 2013-09-15 MED ORDER — AMLODIPINE BESYLATE 10 MG PO TABS: 10.0000 mg | ORAL_TABLET | Freq: Every day | ORAL | Status: DC

## 2013-09-15 NOTE — Telephone Encounter (Signed)
Relevant patient education assigned to patient using Emmi. ° °

## 2013-09-15 NOTE — Telephone Encounter (Signed)
Pt calling back w/ fax number:  [facility]; pharm:  [phone] amLODipine (NORVASC) 5 MG tablet  (Pt states he looked back and found the past RX was not 5mg ,  But 10mg )

## 2013-09-15 NOTE — Telephone Encounter (Signed)
Pt needs amlodipine 10 mg instead of 5 mg. Pt was given wrong rx. Pt needs new rx amlodipine 10 mg #90. Pt will call back with fax #

## 2013-09-15 NOTE — Telephone Encounter (Signed)
New Rx faxed to the [facility] and got confirmation that it was received.  Pt notified.

## 2013-09-15 NOTE — Telephone Encounter (Signed)
Ok to change rx to 10 mg norvasc 1 po qd 90# refill x 3

## 2013-10-28 ENCOUNTER — Telehealth: Payer: Self-pay | Admitting: Internal Medicine

## 2013-10-28 NOTE — Telephone Encounter (Signed)
Pt would like misty to call him back concerning paperwork he dropped off

## 2013-11-02 ENCOUNTER — Other Ambulatory Visit: Payer: Self-pay | Admitting: Internal Medicine

## 2013-11-04 ENCOUNTER — Encounter: Payer: Self-pay | Admitting: *Deleted

## 2013-11-04 NOTE — Telephone Encounter (Signed)
Sent to the pharmacy by e-scribe. 

## 2013-11-08 ENCOUNTER — Other Ambulatory Visit: Payer: Self-pay | Admitting: Family Medicine

## 2013-11-08 NOTE — Telephone Encounter (Signed)
Patient notified he will have paperwork when he comes in for his next appt.

## 2013-11-09 ENCOUNTER — Other Ambulatory Visit (INDEPENDENT_AMBULATORY_CARE_PROVIDER_SITE_OTHER): Payer: Medicare Other

## 2013-11-09 DIAGNOSIS — E1110 Type 2 diabetes mellitus with ketoacidosis without coma: Secondary | ICD-10-CM

## 2013-11-09 DIAGNOSIS — E1310 Other specified diabetes mellitus with ketoacidosis without coma: Secondary | ICD-10-CM

## 2013-11-10 ENCOUNTER — Telehealth: Payer: Self-pay | Admitting: Internal Medicine

## 2013-11-10 LAB — BASIC METABOLIC PANEL
BUN: 17 mg/dL (ref 6–23)
CHLORIDE: 105 meq/L (ref 96–112)
CO2: 26 meq/L (ref 19–32)
Calcium: 8.9 mg/dL (ref 8.4–10.5)
Creatinine, Ser: 1.8 mg/dL — ABNORMAL HIGH (ref 0.4–1.5)
GFR: 50.07 mL/min — ABNORMAL LOW (ref >60.00)
GLUCOSE: 112 mg/dL — AB (ref 70–99)
POTASSIUM: 3.9 meq/L (ref 3.5–5.1)
SODIUM: 136 meq/L (ref 135–145)

## 2013-11-10 LAB — HEMOGLOBIN A1C: Hgb A1c MFr Bld: 6.7 % — ABNORMAL HIGH (ref 4.6–6.5)

## 2013-11-10 NOTE — Telephone Encounter (Signed)
The only slot leg\ft next week is 4 pm on Wednesday aug 5th if he wants to change that appt.

## 2013-11-10 NOTE — Telephone Encounter (Signed)
Pt has an appt on 8/4 at 3:15, pt needs a 4:00pm appt, because he said he can't make on time leaving from his job in [place]. Ok to use a 4:00 sda slot?

## 2013-11-11 ENCOUNTER — Telehealth: Payer: Self-pay | Admitting: *Deleted

## 2013-11-11 NOTE — Telephone Encounter (Signed)
Pt has been sch for 11-17-13 4pm

## 2013-11-11 NOTE — Telephone Encounter (Signed)
Left message on machine for patient to call/send a copy of his last lab results done at the [place]

## 2013-11-11 NOTE — Telephone Encounter (Signed)
Left msg for pt to call back and reschedule

## 2013-11-16 ENCOUNTER — Ambulatory Visit: Payer: Medicare Other | Admitting: Internal Medicine

## 2013-11-17 ENCOUNTER — Encounter: Payer: Self-pay | Admitting: Internal Medicine

## 2013-11-17 ENCOUNTER — Ambulatory Visit (INDEPENDENT_AMBULATORY_CARE_PROVIDER_SITE_OTHER): Payer: Medicare Other | Admitting: Internal Medicine

## 2013-11-17 VITALS — BP 138/84 | Temp 98.1°F | Ht 71.0 in | Wt 226.0 lb

## 2013-11-17 DIAGNOSIS — N058 Unspecified nephritic syndrome with other morphologic changes: Secondary | ICD-10-CM

## 2013-11-17 DIAGNOSIS — N289 Disorder of kidney and ureter, unspecified: Secondary | ICD-10-CM

## 2013-11-17 DIAGNOSIS — E1129 Type 2 diabetes mellitus with other diabetic kidney complication: Secondary | ICD-10-CM

## 2013-11-17 DIAGNOSIS — E118 Type 2 diabetes mellitus with unspecified complications: Secondary | ICD-10-CM

## 2013-11-17 DIAGNOSIS — I10 Essential (primary) hypertension: Secondary | ICD-10-CM

## 2013-11-17 DIAGNOSIS — E1121 Type 2 diabetes mellitus with diabetic nephropathy: Secondary | ICD-10-CM | POA: Insufficient documentation

## 2013-11-17 NOTE — Patient Instructions (Signed)
Will fax forms in  Because  Kidney function is somewhat down from last time we need to recheck the creatinine in about a month along with lipid  Profile. Make appt  In about a month for labs   [facility] [place] [facility].   FU visit depending on labs and visit.    Or 6 months with labs.

## 2013-11-17 NOTE — Progress Notes (Signed)
Pre visit review using our clinic review tool, if applicable. No additional management support is needed unless otherwise documented below in the visit note.  Chief Complaint  Patient presents with  . Follow-up    HPI: Jeremy Black medicl problems and has forms for the va  Dm diet  And metformin   HT: forms from [place]   as appointment October 18th . PCP and the psychiatrist  ; bp u0p and down even since the emergency room visit Back on  Amlopdipine 5 then 10 mg  .  Seems to be working much better and blood pressure readings are in the [id number] ranges diastolic normal. No specific side effect noted. Still going to the [place] for PTSD counseling he has a form for diabetes in a form for hypertension disability so he can get his medications paid for by the [place].   Has had an eye check by eye doctor and no glaucoma or retinopathy reported ROS: See pertinent positives and negatives per HPI. No current chest pain shortness of breath changes. He has residual numbness in his medial toes that could be related to his back surgery no progression. No falling.  Past Medical History  Diagnosis Date  . Hyperlipidemia   . Hypertension   . ED (erectile dysfunction)   . Proteinuria     past record showed 500mg  protein excretion per 24 hours eval by renal  . PTSD (post-traumatic stress disorder)     from [place] experience [place]  . Normal nuclear stress test     myoview neg 12 09 except for HT  . Other and unspecified alcohol dependence, unspecified drinking behavior     date of last use 02/13/11  . S/P dilatation of esophageal stricture     at the [facility]  . Diabetes mellitus without complication   . Allergy   . Arthritis   . Cataract   . GERD (gastroesophageal reflux disease)     Family History  Problem Relation Age of Onset  . Diabetes Mother   . Hypertension Mother   . Other Mother     nerves  . Throat cancer Father     deceased  . Coronary artery disease Sister   . Diabetes Sister   . Heart  disease Sister   . Hypertension Brother   . Alcohol abuse Paternal Aunt   . Alcohol abuse Cousin   . Drug abuse Son     cocaine dependent    History   Social History  . Marital Status: Legally Separated    Spouse Name: N/A    Number of Children: N/A  . Years of Education: N/A   Social History Main Topics  . Smoking status: Former Research scientist (life sciences)  . Smokeless tobacco: None  . Alcohol Use: 30.0 oz/week    50 Shots of liquor per week     Comment: Pt in treatment for alcohol dependence. Last date of use 02/13/11.,  Very very little 01/27/13  . Drug Use: No  . Sexual Activity: None   Other Topics Concern  . 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   Working with disabled adults.    Plays golf weekly              Outpatient Encounter Prescriptions as of 11/17/2013  Medication Sig  . amLODipine (NORVASC) 10 MG tablet Take 1 tablet (10 mg total) by mouth daily.  Marland Kitchen aspirin 81 MG chewable tablet Chew 81 mg by mouth daily.   . cetirizine (ZYRTEC) 10 MG tablet Take 10 mg by mouth 2 (two) times daily.   Marland Kitchen MAGNESIUM PO Take 1 tablet by mouth daily as needed (cramp).  . metFORMIN (GLUCOPHAGE-XR) 500 MG 24 hr tablet Take 1 tablet (500 mg total) by mouth daily with breakfast.  . Multiple Vitamins-Minerals (CENTRUM SILVER PO) Take 1 tablet by mouth daily.   Marland Kitchen omeprazole (PRILOSEC) 20 MG capsule Take 20 mg by mouth daily. From the [place]  . Potassium (POTASSIMIN PO) Take 1 tablet by mouth daily as needed (cramps).  . sildenafil (VIAGRA) 100 MG tablet Take 50 mg by mouth as needed for erectile dysfunction.  . tadalafil (CIALIS) 20 MG tablet Take 1 tablet (20 mg total) by mouth daily  as needed.  Marland Kitchen telmisartan (MICARDIS) 80 MG tablet Take 1 tablet by mouth 2  times daily.  . traZODone (DESYREL) 50 MG tablet Take 50 mg by mouth at bedtime as needed for sleep.  Marland Kitchen triamterene-hydrochlorothiazide (MAXZIDE-25) 37.5-25 MG per tablet Take 1 tablet by mouth  daily    EXAM:  BP 138/84  Temp(Src) 98.1 F (36.7 C) (Oral)  Ht 5\' 11"  (1.803 m)  Wt 226 lb (102.513 kg)  BMI 31.53 kg/m2  Body mass index is 31.53 kg/(m^2).  GENERAL: vitals reviewed and listed above, alert, oriented, appears well hydrated and in no acute distress HEENT: atraumatic, conjunctiva  clear, no obvious abnormalities on inspection of external nose and ears OP : no lesion edema or exudate  NECK: no obvious masses on inspection palpation  LUNGS: clear to auscultation bilaterally, no wheezes, rales or rhonchi, good air movement CV: HRRR, no clubbing cyanosis or  peripheral edema nl cap refill  MS: moves all extremities without noticeable focal  abnormality PSYCH: pleasant and cooperative, no obvious depression or anxiety Lab Results  Component Value Date   WBC 6.1 09/12/2013   HGB 13.8 09/12/2013   HCT 42.0 09/12/2013   PLT 364 09/12/2013   GLUCOSE 112* 11/09/2013   CHOL 135 06/30/2012   TRIG 197.0* 06/30/2012   HDL 24.80* 06/30/2012   LDLDIRECT 66.7 01/31/2011   LDLCALC 71 06/30/2012   ALT 20 06/30/2012   AST 21 06/30/2012   NA 136 11/09/2013   K 3.9 11/09/2013   CL 105 11/09/2013   CREATININE 1.8* 11/09/2013   BUN 17 11/09/2013   CO2 26 11/09/2013   TSH 0.76 07/09/2012   PSA 0.82 09/07/2009   HGBA1C 6.7* 11/09/2013   MICROALBUR 141.5* 09/14/2009   BP Readings from Last 3 Encounters:  11/17/13 138/84  09/14/13 148/94  09/12/13 193/94   Wt Readings from Last 3 Encounters:  11/17/13 226 lb (102.513 kg)  09/14/13 225 lb (102.059 kg)  02/23/13 227 lb (102.967 kg)     ASSESSMENT AND PLAN:  Discussed the following assessment and plan:  Unspecified essential hypertension  Renal insufficiency -  Creatinine rose to 1.8 recently recheck in a month not taking in a toxic medicines.  Type 2 diabetes mellitus with complication  Type 2 diabetes mellitus with diabetic nephropathy Blood pressure is much better continue same medications he will be getting this  at the [place] out forms with patient today also reviewed medical history. His creatinine is elevated to 1.8 this last time unknown cause his blood pressures in better control. Recheck BMP in a month with a lipid panel as he is due for this. We'll have to decide if he should remain on metformin orifice too much of a risk because of renal insufficiency. Have him get information of any [facility] testing evaluations he does to be sent to [place]. -Patient advised to return or notify health care team  if symptoms worsen ,persist or new concerns arise.  Patient Instructions  Will fax forms in  Because  Kidney function is somewhat down from last time we need to recheck the creatinine in about a month along with lipid  Profile. Make appt  In about a month for labs   Have VA send any pertinent labs or evaluations to [place] for our records.   FU visit depending on labs and visit.    Or 6 months with labs.     Standley Brooking. Kaliq Lege M.D. Total visit 89mins > 50% spent counseling and coordinating care

## 2013-12-14 ENCOUNTER — Other Ambulatory Visit (INDEPENDENT_AMBULATORY_CARE_PROVIDER_SITE_OTHER): Payer: Medicare Other

## 2013-12-14 DIAGNOSIS — E785 Hyperlipidemia, unspecified: Secondary | ICD-10-CM

## 2013-12-14 DIAGNOSIS — I10 Essential (primary) hypertension: Secondary | ICD-10-CM

## 2013-12-14 LAB — BASIC METABOLIC PANEL
BUN: 15 mg/dL (ref 6–23)
CALCIUM: 8.9 mg/dL (ref 8.4–10.5)
CO2: 27 mEq/L (ref 19–32)
CREATININE: 1.7 mg/dL — AB (ref 0.4–1.5)
Chloride: 103 mEq/L (ref 96–112)
GFR: 53.2 mL/min — ABNORMAL LOW (ref >60.00)
Glucose, Bld: 112 mg/dL — ABNORMAL HIGH (ref 70–99)
Potassium: 4.3 mEq/L (ref 3.5–5.1)
Sodium: 137 mEq/L (ref 135–145)

## 2013-12-14 LAB — LIPID PANEL
CHOL/HDL RATIO: 6
Cholesterol: 190 mg/dL (ref 0–200)
HDL: 29.3 mg/dL — AB (ref >39.00)
LDL Cholesterol: 130 mg/dL — ABNORMAL HIGH (ref 0–99)
NONHDL: 160.7
Triglycerides: 155 mg/dL — ABNORMAL HIGH (ref 0.0–149.0)
VLDL: 31 mg/dL (ref 0.0–40.0)

## 2013-12-15 ENCOUNTER — Other Ambulatory Visit: Payer: Medicare Other

## 2013-12-28 ENCOUNTER — Other Ambulatory Visit: Payer: Self-pay | Admitting: Internal Medicine

## 2014-01-12 ENCOUNTER — Telehealth: Payer: Self-pay | Admitting: Family Medicine

## 2014-01-12 NOTE — Telephone Encounter (Signed)
i advise go back on atorvastatin since didn't have side effects noted in record  20 mg per day and  Plan Recheck lipid panel and BMP and hgba1c   in 3 months  Then ROV

## 2014-01-12 NOTE — Telephone Encounter (Signed)
Spoke to the pt and informed him of lab work.  Asked about atorvastatin and he said someone had taken him off that medication.  He could not remember who or why.  He denied any side effects.  I told him I would call him back tomorrow.  He informed me that he will be at work and to leave a detailed message on him machine and he will get back with me if necessary.

## 2014-01-13 ENCOUNTER — Other Ambulatory Visit: Payer: Self-pay | Admitting: Family Medicine

## 2014-01-13 ENCOUNTER — Telehealth: Payer: Self-pay | Admitting: Internal Medicine

## 2014-01-13 DIAGNOSIS — I10 Essential (primary) hypertension: Secondary | ICD-10-CM

## 2014-01-13 DIAGNOSIS — E785 Hyperlipidemia, unspecified: Secondary | ICD-10-CM

## 2014-01-13 DIAGNOSIS — E1140 Type 2 diabetes mellitus with diabetic neuropathy, unspecified: Secondary | ICD-10-CM

## 2014-01-13 MED ORDER — ATORVASTATIN CALCIUM 20 MG PO TABS: 20.0000 mg | ORAL_TABLET | Freq: Every day | ORAL | Status: DC

## 2014-01-13 NOTE — Telephone Encounter (Signed)
Called and left the patient a detailed message.  Informed him to restart atorvastatin and that I will send it to the local pharmacy for now.  Can send to [facility] in the future.  Orders for lab work have been placed in the system.  Will send a reminder by mail to make follow up and lab appt in 3 months.

## 2014-01-13 NOTE — Telephone Encounter (Signed)
Pt needs new rx atorvastatin 20 mg #90 with refills fax to [place] [phone]

## 2014-01-19 MED ORDER — ATORVASTATIN CALCIUM 20 MG PO TABS: 20.0000 mg | ORAL_TABLET | Freq: Every day | ORAL | Status: DC

## 2014-01-19 NOTE — Telephone Encounter (Signed)
Faxed to the pharmacy.  Received transmission log stating the fax was received.

## 2014-01-21 ENCOUNTER — Other Ambulatory Visit: Payer: Self-pay | Admitting: Cardiovascular Disease

## 2014-01-26 ENCOUNTER — Telehealth: Payer: Self-pay

## 2014-01-26 NOTE — Telephone Encounter (Signed)
Had eye exam 3-4 mths ago-unsure of doctor's name or name of office--stated he would call back with information.

## 2014-02-22 ENCOUNTER — Encounter: Payer: Self-pay | Admitting: Internal Medicine

## 2014-02-22 ENCOUNTER — Telehealth: Payer: Self-pay | Admitting: Internal Medicine

## 2014-02-22 ENCOUNTER — Other Ambulatory Visit: Payer: Self-pay | Admitting: Family Medicine

## 2014-02-22 ENCOUNTER — Ambulatory Visit (INDEPENDENT_AMBULATORY_CARE_PROVIDER_SITE_OTHER): Payer: Medicare Other | Admitting: Internal Medicine

## 2014-02-22 VITALS — BP 146/78 | Temp 98.3°F | Ht 71.0 in | Wt 227.4 lb

## 2014-02-22 DIAGNOSIS — R05 Cough: Secondary | ICD-10-CM

## 2014-02-22 DIAGNOSIS — I10 Essential (primary) hypertension: Secondary | ICD-10-CM

## 2014-02-22 DIAGNOSIS — R059 Cough, unspecified: Secondary | ICD-10-CM

## 2014-02-22 NOTE — Telephone Encounter (Signed)
Patient Information:  Caller Name: Zabien  Phone: [phone]  Patient: Jeremy Black, Jeremy Black  Gender: Male  DOB: December 18, 1945  Age: 68 Years  PCP: Shanon Ace (Family Practice)  Office Follow Up:  Does the office need to follow up with this patient?: No  Instructions For The Office: N/A  RN Note:  Per disposition contacted the office and spoke with Derinda Late, pt scheduled for 1600 with Dr Regis Bill.  Symptoms  Reason For Call & Symptoms: Pt reports blood pressure Right arm 180/130 and left arm 155/110,  Reviewed Health History In EMR: Yes  Reviewed Medications In EMR: Yes  Reviewed Allergies In EMR: Yes  Reviewed Surgeries / Procedures: Yes  Date of Onset of Symptoms: 02/19/2014  Guideline(s) Used:  High Blood Pressure  Disposition Per Guideline:   Go to ED Now (or to Office with PCP Approval)  Reason For Disposition Reached:   BP > 160 / 100 and any cardiac or neurologic symptoms (e.g., chest pain, difficulty breathing, unsteady gait, blurred vision)  Advice Given:  Call Back If:  Headache, blurred vision, difficulty talking, or difficulty walking occurs  Chest pain or difficulty breathing occurs  You want to go in to the office for a blood pressure check  You become worse.  Patient Will Follow Care Advice:  YES  Appointment Scheduled:  02/22/2014 16:00:00 Appointment Scheduled Provider:  Shanon Ace [facility])

## 2014-02-22 NOTE — Progress Notes (Signed)
Pre visit review using our clinic review tool, if applicable. No additional management support is needed unless otherwise documented below in the visit note.  Chief Complaint  Patient presents with  . Hypertension    HPI: Jeremy Black 68 y.o. comes in for SDA  Acute appt for BP concerns    bp at work was elevated at work wrist and was in the 199/130 and same  range .  160 / Physician  at [place] medication   To lisinopril To see if causing problem   Changed   Developed Cough with ace and stayed off and then restart taking med and was taking it at work . Wrist machine and very high  140- 150 /90 at home .  On reg med for a week. Flu shot ? And statin Cough and clear drainage  Still off ace for 2 weeks  To get eval by va for memory issues also  Now has 90 % disability.  Still able to work 30 hours per week. Also\  getting low dose ct for ? Lung cancer screening  ROS: See pertinent positives and negatives per HPI. No cp sob  Right groin area pops out whtn walking   Past Medical History  Diagnosis Date  . Hyperlipidemia   . Hypertension   . ED (erectile dysfunction)   . Proteinuria     past record showed 500mg  protein excretion per 24 hours eval by renal  . PTSD (post-traumatic stress disorder)     from [place] experience [place]  . Normal nuclear stress test     myoview neg 12 09 except for HT  . Other and unspecified alcohol dependence, unspecified drinking behavior     date of last use 02/13/11  . S/P dilatation of [facility] stricture     [facility]  . Diabetes mellitus without complication   . Allergy   . Arthritis   . Cataract   . GERD (gastroesophageal reflux disease)     Family History  Problem Relation Age of Onset  . Diabetes Mother   . Hypertension Mother   . Other Mother     nerves  . Throat cancer Father     deceased  . Coronary artery disease Sister   . Diabetes Sister   . Heart disease Sister   . Hypertension Brother   . Alcohol abuse Paternal Aunt    . Alcohol abuse Cousin   . Drug abuse Son     cocaine dependent    History   Social History  . Marital Status: Legally Separated    Spouse Name: N/A    Number of Children: N/A  . Years of Education: N/A   Social History Main Topics  . Smoking status: Former Research scientist (life sciences)  . Smokeless tobacco: None  . Alcohol Use: 30.0 oz/week    50 Shots of liquor per week     Comment: Pt in treatment for alcohol dependence. Last date of use 02/13/11.,  Very very little 01/27/13  . Drug Use: No  . Sexual Activity: None   Other Topics Concern  . 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   Working with disabled adults.    Plays golf weekly              Outpatient Encounter Prescriptions as of 02/22/2014  Medication Sig  . amLODipine (NORVASC) 10 MG tablet Take 1 tablet (10 mg total) by mouth daily.  Marland Kitchen aspirin 81 MG chewable tablet Chew 81 mg by mouth daily.   Marland Kitchen atorvastatin (LIPITOR) 20 MG tablet Take 1 tablet (20 mg total) by mouth daily.  . cetirizine (ZYRTEC) 10 MG tablet Take 10 mg by mouth 2 (two) times daily.   Marland Kitchen MAGNESIUM PO Take 1 tablet by mouth daily as needed (cramp).  . metFORMIN (GLUCOPHAGE-XR) 500 MG 24 hr tablet Take 1 tablet (500 mg total) by mouth daily with breakfast.  . Multiple Vitamins-Minerals (CENTRUM SILVER PO) Take 1 tablet by mouth daily.   Marland Kitchen omeprazole (PRILOSEC) 20 MG capsule Take 20 mg by mouth daily. From the [place]  . Potassium (POTASSIMIN PO) Take 1 tablet by mouth daily as needed (cramps).  . sildenafil (VIAGRA) 100 MG tablet Take 50 mg by mouth as needed for erectile dysfunction.  . tadalafil (CIALIS) 20 MG tablet Take 1 tablet (20 mg total) by  mouth daily as needed.  Marland Kitchen telmisartan (MICARDIS) 80 MG tablet Take 1 tablet by mouth two  times daily  . traZODone (DESYREL) 50 MG tablet Take 50 mg by mouth at bedtime as needed for sleep.  Marland Kitchen triamterene-hydrochlorothiazide (MAXZIDE-25) 37.5-25 MG per tablet Take 1 tablet by mouth  daily  . [DISCONTINUED] lisinopril (PRINIVIL,ZESTRIL) 40 MG tablet Take 40 mg by mouth daily.    EXAM:  BP 146/78 mmHg  Temp(Src) 98.3 F (36.8 C) (Oral)  Ht 5\' 11"  (1.803 m)  Wt 227 lb 6.4 oz (103.148 kg)  BMI 31.73 kg/m2  Body mass index is 31.73 kg/(m^2).  GENERAL: vitals reviewed and listed above, alert, oriented, appears well hydrated and in no acute distress HEENT: atraumatic, conjunctiva  clear, no obvious abnormalities on inspection of external nose and ears  CV: HRRR, no clubbing cyanosis o nl cap refill  MS: moves all extremities without noticeable focal  abnormality PSYCH: pleasant and cooperative Lab Results  Component Value Date   WBC 6.1 09/12/2013   HGB 13.8 09/12/2013   HCT 42.0 09/12/2013   PLT 364 09/12/2013   GLUCOSE 112* 12/14/2013   CHOL 190 12/14/2013   TRIG 155.0* 12/14/2013   HDL 29.30* 12/14/2013   LDLDIRECT 66.7 01/31/2011   LDLCALC 130* 12/14/2013   ALT 20 06/30/2012   AST 21 06/30/2012   NA 137 12/14/2013   K 4.3 12/14/2013   CL 103 12/14/2013   CREATININE 1.7* 12/14/2013   BUN 15 12/14/2013   CO2 27 12/14/2013   TSH 0.76 07/09/2012   PSA 0.82 09/07/2009   HGBA1C 6.7* 11/09/2013   MICROALBUR 141.5* 09/14/2009   BP Readings from Last 3 Encounters:  02/22/14 146/78  11/17/13 138/84  09/14/13 148/94   Wt Readings from Last 3 Encounters:  02/22/14 227 lb 6.4 oz (103.148 kg)  11/17/13 226 lb (102.513 kg)  09/14/13 225 lb (102.059 kg)     ASSESSMENT AND PLAN:  Discussed the following assessment and plan:  Essential hypertension - cahnging meds per va  up but not alarming they shoudl fu and get better monitor  Cough - presumed  from ace avoid cough  drops for airway irritability  syndrome At this time I assumed the [place] doctor is can working with  the blood pressure control and side effects. Did give advice. Discussed other information. -Patient advised to return or notify health care team  if symptoms worsen ,persist or new concerns arise.  Patient Instructions  Your repeat blood pressure is better . Get a better  machoine  And rest for 5 mintues before taking pressure  Send readings .    To your [place] doctor if  they are managing the medication.  Please have the [place]  (Or you send a copy) of evaluations that you are receiving about the  Memory and such .        Standley Brooking. Lilya Smitherman M.D. Total visit 35mins > 50% spent counseling and coordinating care

## 2014-02-22 NOTE — Patient Instructions (Signed)
Your repeat blood pressure is better . Get a better  machoine  And rest for 5 mintues before taking pressure  Send readings .    To your [place] doctor if  they are managing the medication.  Please have the [place]  (Or you send a copy) of evaluations that you are receiving about the  Memory and such .

## 2014-02-23 ENCOUNTER — Telehealth: Payer: Self-pay | Admitting: Internal Medicine

## 2014-02-23 DIAGNOSIS — R059 Cough, unspecified: Secondary | ICD-10-CM

## 2014-02-23 DIAGNOSIS — R05 Cough: Secondary | ICD-10-CM

## 2014-02-23 NOTE — Telephone Encounter (Signed)
Pt would to know if md will refer him to allergist for his cough.

## 2014-02-24 NOTE — Telephone Encounter (Signed)
Can do a referral but suggest seeing pulmonary also   Dr Annamaria Boots  Is an allergist and pulmonary  in our practice . Please do referral to him thanks

## 2014-02-25 NOTE — Telephone Encounter (Signed)
LM for the pt to return my call. 

## 2014-02-28 ENCOUNTER — Encounter: Payer: Medicare Other | Admitting: Podiatry

## 2014-02-28 NOTE — Progress Notes (Signed)
Subjective:     Patient ID: Jeremy Black, male   DOB: [birth year], 68 y.o.   MRN: [id number]  Foot Pain   patient presents stating he was just diagnosed with diabetes and he wanted to speak checked and want to know how to cut his toenails properly. Also was concerned about mild pain on the outside of his feet   Review of Systems  All other systems reviewed and are negative.       Objective:   Physical Exam  Nursing note and vitals reviewed. Constitutional: He appears well-developed and well-nourished.  Cardiovascular: Intact distal pulses.   Musculoskeletal: Normal range of motion.  Neurological: He is alert.   Patient has normal muscle and no other issues noted   his nails do have some thickness to dampen the left hallux nail is lifted. Diabetes under very good control Assessment:     New onset diabetic with minimal foot symptoms and mycotic nail infection left hallux    Plan:     H&P performed and diabetic education render. Debridement nailbeds was accomplished and instructions on doing this was given formula 3 was dispensed for nail infection

## 2014-02-28 NOTE — Telephone Encounter (Signed)
Spoke to the pt. Informed his of referral to see Dr. Annamaria Boots.  Pt is excited to see pulmonary and allergist in one.  Order placed in the system.

## 2014-03-03 ENCOUNTER — Ambulatory Visit (INDEPENDENT_AMBULATORY_CARE_PROVIDER_SITE_OTHER): Payer: Medicare Other | Admitting: Podiatry

## 2014-03-03 ENCOUNTER — Ambulatory Visit (INDEPENDENT_AMBULATORY_CARE_PROVIDER_SITE_OTHER): Payer: Medicare Other | Admitting: Internal Medicine

## 2014-03-03 ENCOUNTER — Encounter: Payer: Self-pay | Admitting: Internal Medicine

## 2014-03-03 ENCOUNTER — Encounter: Payer: Self-pay | Admitting: Podiatry

## 2014-03-03 ENCOUNTER — Ambulatory Visit (INDEPENDENT_AMBULATORY_CARE_PROVIDER_SITE_OTHER)
Admission: RE | Admit: 2014-03-03 | Discharge: 2014-03-03 | Disposition: A | Discharge status: Home / Self Care | Payer: Medicare Other | Source: Ambulatory Visit | Attending: Internal Medicine | Admitting: Internal Medicine

## 2014-03-03 VITALS — BP 130/74 | HR 75 | Ht 71.0 in | Wt 229.0 lb

## 2014-03-03 DIAGNOSIS — R059 Cough, unspecified: Secondary | ICD-10-CM | POA: Insufficient documentation

## 2014-03-03 DIAGNOSIS — B351 Tinea unguium: Secondary | ICD-10-CM

## 2014-03-03 DIAGNOSIS — R05 Cough: Secondary | ICD-10-CM

## 2014-03-03 DIAGNOSIS — M79673 Pain in unspecified foot: Secondary | ICD-10-CM

## 2014-03-03 MED ORDER — BENZONATATE 200 MG PO CAPS: ORAL_CAPSULE | ORAL | Status: DC

## 2014-03-03 MED ORDER — METHYLPREDNISOLONE ACETATE 80 MG/ML IJ SUSP
120.0000 mg | Freq: Once | INTRAMUSCULAR | Status: AC
  Administered 2014-03-03: 120 mg via INTRAMUSCULAR

## 2014-03-03 NOTE — Progress Notes (Signed)
   Subjective:    Patient ID: Jeremy Black, male    DOB: 1945-11-01,    MRN: [id number]  HPI  23 yowm quit smoking 2011 s resp problems but subsequently noted mild seasonal rhinitis spring > fall controlled with zyrtec   then bad cough p one week on lisinopril and then subsided but did not resolve completely p stopped lisinopril 11/101/5>  referred by Dr Regis Bill to pulmonary clinic 03/03/2014    03/03/2014 1st [facility] office visit/ [facility]   Chief Complaint  Patient presents with  . Pulmonary Consult    Referred by Dr. Regis Bill. Pt c/o cough x 3 wks. Cough is non prod. He was just taken off of lisinopril 3 wks ago and feels the cough has started to improve some.    cough was dry /hacking acutely p started lisinopril through the [place] mid oct 2015 esp with singing first noted but then all day > night assoc with hoarseness and mild sense dysphagia  > no noct symptoms or sob  No obvious other patterns in day to day or daytime variabilty or assoc  cp or chest tightness, subjective wheeze overt   hb symptoms. No unusual exp hx or h/o childhood pna/ asthma or knowledge of premature birth.  Sleeping ok without nocturnal  or early am exacerbation  of respiratory  c/o's or need for noct saba. Also denies any obvious fluctuation of symptoms with weather or environmental changes or other aggravating or alleviating factors except as outlined above   Current Medications, Allergies, Complete Past Medical History, Past Surgical History, Family History, and Social History were reviewed in [employer] record.            Review of Systems  Constitutional: Negative for fever, chills, activity change, appetite change and unexpected weight change.  HENT: Positive for congestion and sneezing. Negative for dental problem, postnasal drip, rhinorrhea, sore throat, trouble swallowing and voice change.   Eyes: Negative for visual disturbance.  Respiratory: Positive for cough. Negative for  choking and shortness of breath.   Cardiovascular: Negative for chest pain and leg swelling.  Gastrointestinal: Negative for nausea, vomiting and abdominal pain.  Genitourinary: Negative for difficulty urinating.  Musculoskeletal: Negative for arthralgias.  Skin: Negative for rash.  Psychiatric/Behavioral: Negative for behavioral problems and confusion.       Objective:   Physical Exam  amb bm nad  Wt Readings from Last 3 Encounters:  03/03/14 229 lb (103.874 kg)  02/22/14 227 lb 6.4 oz (103.148 kg)  11/17/13 226 lb (102.513 kg)    Vital signs reviewed   HEENT: full dentures/ nl  turbinates, and orophanx. Nl external ear canals without cough reflex   NECK :  without JVD/Nodes/TM/ nl carotid upstrokes bilaterally   LUNGS: no acc muscle use, clear to A and P bilaterally without cough on insp or exp maneuvers   CV:  RRR  no s3 or murmur or increase in P2, no edema   ABD:  soft and nontender with nl excursion in the supine position. No bruits or organomegaly, bowel sounds nl  MS:  warm without deformities, calf tenderness, cyanosis or clubbing  SKIN: warm and dry without lesions    NEURO:  alert, approp, no deficits   CXR  03/03/2014 :    No active cardiopulmonary disease.      Assessment & Plan:

## 2014-03-03 NOTE — Patient Instructions (Addendum)
Prilosec 20 mg x 2  30-60 min before frst meal and pepcid 20 mg until cough is gone  GERD (REFLUX)  is an extremely common cause of respiratory symptoms just like yours , many times with no obvious heartburn at all.    It can be treated with medication, but also with lifestyle changes including avoidance of late meals, excessive alcohol, smoking cessation, and avoid fatty foods, chocolate, peppermint, colas, red wine, and acidic juices such as orange juice.  NO MINT OR MENTHOL PRODUCTS SO NO COUGH DROPS  USE SUGARLESS CANDY INSTEAD (Jolley ranchers or Stover's or Life Savers) or even ice chips will also do - the key is to swallow to prevent all throat clearing. NO OIL BASED VITAMINS - use powdered substitutes.  If still coughing > tessilon pearls 200 mg every 4 hours as needed   Change the zyrtec to where you just take it for itching or sneezing during the seasons that bother you   If cough persists after middle December need to return   Please remember to go to the lab  department downstairs for your tests - we will call you with the results when they are available.

## 2014-03-04 ENCOUNTER — Telehealth: Payer: Self-pay | Admitting: Internal Medicine

## 2014-03-04 NOTE — Assessment & Plan Note (Signed)
The most common causes of chronic cough in immunocompetent adults include the following: upper airway cough syndrome (UACS), previously referred to as postnasal drip syndrome (PNDS), which is caused by variety of rhinosinus conditions; (2) asthma; (3) GERD; (4) chronic bronchitis from cigarette smoking or other inhaled environmental irritants; (5) nonasthmatic eosinophilic bronchitis; and (6) bronchiectasis.   These conditions, singly or in combination, have accounted for up to 94% of the causes of chronic cough in prospective studies.   Other conditions have constituted no >6% of the causes in prospective studies These have included bronchogenic carcinoma, chronic interstitial pneumonia, sarcoidosis, left ventricular failure, ACEI-induced cough, and aspiration from a condition associated with pharyngeal dysfunction.    Chronic cough is often simultaneously caused by more than one condition. A single cause has been found from 38 to 82% of the time, multiple causes from 18 to 62%. Multiply caused cough has been the result of three diseases up to 42% of the time.       Based on hx and exam, this is most likely:  Classic Upper airway cough syndrome, so named because it's frequently impossible to sort out how much is  CR/sinusitis with freq throat clearing (which can be related to primary GERD)   vs  causing  secondary (" extra esophageal")  GERD from wide swings in gastric pressure that occur with throat clearing, often  promoting self use of mint and menthol lozenges that reduce the lower esophageal sphincter tone and exacerbate the problem further in a cyclical fashion.   These are the same pts (now being labeled as having "irritable larynx syndrome" by some cough centers) who not infrequently have a history of having failed to tolerate ace inhibitors (clearly the case here) ,  dry powder inhalers or biphosphonates or report having atypical reflux symptoms (probably the case here)at don't respond to  standard doses of PPI , and are easily confused as having aecopd or asthma flares by even experienced allergists/ pulmonologists.   The first step is to maximize acid suppression and eliminate cyclical coughing then regroup if the cough persists.  See instructions for specific recommendations which were reviewed directly with the patient who was given a copy with highlighter outlining the key components.

## 2014-03-04 NOTE — Telephone Encounter (Signed)
Patient notified of CXR results. Nothing further needed.  

## 2014-03-04 NOTE — Progress Notes (Signed)
Quick Note:  LMTCB ______ 

## 2014-03-05 NOTE — Progress Notes (Signed)
Subjective:     Patient ID: Jeremy Black, male   DOB: [birth year], 68 y.o.   MRN: [id number]  HPI patient presents with elongated nailbeds 1-5 both feet that are thick and he cannot cut with history of diabetes   Review of Systems     Objective:   Physical Exam Neurovascular status unchanged with thick yellow brittle nailbeds 1-5 both feet    Assessment:     Mycotic nail infections with pain 1-5 both feet    Plan:     Debride painful nailbeds 1-5 both feet with no iatrogenic bleeding noted

## 2014-03-07 ENCOUNTER — Ambulatory Visit: Payer: Medicare Other

## 2014-04-18 ENCOUNTER — Other Ambulatory Visit: Payer: Self-pay | Admitting: Internal Medicine

## 2014-04-19 ENCOUNTER — Telehealth: Payer: Self-pay | Admitting: Family Medicine

## 2014-04-19 NOTE — Telephone Encounter (Signed)
Sent to the pharmacy by e-scribe. 

## 2014-04-19 NOTE — Telephone Encounter (Signed)
Pt has been sch

## 2014-04-19 NOTE — Telephone Encounter (Signed)
Pt is due for lab work.  Orders in the system.  Please schedule the pt.

## 2014-04-20 ENCOUNTER — Other Ambulatory Visit (INDEPENDENT_AMBULATORY_CARE_PROVIDER_SITE_OTHER): Payer: Medicare Other

## 2014-04-20 ENCOUNTER — Encounter: Payer: Self-pay | Admitting: Internal Medicine

## 2014-04-20 ENCOUNTER — Ambulatory Visit (INDEPENDENT_AMBULATORY_CARE_PROVIDER_SITE_OTHER): Payer: Medicare Other | Admitting: Internal Medicine

## 2014-04-20 VITALS — BP 124/76 | Temp 98.3°F | Ht 71.0 in | Wt 221.7 lb

## 2014-04-20 DIAGNOSIS — I10 Essential (primary) hypertension: Secondary | ICD-10-CM

## 2014-04-20 DIAGNOSIS — E1140 Type 2 diabetes mellitus with diabetic neuropathy, unspecified: Secondary | ICD-10-CM

## 2014-04-20 DIAGNOSIS — R1031 Right lower quadrant pain: Secondary | ICD-10-CM

## 2014-04-20 DIAGNOSIS — M25559 Pain in unspecified hip: Secondary | ICD-10-CM

## 2014-04-20 DIAGNOSIS — Z23 Encounter for immunization: Secondary | ICD-10-CM

## 2014-04-20 DIAGNOSIS — E785 Hyperlipidemia, unspecified: Secondary | ICD-10-CM

## 2014-04-20 LAB — LIPID PANEL
CHOL/HDL RATIO: 3
Cholesterol: 147 mg/dL (ref 0–200)
HDL: 42.2 mg/dL (ref >39.00)
LDL Cholesterol: 79 mg/dL (ref 0–99)
NONHDL: 104.8
Triglycerides: 129 mg/dL (ref 0.0–149.0)
VLDL: 25.8 mg/dL (ref 0.0–40.0)

## 2014-04-20 LAB — BASIC METABOLIC PANEL
BUN: 14 mg/dL (ref 6–23)
CHLORIDE: 106 meq/L (ref 96–112)
CO2: 24 mEq/L (ref 19–32)
Calcium: 9.2 mg/dL (ref 8.4–10.5)
Creatinine, Ser: 1.7 mg/dL — ABNORMAL HIGH (ref 0.4–1.5)
GFR: 53.52 mL/min — ABNORMAL LOW (ref >60.00)
Glucose, Bld: 88 mg/dL (ref 70–99)
POTASSIUM: 4.1 meq/L (ref 3.5–5.1)
Sodium: 138 mEq/L (ref 135–145)

## 2014-04-20 LAB — HEMOGLOBIN A1C: Hgb A1c MFr Bld: 6.7 % — ABNORMAL HIGH (ref 4.6–6.5)

## 2014-04-20 NOTE — Progress Notes (Signed)
Pre visit review using our clinic review tool, if applicable. No additional management support is needed unless otherwise documented below in the visit note.  Chief Complaint  Patient presents with  . Follow-up    HPI: Jeremy Black 69 y.o.  With ht type 2 dm ptsd rx also at the [place] who comes in with right groin ..hip pain insidious onset off and on and now most times most day .   When up and walking  And sometimes feels like will give out . No go sx  Dm doing well as much as  Bp.  Losing weigh t. No recenet golf or injury.  ROS: See pertinent positives and negatives per HPI.  Past Medical History  Diagnosis Date  . Hyperlipidemia   . Hypertension   . ED (erectile dysfunction)   . Proteinuria     past record showed 500mg  protein excretion per 24 hours eval by renal  . PTSD (post-traumatic stress disorder)     from [place] experience [place]  . Normal nuclear stress test     myoview neg 12 09 except for HT  . Other and unspecified alcohol dependence, unspecified drinking behavior     date of last use 02/13/11  . [facility]  . Diabetes mellitus without complication   . Allergy   . Arthritis   . Cataract   . GERD (gastroesophageal reflux disease)     Family History  Problem Relation Age of Onset  . Diabetes Mother   . Hypertension Mother   . Other Mother     nerves  . Throat cancer Father     deceased  . Coronary artery disease Sister   . Diabetes Sister   . Heart disease Sister   . Hypertension Brother   . Alcohol abuse Paternal Aunt   . Alcohol abuse Cousin   . Drug abuse Son     cocaine dependent    History   Social History  . Marital Status: Legally Separated    Spouse Name: N/A    Number of Children: N/A  . 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 Smoking status: Former Smoker -- 0.50 packs/day for 40 years    Types: Cigarettes   Quit date: 04/15/2009  . Smokeless tobacco: Never Used  . Alcohol Use: 30.0 oz/week    50 Shots of liquor per week     Comment: Pt in treatment for alcohol dependence. Last date of use 02/13/11.,  Very very little 01/27/13  . Drug Use: No  . Sexual Activity: None   Other Topics Concern  . 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   Working with disabled adults.    Plays golf weekly              Outpatient Encounter Prescriptions as of 04/20/2014  Medication Sig  . amLODipine (NORVASC) 10 MG tablet Take 1 tablet (10 mg total) by mouth  daily.  . aspirin 81 MG chewable tablet Chew 81 mg by mouth daily.   Marland Kitchen atorvastatin (LIPITOR) 20 MG tablet Take 1 tablet (20 mg total) by mouth daily.  Marland Kitchen atorvastatin (LIPITOR) 20 MG tablet TAKE 1 TABLET BY MOUTH EVERY DAY  . metFORMIN (GLUCOPHAGE-XR) 500 MG 24 hr tablet Take 1 tablet (500 mg total) by mouth daily with breakfast.  . Multiple Vitamins-Minerals (CENTRUM SILVER PO) Take 1 tablet by mouth daily.   Marland Kitchen omeprazole (PRILOSEC) 20 MG capsule Take 20 mg by mouth daily. From the [place]  . Potassium (POTASSIMIN PO) Take 1 tablet by mouth daily as needed (cramps).  . sildenafil (VIAGRA) 100 MG tablet Take 50 mg by mouth as needed for erectile dysfunction.  Marland Kitchen telmisartan (MICARDIS) 80 MG tablet Take 1 tablet by mouth two  times daily  . traZODone (DESYREL) 50 MG tablet Take 50 mg by mouth at bedtime as needed for sleep.  Marland Kitchen triamterene-hydrochlorothiazide (MAXZIDE-25) 37.5-25 MG per tablet Take 1 tablet by mouth  daily  . benzonatate (TESSALON) 200 MG capsule One four times daily as needed for cough (Patient not taking: Reported on  04/20/2014)  . cetirizine (ZYRTEC) 10 MG tablet Take 20 mg by mouth daily.   Marland Kitchen MAGNESIUM PO Take 1 tablet by mouth daily as needed (cramp).  . [DISCONTINUED] tadalafil (CIALIS) 20 MG tablet Take 1 tablet (20 mg total) by mouth daily as needed.    EXAM:  BP 124/76 mmHg  Temp(Src) 98.3 F (36.8 C) (Oral)  Ht 5\' 11"  (1.803 m)  Wt 221 lb 11.2 oz (100.562 kg)  BMI 30.93 kg/m2  Body mass index is 30.93 kg/(m^2).  GENERAL: vitals reviewed and listed above, alert, oriented, appears well hydrated and in no acute distress abd right groin no swelling on palpation. Some tenderness at groin gait is normal today ( not hurting  rom adequate )  No obv weakness . MS: moves all extremities without noticeable focal  abnormality PSYCH: pleasant and cooperative, no obvious depression or anxiety BP Readings from Last 3 Encounters:  04/20/14 124/76  03/03/14 130/74  02/22/14 146/78   Wt Readings from Last 3 Encounters:  04/20/14 221 lb 11.2 oz (100.562 kg)  03/03/14 229 lb (103.874 kg)  02/22/14 227 lb 6.4 oz (103.148 kg)   Lab Results  Component Value Date   WBC 6.1 09/12/2013   HGB 13.8 09/12/2013   HCT 42.0 09/12/2013   PLT 364 09/12/2013   GLUCOSE 112* 12/14/2013   CHOL 190 12/14/2013   TRIG 155.0* 12/14/2013   HDL 29.30* 12/14/2013   LDLDIRECT 66.7 01/31/2011   LDLCALC 130* 12/14/2013   ALT 20 06/30/2012   AST 21 06/30/2012   NA 137 12/14/2013   K 4.3 12/14/2013   CL 103 12/14/2013   CREATININE 1.7* 12/14/2013   BUN 15 12/14/2013   CO2 27 12/14/2013   TSH 0.76 07/09/2012   PSA 0.82 09/07/2009   HGBA1C 6.7* 11/09/2013   MICROALBUR 141.5* 09/14/2009   Wt Readings from Last 3 Encounters:  04/20/14 221 lb 11.2 oz (100.562 kg)  03/03/14 229 lb (103.874 kg)  02/22/14 227 lb 6.4 oz (103.148 kg)    ASSESSMENT AND PLAN:  Discussed the following assessment and plan:  Hip pain, unspecified laterality - Plan: Ambulatory referral to Sports Medicine  Groin pain, right - Plan:  Ambulatory referral to Sports Medicine  Need for tetanus booster - Plan: Td vaccine greater than or equal to 7yo preservative free IM  -Patient advised to return or notify  health care team  if symptoms worsen ,persist or new concerns arise.  Patient Instructions  Consideration  That is  A hip  Problem   Or groin strain  I dont see a hernia at this time. We can arrange   Sports medicine  with Dr Oneida Alar .Marland Kitchen    Standley Brooking. Rowland Ericsson M.D.

## 2014-04-20 NOTE — Patient Instructions (Addendum)
Consideration  That is  A hip  Problem   Or groin strain  I dont see a hernia at this time. We can arrange   Sports medicine  with Dr Oneida Alar .Marland Kitchen

## 2014-05-03 ENCOUNTER — Ambulatory Visit: Payer: Medicare Other | Admitting: Sports Medicine

## 2014-05-04 ENCOUNTER — Ambulatory Visit (INDEPENDENT_AMBULATORY_CARE_PROVIDER_SITE_OTHER): Payer: Medicare Other | Admitting: Sports Medicine

## 2014-05-04 ENCOUNTER — Encounter: Payer: Self-pay | Admitting: Sports Medicine

## 2014-05-04 VITALS — BP 157/82 | Ht 71.0 in | Wt 220.0 lb

## 2014-05-04 DIAGNOSIS — M25551 Pain in right hip: Secondary | ICD-10-CM

## 2014-05-05 ENCOUNTER — Telehealth: Payer: Self-pay | Admitting: Family Medicine

## 2014-05-05 DIAGNOSIS — N289 Disorder of kidney and ureter, unspecified: Secondary | ICD-10-CM

## 2014-05-05 NOTE — Progress Notes (Signed)
   Subjective:    Patient ID: Jeremy Black, male    DOB: 02-26-1946, 69 y.o.   MRN: [id number]  HPI chief complaint: Right hip pain  Very pleasant 69 year old male comes in today complaining of long-standing intermittent right hip pain. He describes a deep-seated "catch" that is happening on a rather frequent basis. It occurs with weightbearing and with walking. The "catch" is rather sudden and is quite uncomfortable but his pain does not last very long. He has a history of CMC osteoarthritis as well as degenerative disc disease. He localizes his pain to the right groin. He has not noticed any loss of motion. He denies lateral hip pain. He denies associated numbness or tingling. No prior hip surgery. No trauma. He takes ibuprofen on an occasional basis. He is referred here today by Dr Regis Bill.  Past medical history reviewed. History significant for diabetes mellitus, the aforementioned osteoarthritis, and renal insufficiency Medications are reviewed Allergies are reviewed    Review of Systems     Objective:   Physical Exam Well-developed, well-nourished. No acute distress. Awake alert and oriented 3. Vital signs reviewed.  Right hip: Smooth painless hip range of motion with a negative logroll but the patient does have reproducible groin pain with full passive internal rotation. No tenderness to palpation. No pain with resisted hip flexion. Negative straight leg raise. Neurovascular intact distally. Walking without significant limp.       Assessment & Plan:  Right hip pain secondary to DJD versus degenerative labral tear  I'm going to start with getting some x-rays of the right hip to evaluate the degree of osteoarthritis present. Phone follow-up after reviewing that x-ray [phone], [phone] (work)). We discussed the possibility of an intra-articular cortisone injection at a later date. In the meantime I have cautioned him about using NSAIDs on a regular basis given his renal  insufficiency. I recommended that he instead use Tylenol as needed.

## 2014-05-05 NOTE — Telephone Encounter (Signed)
Patient viewed his lab results on MyChart.  He is concerned about his kidney function.  Would like to know if [facility] has any thoughts on what could be causing the decline in his function.  Pt notified that [facility] has left for the day and due to inclement weather, the office may not be open on 05/06/14.  Will return his call when information is available.

## 2014-05-08 NOTE — Telephone Encounter (Signed)
Most common is hypertension and also  diabetes   But I would have him go back to the kidney specialist   To recheck   If needed  Dr Moshe Cipro  also make sure not taking any advil aleve type medications that can interfere with kidney  function

## 2014-05-09 ENCOUNTER — Ambulatory Visit
Admission: RE | Admit: 2014-05-09 | Discharge: 2014-05-09 | Disposition: A | Discharge status: Home / Self Care | Payer: Medicare Other | Source: Ambulatory Visit | Attending: Sports Medicine | Admitting: Sports Medicine

## 2014-05-09 DIAGNOSIS — M25551 Pain in right hip: Secondary | ICD-10-CM

## 2014-05-10 NOTE — Telephone Encounter (Signed)
Pt notified of all.  Declined taking Advil, Aleve etc.... Will call Dr. Shelva Majestic office to make an appt.

## 2014-05-11 ENCOUNTER — Telehealth: Payer: Self-pay | Admitting: Sports Medicine

## 2014-05-11 NOTE — Telephone Encounter (Signed)
I spoke with the patient on the phone today after reviewing x-rays of his right hip. He does have some mild degenerative changes in the right hip. Nothing severe. I do think it is the mild osteoarthritis in this hip that is giving him symptoms. His symptoms are not significant enough at this time to warrant any sort of aggressive treatment. We discussed the possibility of an intra-articular cortisone injection down the road if his symptoms warranted. He will follow-up with me when necessary.

## 2014-05-19 NOTE — Telephone Encounter (Signed)
Patient states Dr. Shelva Majestic office told him he needs a referral to see them. Can you please enter the referral.  He would like a call back to know it is being done.

## 2014-05-20 NOTE — Telephone Encounter (Signed)
Order placed in the system. 

## 2014-05-20 NOTE — Telephone Encounter (Signed)
LM on voicemail informing the pt that I have placed the order.

## 2014-05-20 NOTE — Addendum Note (Signed)
Addended by: Miles Costain T on: 05/20/2014 09:37 AM   Modules accepted: Orders

## 2014-06-09 ENCOUNTER — Ambulatory Visit: Payer: Medicare Other

## 2014-06-10 ENCOUNTER — Other Ambulatory Visit: Payer: Self-pay | Admitting: Internal Medicine

## 2014-06-10 NOTE — Telephone Encounter (Signed)
Ok x 9 months

## 2014-06-10 NOTE — Telephone Encounter (Signed)
Sent to the pharmacy by e-scribe. 

## 2014-07-20 ENCOUNTER — Encounter: Payer: Self-pay | Admitting: Internal Medicine

## 2014-07-20 ENCOUNTER — Ambulatory Visit (INDEPENDENT_AMBULATORY_CARE_PROVIDER_SITE_OTHER)
Admission: RE | Admit: 2014-07-20 | Discharge: 2014-07-20 | Disposition: A | Discharge status: Home / Self Care | Payer: Medicare Other | Source: Ambulatory Visit | Attending: Internal Medicine | Admitting: Internal Medicine

## 2014-07-20 ENCOUNTER — Ambulatory Visit (INDEPENDENT_AMBULATORY_CARE_PROVIDER_SITE_OTHER): Payer: Medicare Other | Admitting: Internal Medicine

## 2014-07-20 VITALS — BP 120/80 | Temp 97.6°F | Ht 71.0 in | Wt 212.5 lb

## 2014-07-20 DIAGNOSIS — R809 Proteinuria, unspecified: Secondary | ICD-10-CM

## 2014-07-20 DIAGNOSIS — R1012 Left upper quadrant pain: Secondary | ICD-10-CM

## 2014-07-20 DIAGNOSIS — Z9889 Other specified postprocedural states: Secondary | ICD-10-CM

## 2014-07-20 DIAGNOSIS — I10 Essential (primary) hypertension: Secondary | ICD-10-CM | POA: Diagnosis not present

## 2014-07-20 DIAGNOSIS — R109 Unspecified abdominal pain: Secondary | ICD-10-CM

## 2014-07-20 DIAGNOSIS — N289 Disorder of kidney and ureter, unspecified: Secondary | ICD-10-CM

## 2014-07-20 LAB — POCT URINALYSIS DIP (MANUAL ENTRY)
Bilirubin, UA: NEGATIVE
Blood, UA: NEGATIVE
GLUCOSE UA: NEGATIVE
Ketones, POC UA: NEGATIVE
Leukocytes, UA: NEGATIVE
NITRITE UA: NEGATIVE
Protein Ur, POC: 30
Urobilinogen, UA: 0.2
pH, UA: 6

## 2014-07-20 NOTE — Patient Instructions (Addendum)
Please have  [facility] send [place] copies of all tests done so we are not having to repeat  labs in our  Records .  Glad your blood pressure and a1c is doing great.  Consider kidney stone or  Back nerve pain  Causing  sx.   Getting kidney ct without  Contrast .  To check  rarea Tylenol  For pain in the short run dont take advil aleve .

## 2014-07-20 NOTE — Progress Notes (Signed)
Pre visit review using our clinic review tool, if applicable. No additional management support is needed unless otherwise documented below in the visit note.  Chief Complaint  Patient presents with  . Back Pain    Left Side  . Abdominal Pain  . Leg Pain    HPI: Jeremy Black 69 y.o. with   Ht early dm lipisemia  ptsd ckd  Hx back surgery who recieves care   pcp and also from [place]  comesin today for  New problem eval.  Pain left lower back top and around to groin and top of leg ant thight and left testicle at times .  Onset  4-5 days   Nothing different.  no fever .  No injury has been doing much better with lsi .  Level 10/10 at times no hx of renal stones noted   Has had back surgery no rash . Or shingles had vaccine Was uncertain if  ? If kidney or other so called Dr Real [place] and pending  Contact  Has [facility] pcp at [facility].     Saw Dr Henrene Pastor.   Had diabetic eye check and blood tests.  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 Not working now .  ROS: See pertinent positives and negatives per HPI. No fever cp sob uti sx  But dec with bm and  Urinating some   Past Medical History  Diagnosis Date  . Hyperlipidemia   . Hypertension   . ED (erectile dysfunction)   . Proteinuria     past record showed 500mg  protein excretion per 24 hours eval by renal  . PTSD (post-traumatic stress disorder)     from [place] experience [place]  . Normal nuclear stress test     myoview neg 12 09 except for HT  . Other and unspecified alcohol dependence, unspecified drinking behavior     date of last use 02/13/11  . [facility]  . Diabetes mellitus without complication   . Allergy   . Arthritis   . Cataract   . GERD (gastroesophageal reflux disease)     Family History  Problem Relation Age of Onset  . Diabetes Mother   . Hypertension Mother   . Other Mother     nerves  . Throat cancer  Father     deceased  . Coronary artery disease Sister   . Diabetes Sister   . Heart disease Sister   . Hypertension Brother   . Alcohol abuse Paternal Aunt   . Alcohol abuse Cousin   . Drug abuse Son     cocaine dependent    History   Social History  . Marital Status: Legally Separated    Spouse Name: N/A  . Number of Children: N/A  . 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 Smoking status: Former Smoker -- 0.50 packs/day for 40 years    Types: Cigarettes    Quit date: 04/15/2009  . Smokeless tobacco: Never Used  . Alcohol Use: 30.0 oz/week    50 Shots of liquor per week     Comment: Pt in treatment for alcohol dependence. Last date of use 02/13/11.,  Very very little 01/27/13  . Drug Use: No  . Sexual Activity: Not on file  Other Topics Concern  . 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   Working with disabled adults.    Plays golf weekly              Outpatient Encounter Prescriptions as of 07/20/2014  Medication Sig  . amLODipine (NORVASC) 10 MG tablet Take 1 tablet (10 mg total) by mouth daily.  Marland Kitchen aspirin 81 MG chewable tablet Chew 81 mg by mouth daily.   Marland Kitchen atorvastatin (LIPITOR) 20 MG tablet Take 1 tablet (20 mg total) by mouth daily. (Patient not taking: Reported on 07/20/2014)  . atorvastatin (LIPITOR) 20 MG tablet TAKE 1 TABLET BY MOUTH EVERY DAY (Patient not taking: Reported on 07/20/2014)  . benzonatate (TESSALON) 200 MG capsule One four times daily as needed for cough (Patient not taking: Reported on 04/20/2014)  . cetirizine (ZYRTEC) 10 MG tablet Take 20 mg by  mouth daily.   Marland Kitchen diptheria-tetanus toxoids [facility]) 2-2 LF/0.5ML injection   . MAGNESIUM PO Take 1 tablet by mouth daily as needed (cramp).  . metFORMIN (GLUCOPHAGE-XR) 500 MG 24 hr tablet Take 1 tablet (500 mg total) by mouth daily with breakfast.  . Multiple Vitamins-Minerals (CENTRUM SILVER PO) Take 1 tablet by mouth daily.   Marland Kitchen omeprazole (PRILOSEC) 20 MG capsule Take 20 mg by mouth daily. From the [place]  . Potassium (POTASSIMIN PO) Take 1 tablet by mouth daily as needed (cramps).  . sildenafil (VIAGRA) 100 MG tablet Take 50 mg by mouth as needed for erectile dysfunction.  Marland Kitchen telmisartan (MICARDIS) 80 MG tablet Take 1 tablet by mouth two  times daily  . traZODone (DESYREL) 50 MG tablet Take 50 mg by mouth at bedtime as needed for sleep.  Marland Kitchen triamterene-hydrochlorothiazide (MAXZIDE-25) 37.5-25 MG per tablet Take 1 tablet by mouth  daily    EXAM:  BP 120/80 mmHg  Temp(Src) 97.6 F (36.4 C) (Oral)  Ht 5\' 11"  (1.803 m)  Wt 212 lb 8 oz (96.389 kg)  BMI 29.65 kg/m2  Body mass index is 29.65 kg/(m^2).  GENERAL: vitals reviewed and listed above, alert, oriented, appears well hydrated and in no acute distress HEENT: atraumatic, conjunctiva  clear, no obvious abnormalities on inspection of external nose and ears NECK: no obvious masses on inspection palpation  LUNGS: clear to auscultation bilaterally, no wheezes, rales or rhonchi,  CV: HRRR, no clubbing cyanosis or  peripheral edema nl cap refill  .eft lower back flank pain but no tenderness  abd soft no g or rbound andreported no test swelling neg psoas sign and no rash  MS: moves all extremities without noticeable focal  abnormality PSYCH: pleasant and cooperative,  Lab Results  Component Value Date   WBC 6.1 09/12/2013   HGB 13.8 09/12/2013   HCT 42.0 09/12/2013   PLT 364 09/12/2013   GLUCOSE 88 04/20/2014   CHOL 147 04/20/2014   TRIG 129.0 04/20/2014   HDL 42.20 04/20/2014   LDLDIRECT 66.7 01/31/2011   LDLCALC 79 04/20/2014   ALT  20 06/30/2012   AST 21 06/30/2012   NA 138 04/20/2014   K 4.1  04/20/2014   CL 106 04/20/2014   CREATININE 1.7* 04/20/2014   BUN 14 04/20/2014   CO2 24 04/20/2014   TSH 0.76 07/09/2012   PSA 0.82 09/07/2009   HGBA1C 6.7* 04/20/2014   MICROALBUR 141.5* 09/14/2009   BP Readings from Last 3 Encounters:  07/20/14 120/80  05/04/14 157/82  04/20/14 124/76   Wt Readings from Last 3 Encounters:  07/20/14 212 lb 8 oz (96.389 kg)  05/04/14 220 lb (99.791 kg)  04/20/14 221 lb 11.2 oz (100.562 kg)    ASSESSMENT AND PLAN:  Discussed the following assessment and plan:  Acute left flank pain - Plan: POCT urinalysis dipstick, CT RENAL STONE STUDY, CANCELED: CT RENAL STONE STUDY, CANCELED: CT Abd Limited W/O Cm  Renal insufficiency  Essential hypertension  Proteinuria  History of back surgery - [place] years ago  Because of location radiation get renal study no contrast. Check for stone etc. consir atypical neuro back radicular pain  Possible as readiated to anterior thigh  Exam reassuring otherwise   Getting primary care and  Some disease care at the [facility]   Problematic  What to fu but  Assume all is taken care of per patient -Patient advised to return or notify health care team  if symptoms worsen ,persist or new concerns arise.  Patient Instructions  Please have  [facility] send [place] copies of all tests done so we are not having to repeat  labs in our  Records .  Glad your blood pressure and a1c is doing great.  Consider kidney stone or  Back nerve pain  Causing  sx.   Getting kidney ct without  Contrast .  To check  rarea Tylenol  For pain in the short run dont take advil aleve .       Standley Brooking. Panosh M.D.

## 2014-08-08 ENCOUNTER — Encounter: Payer: Self-pay | Admitting: Internal Medicine

## 2014-10-10 ENCOUNTER — Other Ambulatory Visit: Payer: Self-pay

## 2014-10-24 ENCOUNTER — Telehealth: Payer: Self-pay | Admitting: *Deleted

## 2014-10-24 ENCOUNTER — Telehealth: Payer: Self-pay | Admitting: Internal Medicine

## 2014-10-24 NOTE — Telephone Encounter (Signed)
Spoke to the pt.  Informed him that the [place] had called and spoke to me this morning.  Telmisartan is filled through Dr. Kyla Balzarine office.  [facility] will [facility] (gave them phone number) to change the medication to losartan since telmisartan is not covered through the [place].

## 2014-10-24 NOTE — Telephone Encounter (Signed)
A representative from the [place] called and stated that the micardis is not on the [place] formulary. They recommend that the patient try losartan if that is appropriate therapy for him. If the patient does indeed need to remain on the micardis then they will need justification in written form faxed to the [place] at [phone]. If appropriate for the patient to try losartan please call [phone] and ask for Dr Pearletha Furl nurse so that they can make this change. Thanks, MI

## 2014-10-24 NOTE — Telephone Encounter (Signed)
WILL REVIEW  WITH DR Johnsie Cancel  IF  PT MAY  CHANGE MICARDIS TO LOSARTAN .Adonis Housekeeper

## 2014-10-24 NOTE — Telephone Encounter (Signed)
Pt need new rx telmisartan 80 mg #90 w/refills. Pt will pick up rx and take to [place]

## 2014-10-25 NOTE — Telephone Encounter (Signed)
Ok to change micardis to cozaar 50 mg daily I didn't prescribe it

## 2014-10-26 NOTE — Telephone Encounter (Signed)
Follow up     Returning a call to the nurse.  They do not have micardis.  Will losartan be ok to substitute?  If not fax why they cannot substitute to [phone].

## 2014-10-26 NOTE — Telephone Encounter (Signed)
WILL FAX  THIS  MESSAGE TO   [facility]   UNABLE TO  BE  ON HOLD  FOR   SUCH  A  LONG PERIOD OF TIME  NOTE  BELOW  RESPONSE .Adonis Housekeeper

## 2014-10-26 NOTE — Telephone Encounter (Signed)
LMTCB ./CY 

## 2014-12-24 ENCOUNTER — Other Ambulatory Visit: Payer: Self-pay | Admitting: Cardiovascular Disease

## 2014-12-26 NOTE — Telephone Encounter (Signed)
Approved      Disp Refills Start End    telmisartan (MICARDIS) 80 MG tablet 180 tablet 3 01/21/2014     Sig:  Take 1 tablet by mouth two times daily    Class:  Normal    DAW:  No    Authorizing Provider:  Josue Hector, MD    Ordering User:  Juventino Slovak, CMA

## 2014-12-27 ENCOUNTER — Other Ambulatory Visit: Payer: Self-pay | Admitting: Family

## 2014-12-28 ENCOUNTER — Other Ambulatory Visit: Payer: Self-pay | Admitting: Internal Medicine

## 2014-12-28 NOTE — Telephone Encounter (Signed)
Denied.  Message sent that the pt should contact the office.  Medication not filled since 2014.

## 2015-01-17 ENCOUNTER — Telehealth: Payer: Self-pay | Admitting: *Deleted

## 2015-01-17 NOTE — Telephone Encounter (Signed)
Pt states he is calling to see if Dr. Paulla Dolly is the doctor that wrote him for gout medication.  Informed pt [facility] had only seen him for debridement of his toenails.  Pt states he found out it was prescribed by a PA in another office.

## 2015-02-07 ENCOUNTER — Encounter: Payer: Self-pay | Admitting: Internal Medicine

## 2015-02-07 ENCOUNTER — Ambulatory Visit (INDEPENDENT_AMBULATORY_CARE_PROVIDER_SITE_OTHER): Payer: Medicare Other | Admitting: Internal Medicine

## 2015-02-07 VITALS — BP 130/76 | HR 59 | Temp 97.7°F | Wt 208.0 lb

## 2015-02-07 DIAGNOSIS — R05 Cough: Secondary | ICD-10-CM

## 2015-02-07 DIAGNOSIS — R059 Cough, unspecified: Secondary | ICD-10-CM

## 2015-02-07 NOTE — Patient Instructions (Signed)
Your exam is reassuring and lungs are clear and oxygen is normal.  Suspect a mild chest cold or allergy that should resolve on its own.  Can try the tessalon perles .   If needed  No cough drops.  i fnot resolving in another 2 weeks or if get fever  Shortness of breath    Or other   Concerns  Recheck exam   Evaluation.    Cough, Adult Coughing is a reflex that clears your throat and your airways. Coughing helps to heal and protect your lungs. It is normal to cough occasionally, but a cough that happens with other symptoms or lasts a long time may be a sign of a condition that needs treatment. A cough may last only 2-3 weeks (acute), or it may last longer than 8 weeks (chronic). CAUSES Coughing is commonly caused by:  Breathing in substances that irritate your lungs.  A viral or bacterial respiratory infection.  Allergies.  Asthma.  Postnasal drip.  Smoking.  Acid backing up from the stomach into the esophagus (gastroesophageal reflux).  Certain medicines.  Chronic lung problems, including COPD (or rarely, lung cancer).  Other medical conditions such as heart failure. HOME CARE INSTRUCTIONS  Pay attention to any changes in your symptoms. Take these actions to help with your discomfort:  Take medicines only as told by your health care provider.  If you were prescribed an antibiotic medicine, take it as told by your health care provider. Do not stop taking the antibiotic even if you start to feel better.  Talk with your health care provider before you take a cough suppressant medicine.  Drink enough fluid to keep your urine clear or pale yellow.  If the air is dry, use a cold steam vaporizer or humidifier in your bedroom or your home to help loosen secretions.  Avoid anything that causes you to cough at work or at home.  If your cough is worse at night, try sleeping in a semi-upright position.  Avoid cigarette smoke. If you smoke, quit smoking. If you need help  quitting, ask your health care provider.  Avoid caffeine.  Avoid alcohol.  Rest as needed. SEEK MEDICAL CARE IF:   You have new symptoms.  You cough up pus.  Your cough does not get better after 2-3 weeks, or your cough gets worse.  You cannot control your cough with suppressant medicines and you are losing sleep.  You develop pain that is getting worse or pain that is not controlled with pain medicines.  You have a fever.  You have unexplained weight loss.  You have night sweats. SEEK IMMEDIATE MEDICAL CARE IF:  You cough up blood.  You have difficulty breathing.  Your heartbeat is very fast.   This information is not intended to replace advice given to you by your health care provider. Make sure you discuss any questions you have with your health care provider.   Document Released: 09/28/2010 Document Revised: 12/21/2014 Document Reviewed: 06/08/2014 Elsevier Interactive Patient Education [employer].

## 2015-02-07 NOTE — Progress Notes (Signed)
Pre visit review using our clinic review tool, if applicable. No additional management support is needed unless otherwise documented below in the visit note.   Chief Complaint  Patient presents with  . Cough    X2wks    HPI: Patient Jeremy Black  comes in today for SDA for  new problem evaluation. Onset about 2 weeks cough dry  no fever sob  Some fatigue and no uri sx wife wanted him to be checked   No hemoptysis  hda eye check and flu vaccine recently  No tobacco or ets . No  ROS: See pertinent positives and negatives per HPI. No fever chills  cp  Past Medical History  Diagnosis Date  . Hyperlipidemia   . Hypertension   . ED (erectile dysfunction)   . Proteinuria     past record showed 500mg  protein excretion per 24 hours eval by renal  . PTSD (post-traumatic stress disorder)     from [place] experience [place]  . Normal nuclear stress test     myoview neg 12 09 except for HT  . Other and unspecified alcohol dependence, unspecified drinking behavior     date of last use 02/13/11  . [facility]  . Diabetes mellitus without complication ([place])   . Allergy   . Arthritis   . Cataract   . GERD (gastroesophageal reflux disease)     Family History  Problem Relation Age of Onset  . Diabetes Mother   . Hypertension Mother   . Other Mother     nerves  . Throat cancer Father     deceased  . Coronary artery disease Sister   . Diabetes Sister   . Heart disease Sister   . Hypertension Brother   . Alcohol abuse Paternal Aunt   . Alcohol abuse Cousin   . Drug abuse Son     cocaine dependent    Social History   Social History  . Marital Status: Legally Separated    Spouse Name: N/A  . Number of Children: N/A  . 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 Smoking status: Former Smoker -- 0.50 packs/day for 40 years    Types: Cigarettes    Quit date:  04/15/2009  . Smokeless tobacco: Never Used  . Alcohol Use: 30.0 oz/week    50 Shots of liquor per week     Comment: Pt in treatment for alcohol dependence. Last date of use 02/13/11.,  Very very little 01/27/13  . Drug Use: No  . Sexual Activity: Not Asked   Other Topics Concern  . 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   Working with disabled adults.    Plays golf weekly              Outpatient Prescriptions Prior to Visit  Medication Sig Dispense Refill  . amLODipine (NORVASC) 10 MG tablet Take 1 tablet (10 mg total) by mouth daily.  90 tablet 3  . aspirin 81 MG chewable tablet Chew 81 mg by mouth daily.     Marland Kitchen atorvastatin (LIPITOR) 20 MG tablet Take 1 tablet (20 mg total) by mouth daily. 90 tablet 0  . atorvastatin (LIPITOR) 20 MG tablet TAKE 1 TABLET BY MOUTH EVERY DAY 90 tablet 1  . cetirizine (ZYRTEC) 10 MG tablet Take 20 mg by mouth daily.     Marland Kitchen diptheria-tetanus toxoids [facility]) 2-2 LF/0.5ML injection     . metFORMIN (GLUCOPHAGE-XR) 500 MG 24 hr tablet Take 1 tablet (500 mg total) by mouth daily with breakfast. 90 tablet 0  . Multiple Vitamins-Minerals (CENTRUM SILVER PO) Take 1 tablet by mouth daily.     Marland Kitchen omeprazole (PRILOSEC) 20 MG capsule Take 20 mg by mouth daily. From the [place]    . sildenafil (VIAGRA) 100 MG tablet Take 50 mg by mouth as needed for erectile dysfunction.    Marland Kitchen telmisartan (MICARDIS) 80 MG tablet Take 1 tablet by mouth two  times daily 180 tablet 3  . traZODone (DESYREL) 50 MG tablet Take 50 mg by mouth at bedtime as needed for sleep.    Marland Kitchen triamterene-hydrochlorothiazide (MAXZIDE-25) 37.5-25 MG per tablet Take 1 tablet by mouth   daily 90 tablet 2  . benzonatate (TESSALON) 200 MG capsule One four times daily as needed for cough (Patient not taking: Reported on 04/20/2014) 40 capsule 2  . MAGNESIUM PO Take 1 tablet by mouth daily as needed (cramp).    . Potassium (POTASSIMIN PO) Take 1 tablet by mouth daily as needed (cramps).     No facility-administered medications prior to visit.     EXAM:  BP 130/76 mmHg  Pulse 59  Temp(Src) 97.7 F (36.5 C) (Oral)  Wt 208 lb (94.348 kg)  SpO2 98%  Body mass index is 29.02 kg/(m^2).  GENERAL: vitals reviewed and listed above, alert, oriented, appears well hydrated and in no acute distress HEENT: atraumatic, conjunctiva  clear, no obvious abnormalities on inspection of external nose and earstms clear  OP : no lesion edema or exudate face nt no nasal cdrainage NECK: no obvious masses on inspection palpation  LUNGS: clear to auscultation bilaterally, no wheezes, rales or rhonchi, good air movement CV: HRRR, no clubbing cyanosis or  peripheral edema nl cap refill  MS: moves all extremities without noticeable focal  abnormality PSYCH: pleasant and cooperative, no obvious depression or anxiety  ASSESSMENT AND PLAN:  Discussed the following assessment and plan:  Cough - nl exam suspect resp infection viral vs reactive  no alarm feature  follow if persitent  recheck or worse etc  No cough drops          -Patient advised to return or notify health care team  if symptoms worsen ,persist or new concerns arise.  Patient Instructions  Your exam is reassuring and lungs are clear and oxygen is normal.  Suspect a mild chest cold or allergy that should resolve on its own.  Can try the tessalon perles .   If needed  No cough drops.  i fnot resolving in another 2 weeks or if get fever  Shortness of breath    Or other   Concerns  Recheck exam   Evaluation.    Cough, Adult Coughing is a reflex that clears your throat and your airways. Coughing helps to heal and protect your lungs.  It is normal to cough occasionally, but a cough that happens with other symptoms or lasts a long time may be a  sign of a condition that needs treatment. A cough may last only 2-3 weeks (acute), or it may last longer than 8 weeks (chronic). CAUSES Coughing is commonly caused by:  Breathing in substances that irritate your lungs.  A viral or bacterial respiratory infection.  Allergies.  Asthma.  Postnasal drip.  Smoking.  Acid backing up from the stomach into the esophagus (gastroesophageal reflux).  Certain medicines.  Chronic lung problems, including COPD (or rarely, lung cancer).  Other medical conditions such as heart failure. HOME CARE INSTRUCTIONS  Pay attention to any changes in your symptoms. Take these actions to help with your discomfort:  Take medicines only as told by your health care provider.  If you were prescribed an antibiotic medicine, take it as told by your health care provider. Do not stop taking the antibiotic even if you start to feel better.  Talk with your health care provider before you take a cough suppressant medicine.  Drink enough fluid to keep your urine clear or pale yellow.  If the air is dry, use a cold steam vaporizer or humidifier in your bedroom or your home to help loosen secretions.  Avoid anything that causes you to cough at work or at home.  If your cough is worse at night, try sleeping in a semi-upright position.  Avoid cigarette smoke. If you smoke, quit smoking. If you need help quitting, ask your health care provider.  Avoid caffeine.  Avoid alcohol.  Rest as needed. SEEK MEDICAL CARE IF:   You have new symptoms.  You cough up pus.  Your cough does not get better after 2-3 weeks, or your cough gets worse.  You cannot control your cough with suppressant medicines and you are losing sleep.  You develop pain that is getting worse or pain that is not controlled with pain medicines.  You have a fever.  You have  unexplained weight loss.  You have night sweats. SEEK IMMEDIATE MEDICAL CARE IF:  You cough up blood.  You have difficulty breathing.  Your heartbeat is very fast.   This information is not intended to replace advice given to you by your health care provider. Make sure you discuss any questions you have with your health care provider.   Document Released: 09/28/2010 Document Revised: 12/21/2014 Document Reviewed: 06/08/2014 Elsevier Interactive Patient Education 2016 [place] K. Panosh M.D.

## 2015-02-11 ENCOUNTER — Other Ambulatory Visit: Payer: Self-pay | Admitting: Cardiovascular Disease

## 2015-03-25 ENCOUNTER — Other Ambulatory Visit: Payer: Self-pay | Admitting: Cardiovascular Disease

## 2015-03-27 ENCOUNTER — Telehealth: Payer: Self-pay | Admitting: Internal Medicine

## 2015-03-27 MED ORDER — VALACYCLOVIR HCL 1 G PO TABS: 1000.0000 mg | ORAL_TABLET | Freq: Every day | ORAL | Status: DC

## 2015-03-27 NOTE — Telephone Encounter (Signed)
Message not clear  Need more info  Who dx  ( they can give him rx if needed in the short run) ?bring records  . Was he on a med from someone else ( we can refill  This ) until we get him an appt .  If emergent we can work him .

## 2015-03-27 NOTE — Telephone Encounter (Signed)
Jeremy Black called saying he was recently diagnosed with Herpes and has no medication. He'd like to see Dr. Regis Bill asap before she goes on vacation if possible. Please give him a call.  Pt's ph# [phone] Thank you.

## 2015-03-27 NOTE — Telephone Encounter (Signed)
Will send in suppressive therapy in the interim until can come in and discuss.

## 2015-03-27 NOTE — Telephone Encounter (Signed)
Spoke to the pt.  Currently does not have lesions or sx.  Comes and goes.  Has made a follow up appt on 04/13/15 @ 10:45.  Per [facility], will start medication [facility] [address]

## 2015-03-27 NOTE — Telephone Encounter (Signed)
Spoke to the pt.  Testing was not ordered by a doctor.  It was just a place you can walk in, per the pt, and request testing.  Came back positive for Herpes I and II.  Pt currently has lesions on his penis that come and go.  Wife has tested positive for herpes in the past.  Pt to drop off paper work at the front desk.

## 2015-04-13 ENCOUNTER — Encounter: Payer: Self-pay | Admitting: Internal Medicine

## 2015-04-13 ENCOUNTER — Ambulatory Visit (INDEPENDENT_AMBULATORY_CARE_PROVIDER_SITE_OTHER): Payer: Medicare Other | Admitting: Internal Medicine

## 2015-04-13 VITALS — BP 124/80 | Temp 98.0°F | Ht 71.0 in | Wt 213.2 lb

## 2015-04-13 DIAGNOSIS — R894 Abnormal immunological findings in specimens from other organs, systems and tissues: Secondary | ICD-10-CM | POA: Diagnosis not present

## 2015-04-13 DIAGNOSIS — R053 Chronic cough: Secondary | ICD-10-CM

## 2015-04-13 DIAGNOSIS — R05 Cough: Secondary | ICD-10-CM

## 2015-04-13 DIAGNOSIS — R768 Other specified abnormal immunological findings in serum: Secondary | ICD-10-CM

## 2015-04-13 NOTE — Patient Instructions (Addendum)
Can continue   on suppressive therapy.   As discussed .  This is   Past infection.     Agree with seeing   Your counselor .     Chest exam is clear today  Poss allergy or   Nasal congestion .    And post nasal drainage  And aftin could cause    Try Flonase  Every day for at least 2 weeks .     Consider  Chest x ray if not getting  Better.
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
## 2015-05-16 ENCOUNTER — Other Ambulatory Visit: Payer: Self-pay | Admitting: Cardiovascular Disease

## 2015-05-16 NOTE — Telephone Encounter (Signed)
[facility] has not been seen in our office since 2014.

## 2015-05-16 NOTE — Telephone Encounter (Signed)
See phone note from 10/24/14. Cannot find where this issue was ever resolved. Please advise on refill.

## 2015-07-03 ENCOUNTER — Other Ambulatory Visit: Payer: Self-pay | Admitting: Cardiovascular Disease

## 2015-07-10 ENCOUNTER — Telehealth: Payer: Self-pay | Admitting: Cardiovascular Disease

## 2015-07-10 MED ORDER — TELMISARTAN 80 MG PO TABS: ORAL_TABLET | ORAL | Status: DC

## 2015-07-10 NOTE — Telephone Encounter (Signed)
°*  STAT* If patient is at the pharmacy, call can be transferred to refill team.   1. Which medications need to be refilled? (please list name of each medication and dose if known) Telmisartan 80mg    2. Which pharmacy/location (including street and city if local pharmacy) [facility] [address]  3. Do they need a 30 day or 90 day supply? 90  He is completely out and will be leaving in the morning for [place], [place]

## 2015-07-10 NOTE — Telephone Encounter (Signed)
° °*  STAT* If patient is at the pharmacy, call can be transferred to refill team.   1. Which medications need to be refilled? (please list name of each medication and dose if known) Telmisartan 80mg   2. Which pharmacy/location (including street and city if local pharmacy) is medication to be sent to? [facility] on [phone]  3. Do they need a 30 day or 90 day supply? 30 day supply ;  PT CALLED A FEW MINUTES AGO AND STATES HE GAVE THE WRONG PHARMACY. REQUESTING THIS MEDICATION BE SENT TO [facility] ON [address].

## 2015-07-10 NOTE — Telephone Encounter (Signed)
New message       [facility] .  Please call it in to [facility] [address].  Pt is on his way there.

## 2015-07-10 NOTE — Telephone Encounter (Signed)
Follow up      See previous message.  Pt is at the drug store to get presc.  He stated that he made an appt to see the doctor.

## 2015-07-10 NOTE — Telephone Encounter (Signed)
Patient has appointment with Dr. Johnsie Cancel on 08/24/15. Sent a months supply to local pharmacy and also sent to mail order.

## 2015-07-10 NOTE — Telephone Encounter (Signed)
informed pt to have [facility] call [facility]. pt said he would call and do that.

## 2015-07-21 ENCOUNTER — Other Ambulatory Visit: Payer: Self-pay | Admitting: Internal Medicine

## 2015-07-24 NOTE — Telephone Encounter (Signed)
Sent to the pharmacy by e-scribe. 

## 2015-07-24 NOTE — Telephone Encounter (Signed)
Ok to refill x 6 months 

## 2015-08-24 ENCOUNTER — Ambulatory Visit: Payer: Medicare Other | Admitting: Cardiovascular Disease

## 2015-09-18 ENCOUNTER — Other Ambulatory Visit: Payer: Self-pay | Admitting: Cardiovascular Disease

## 2015-09-20 NOTE — Telephone Encounter (Signed)
Please advise on request as per the following  Call Documentation      [name], RN at 07/10/2015 10:22 AM     Status: Signed       Expand All Collapse All   Patient has appointment with Dr. Johnsie Cancel on 08/24/15. Sent a months supply to local pharmacy and also sent to mail order.             Jeremy Black at 07/10/2015 9:36 AM     Status: Signed       Expand All Collapse All   Follow up      See previous message. Pt is at the drug store to get presc. He stated that he made an appt to see the doctor.      Also, he cancelled [facility] appointment [facility] Thanks, [place]

## 2016-04-01 ENCOUNTER — Encounter (HOSPITAL_COMMUNITY): Payer: Self-pay | Admitting: Emergency Medicine

## 2016-04-01 ENCOUNTER — Emergency Department (HOSPITAL_COMMUNITY)
Admission: EM | Admit: 2016-04-01 | Discharge: 2016-04-01 | Disposition: A | Discharge status: Home / Self Care | Payer: Medicare Other | Attending: Dermatology | Admitting: Dermatology

## 2016-04-01 ENCOUNTER — Emergency Department (HOSPITAL_COMMUNITY): Payer: Medicare Other

## 2016-04-01 DIAGNOSIS — M542 Cervicalgia: Secondary | ICD-10-CM | POA: Insufficient documentation

## 2016-04-01 DIAGNOSIS — Z7984 Long term (current) use of oral hypoglycemic drugs: Secondary | ICD-10-CM | POA: Insufficient documentation

## 2016-04-01 DIAGNOSIS — Y939 Activity, unspecified: Secondary | ICD-10-CM | POA: Diagnosis not present

## 2016-04-01 DIAGNOSIS — Y999 Unspecified external cause status: Secondary | ICD-10-CM | POA: Diagnosis not present

## 2016-04-01 DIAGNOSIS — M545 Low back pain: Secondary | ICD-10-CM | POA: Diagnosis present

## 2016-04-01 DIAGNOSIS — Z87891 Personal history of nicotine dependence: Secondary | ICD-10-CM | POA: Diagnosis not present

## 2016-04-01 DIAGNOSIS — Z7982 Long term (current) use of aspirin: Secondary | ICD-10-CM | POA: Diagnosis not present

## 2016-04-01 DIAGNOSIS — E119 Type 2 diabetes mellitus without complications: Secondary | ICD-10-CM | POA: Insufficient documentation

## 2016-04-01 DIAGNOSIS — Y92410 Unspecified street and highway as the place of occurrence of the external cause: Secondary | ICD-10-CM | POA: Insufficient documentation

## 2016-04-01 DIAGNOSIS — I10 Essential (primary) hypertension: Secondary | ICD-10-CM | POA: Diagnosis not present

## 2016-04-01 DIAGNOSIS — Z5321 Procedure and treatment not carried out due to patient leaving prior to being seen by health care provider: Secondary | ICD-10-CM | POA: Diagnosis not present
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
## 2016-04-01 NOTE — ED Notes (Signed)
Hedges removed c collar.

## 2016-04-01 NOTE — ED Notes (Signed)
Hedges PA evaluating pt in triage 1.

## 2016-04-15 HISTORY — PX: PROSTATE BIOPSY: SHX241

## 2016-05-10 LAB — HM DIABETES EYE EXAM

## 2016-05-16 ENCOUNTER — Ambulatory Visit (INDEPENDENT_AMBULATORY_CARE_PROVIDER_SITE_OTHER): Payer: Medicare Other | Admitting: Sports Medicine

## 2016-05-16 ENCOUNTER — Encounter: Payer: Self-pay | Admitting: Sports Medicine

## 2016-05-16 VITALS — Ht 70.0 in | Wt 200.0 lb

## 2016-05-16 DIAGNOSIS — M65312 Trigger thumb, left thumb: Secondary | ICD-10-CM

## 2016-05-16 MED ORDER — METHYLPREDNISOLONE ACETATE 40 MG/ML IJ SUSP
40.0000 mg | Freq: Once | INTRAMUSCULAR | Status: AC
  Administered 2016-05-16: 40 mg via INTRA_ARTICULAR

## 2016-05-16 NOTE — Progress Notes (Signed)
  Jeremy Black - 71 y.o. male MRN [id number]  Date of birth: [birth year]  SUBJECTIVE:   CC: L. Trigger thumb  HPI: Patient states that he has been having issues with his left thumb. Over the last month he has had clicking in his left thumb with some associated palpable tenderness. He has a history of trigger finger in the past. 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 Patient has had injections before that have helped. Would like to get injection into thumb today. Right-hand-dominant  ROS per HPI.    HISTORY: Past Medical, Surgical, Social, and Family History Reviewed & Updated per EMR.    PHYSICAL EXAM:  Ht 5\' 10"  (1.778 m)   Wt 200 lb (90.7 kg)   BMI 28.70 kg/m   General: alert, well-developed, NAD, cooperative Msk: no joint swelling, no joint warmth, and no redness over joints. Left thumb with clicking of the proximal phalanx. Nodule palpated over the flexor tendon with mild tenderness.  Pulses: radial pulses intact  ASSESSMENT & PLAN:   A: Trigger thumb of left hand  P:  Trigger joint injection performed   Follow-up in 3 weeks   Procedure Note Consent obtained and verified. Area cleansed with alcohol swap x3. Topical analgesic spray: Ethyl chloride. Joint: Left trigger thumb Approached in typical fashion Completed without difficulty Meds: 1/2cc 1% Xylocaine plus half a cc 40 mg Depo-Medrol Medrol (20 mg) Needle: 5/8 inch 25-gauge  Aftercare instructions and Red flags advised.   Luiz Blare, DO 05/16/2016, 11:16 AM PGY-3, [facility]  Patient seen and evaluated with the resident. I agree with the above plan of care. Patient's left trigger thumb was injected today with cortisone. Patient tolerates this without difficulty. If symptoms persist, consider referral to hand surgery for possible A1 pulley release. Follow-up for ongoing or recalcitrant issues.

## 2016-06-06 ENCOUNTER — Ambulatory Visit (INDEPENDENT_AMBULATORY_CARE_PROVIDER_SITE_OTHER): Payer: Medicare Other | Admitting: Sports Medicine

## 2016-06-06 ENCOUNTER — Encounter: Payer: Self-pay | Admitting: Sports Medicine

## 2016-06-06 VITALS — BP 122/80 | Ht 70.0 in | Wt 199.0 lb

## 2016-06-06 DIAGNOSIS — M65312 Trigger thumb, left thumb: Secondary | ICD-10-CM

## 2016-06-07 ENCOUNTER — Encounter: Payer: Self-pay | Admitting: Family Medicine

## 2016-06-07 NOTE — Progress Notes (Signed)
   Subjective:    Patient ID: Jeremy Black, male    DOB: 05-29-1945, 71 y.o.   MRN: [id number]  HPI   Patient comes in today for follow-up on a left thumb trigger thumb. Cortisone injection did help but was not curative. He is still getting triggering of his thumb. Symptoms have been present now for about 4 months.    Review of Systems     Objective:   Physical Exam  Well-developed, well-nourished. No acute distress. Vital signs reviewed.  Left thumb: There continues to be active triggering of the thumb. Palpable tender nodule at the A1 pulley. Neurovascularly intact distally.      Assessment & Plan:  Left thumb trigger thumb  Patient continues to have symptoms despite a recent cortisone injection. I will refer him to Dr. Grandville Silos to discuss merits of surgical A1 pulley release. I will defer further workup and treatment to the discretion of Dr. Grandville Silos and the patient will follow-up with me as needed.

## 2016-09-17 LAB — HEPATIC FUNCTION PANEL
ALT: 15 (ref 10–40)
AST: 16 (ref 14–40)
Alkaline Phosphatase: 60 (ref 25–125)
Bilirubin, Total: 0.4

## 2016-09-17 LAB — BASIC METABOLIC PANEL
BUN: 31 — AB (ref 4–21)
Creatinine: 3.8 — AB (ref 0.6–1.3)
Glucose: 113
Potassium: 4 (ref 3.4–5.3)
Sodium: 139 (ref 137–147)

## 2016-09-18 ENCOUNTER — Ambulatory Visit (INDEPENDENT_AMBULATORY_CARE_PROVIDER_SITE_OTHER): Payer: Medicare Other

## 2016-09-18 VITALS — BP 104/60 | HR 70 | Ht 70.0 in | Wt 202.4 lb

## 2016-09-18 DIAGNOSIS — Z0000 Encounter for general adult medical examination without abnormal findings: Secondary | ICD-10-CM

## 2016-09-18 NOTE — Patient Instructions (Addendum)
Jeremy Black , Thank you for taking time to come for your Medicare Wellness Visit. I appreciate your ongoing commitment to your health goals. Please review the following plan we discussed and let me know if I can assist you in the future.   [name] will schedule your Abdominal aortic aneurysm due to smoking hx   [email or web address]  [name] to see if I can get a report from the [place] office.   Please bring the office a copy of your health agent and living will when completed   The last colonoscopy we have documented was 04/15/2004 Will call [name] with date. [phone];   Out documentation shows 2 Prevnar; Prevnar 26 The Centers for Disease Control are now recommending 2 pneumonia vaccinations after 31. The first is the Prevnar 13. This helps to boost your immunity to community acquired pneumonia as well as some protection from bacterial pneumonia  The 2nd is the pneumovax 23, which offers more broad protection!  Please consider taking these as this is your best protection against pneumonia.  Please see if you can get the dates of receipt for the PSV 23 - pneumovax   If you can bring a copy of your [facility] labs;     These are the goals we discussed: Goals    . Exercise 150 minutes per week (moderate activity)          Start exercising in the pool It is great therapy as well as easy on your joints Will go 3 times a week; don't aggravate the hip. Walking in the pool 30 minutes is equal to 2 hours on the ground       This is a list of the screening recommended for you and due dates:  Health Maintenance  Topic Date Due  .  Hepatitis C: One time screening is recommended by Center for Disease Control  (CDC) for  adults born from [birth year] through 1965.   Oct 25, 1945  . Complete foot exam   02/23/2014  . Colon Cancer Screening  04/15/2014  . Hemoglobin A1C  10/19/2014  . Pneumonia vaccines (2 of 2 - PPSV23) 01/26/2015  . Flu Shot  11/13/2016  . Eye exam for  diabetics  05/10/2017  . Tetanus Vaccine  04/20/2024        Fall Prevention in the Home Falls can cause injuries. They can happen to people of all ages. There are many things you can do to make your home safe and to help prevent falls. What can I do on the outside of my home?  Regularly fix the edges of walkways and driveways and fix any cracks.  Remove anything that might make you trip as you walk through a door, such as a raised step or threshold.  Trim any bushes or trees on the path to your home.  Use bright outdoor lighting.  Clear any walking paths of anything that might make someone trip, such as rocks or tools.  Regularly check to see if handrails are loose or broken. Make sure that both sides of any steps have handrails.  Any raised decks and porches should have guardrails on the edges.  Have any leaves, snow, or ice cleared regularly.  Use sand or salt on walking paths during winter.  Clean up any spills in your garage right away. This includes oil or grease spills. What can I do in the bathroom?  Use night lights.  Install grab bars by the toilet and in the tub and shower. Do not  use towel bars as grab bars.  Use non-skid mats or decals in the tub or shower.  If you need to sit down in the shower, use a plastic, non-slip stool.  Keep the floor dry. Clean up any water that spills on the floor as soon as it happens.  Remove soap buildup in the tub or shower regularly.  Attach bath mats securely with double-sided non-slip rug tape.  Do not have throw rugs and other things on the floor that can make you trip. What can I do in the bedroom?  Use night lights.  Make sure that you have a light by your bed that is easy to reach.  Do not use any sheets or blankets that are too big for your bed. They should not hang down onto the floor.  Have a firm chair that has side arms. You can use this for support while you get dressed.  Do not have throw rugs and other  things on the floor that can make you trip. What can I do in the kitchen?  Clean up any spills right away.  Avoid walking on wet floors.  Keep items that you use a lot in easy-to-reach places.  If you need to reach something above you, use a strong step stool that has a grab bar.  Keep electrical cords out of the way.  Do not use floor polish or wax that makes floors slippery. If you must use wax, use non-skid floor wax.  Do not have throw rugs and other things on the floor that can make you trip. What can I do with my stairs?  Do not leave any items on the stairs.  Make sure that there are handrails on both sides of the stairs and use them. Fix handrails that are broken or loose. Make sure that handrails are as long as the stairways.  Check any carpeting to make sure that it is firmly attached to the stairs. Fix any carpet that is loose or worn.  Avoid having throw rugs at the top or bottom of the stairs. If you do have throw rugs, attach them to the floor with carpet tape.  Make sure that you have a light switch at the top of the stairs and the bottom of the stairs. If you do not have them, ask someone to add them for you. What else can I do to help prevent falls?  Wear shoes that: ? Do not have high heels. ? Have rubber bottoms. ? Are comfortable and fit you well. ? Are closed at the toe. Do not wear sandals.  If you use a stepladder: ? Make sure that it is fully opened. Do not climb a closed stepladder. ? Make sure that both sides of the stepladder are locked into place. ? Ask someone to hold it for you, if possible.  Clearly mark and make sure that you can see: ? Any grab bars or handrails. ? First and last steps. ? Where the edge of each step is.  Use tools that help you move around (mobility aids) if they are needed. These include: ? Canes. ? Walkers. ? Scooters. ? Crutches.  Turn on the lights when you go into a dark area. Replace any light bulbs as soon as  they burn out.  Set up your furniture so you have a clear path. Avoid moving your furniture around.  If any of your floors are uneven, fix them.  If there are any pets around you, be aware of  where they are.  Review your medicines with your doctor. Some medicines can make you feel dizzy. This can increase your chance of falling. Ask your doctor what other things that you can do to help prevent falls. This information is not intended to replace advice given to you by your health care provider. Make sure you discuss any questions you have with your health care provider. Document Released: 01/26/2009 Document Revised: 09/07/2015 Document Reviewed: 05/06/2014 Elsevier Interactive Patient Education  2018 [place] Maintenance, Male A healthy lifestyle and preventive care is important for your health and wellness. Ask your health care provider about what schedule of regular examinations is right for you. What should I know about weight and diet? Eat a Healthy Diet  Eat plenty of vegetables, fruits, whole grains, low-fat dairy products, and lean protein.  Do not eat a lot of foods high in solid fats, added sugars, or salt.  Maintain a Healthy Weight Regular exercise can help you achieve or maintain a healthy weight. You should:  Do at least 150 minutes of exercise each week. The exercise should increase your heart rate and make you sweat (moderate-intensity exercise).  Do strength-training exercises at least twice a week.  Watch Your Levels of Cholesterol and Blood Lipids  Have your blood tested for lipids and cholesterol every 5 years starting at 71 years of age. If you are at high risk for heart disease, you should start having your blood tested when you are 71 years old. You may need to have your cholesterol levels checked more often if: ? Your lipid or cholesterol levels are high. ? You are older than 71 years of age. ? You are at high risk for heart disease.  What  should I know about cancer screening? Many types of cancers can be detected early and may often be prevented. Lung Cancer  You should be screened every year for lung cancer if: ? You are a current smoker who has smoked for at least 30 years. ? You are a former smoker who has quit within the past 15 years.  Talk to your health care provider about your screening options, when you should start screening, and how often you should be screened.  Colorectal Cancer  Routine colorectal cancer screening usually begins at 71 years of age and should be repeated every 5-10 years until you are 71 years old. You may need to be screened more often if early forms of precancerous polyps or small growths are found. Your health care provider may recommend screening at an earlier age if you have risk factors for colon cancer.  Your health care provider may recommend using home test kits to check for hidden blood in the stool.  A small camera at the end of a tube can be used to examine your colon (sigmoidoscopy or colonoscopy). This checks for the earliest forms of colorectal cancer.  Prostate and Testicular Cancer  Depending on your age and overall health, your health care provider may do certain tests to screen for prostate and testicular cancer.  Talk to your health care provider about any symptoms or concerns you have about testicular or prostate cancer.  Skin Cancer  Check your skin from head to toe regularly.  Tell your health care provider about any new moles or changes in moles, especially if: ? There is a change in a mole's size, shape, or color. ? You have a mole that is larger than a pencil eraser.  Always use sunscreen. Apply  sunscreen liberally and repeat throughout the day.  Protect yourself by wearing long sleeves, pants, a wide-brimmed hat, and sunglasses when outside.  What should I know about heart disease, diabetes, and high blood pressure?  If you are 26-59 years of age, have your  blood pressure checked every 3-5 years. If you are 20 years of age or older, have your blood pressure checked every year. You should have your blood pressure measured twice-once when you are at a hospital or clinic, and once when you are not at a hospital or clinic. Record the average of the two measurements. To check your blood pressure when you are not at a hospital or clinic, you can use: ? An automated blood pressure machine at a pharmacy. ? A home blood pressure monitor.  Talk to your health care provider about your target blood pressure.  If you are between 54-57 years old, ask your health care provider if you should take aspirin to prevent heart disease.  Have regular diabetes screenings by checking your fasting blood sugar level. ? If you are at a normal weight and have a low risk for diabetes, have this test once every three years after the age of 53. ? If you are overweight and have a high risk for diabetes, consider being tested at a younger age or more often.  A one-time screening for abdominal aortic aneurysm (AAA) by ultrasound is recommended for men aged 1-75 years who are current or former smokers. What should I know about preventing infection? Hepatitis B If you have a higher risk for hepatitis B, you should be screened for this virus. Talk with your health care provider to find out if you are at risk for hepatitis B infection. Hepatitis C Blood testing is recommended for:  Everyone born from [birth year] through 1965.  Anyone with known risk factors for hepatitis C.  Sexually Transmitted Diseases (STDs)  You should be screened each year for STDs including gonorrhea and chlamydia if: ? You are sexually active and are younger than 71 years of age. ? You are older than 71 years of age and your health care provider tells you that you are at risk for this type of infection. ? Your sexual activity has changed since you were last screened and you are at an increased risk for chlamydia  or gonorrhea. Ask your health care provider if you are at risk.  Talk with your health care provider about whether you are at high risk of being infected with HIV. Your health care provider may recommend a prescription medicine to help prevent HIV infection.  What else can I do?  Schedule regular health, dental, and eye exams.  Stay current with your vaccines (immunizations).  Do not use any tobacco products, such as cigarettes, chewing tobacco, and e-cigarettes. If you need help quitting, ask your health care provider.  Limit alcohol intake to no more than 2 drinks per day. One drink equals 12 ounces of beer, 5 ounces of wine, or 1 ounces of hard liquor.  Do not use street drugs.  Do not share needles.  Ask your health care provider for help if you need support or information about quitting drugs.  Tell your health care provider if you often feel depressed.  Tell your health care provider if you have ever been abused or do not feel safe at home. This information is not intended to replace advice given to you by your health care provider. Make sure you discuss any questions you have with  your health care provider. Document Released: 09/28/2007 Document Revised: 11/29/2015 Document Reviewed: 01/03/2015 Elsevier Interactive Patient Education  [employer].
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
## 2016-10-17 ENCOUNTER — Other Ambulatory Visit: Payer: Self-pay | Admitting: Orthopedic Surgery

## 2016-10-17 ENCOUNTER — Encounter (HOSPITAL_BASED_OUTPATIENT_CLINIC_OR_DEPARTMENT_OTHER): Payer: Self-pay | Admitting: *Deleted

## 2016-10-17 NOTE — H&P (Signed)
Jeremy Black is an 71 y.o. male.   CC / Reason for Visit: Left thumb pain HPI: This patient returns reevaluation, indicating that his left trigger thumb resolved following the 2 injections earlier this spring, but have recurred.  He desires release.  He also continues to have some achy pain at the left [facility] joint.  HPI 07-09-16: This patient returns to clinic today for follow-up of his left trigger thumb.  He indicates that it's better, but not higher percent well.  There is still some minor locking catching.  He does indicate that he has pain at the base of his thumb and that is his major problem today.  He reports that it's painful to open bottles, or turning a key or any other type of pinching activity.  He has not yet been treated for this in any way.  He does not wish to take oral medications as he reports that he has to many medications at this time.    HPI 06/12/2016:This patient is referred to [place] by Dr. Yvetta Coder for a left trigger thumb.  He has been experiencing locking and catching into his left thumb for approximately 2-3 months.  He indicates that he had one injection at the beginning of the month which made things better but not completely well.  He arrives in clinic today indicating that there is still some residual locking and catching and is here for further evaluation and treatment.  The patient has gout, is a prediabetic,and has hypertension.  Past Medical History:  Diagnosis Date  . Allergy   . Arthritis   . Cataract   . Diabetes mellitus without complication ([place])   . ED (erectile dysfunction)   . GERD (gastroesophageal reflux disease)   . Hyperlipidemia   . Hypertension   . Normal nuclear stress test    myoview neg 12 09 except for HT  . Other and unspecified alcohol dependence, unspecified drinking behavior    date of last use 02/13/11  . Proteinuria    past record showed 500mg  protein excretion per 24 hours eval by renal  . PTSD (post-traumatic stress disorder)    from [place] experience [place]  . S[facility] strict[facility]    Past Surgical History:  Procedure Laterality Date  . ls spinal surgery    . NM MYOVIEW LTD  12/09   neg except for HT    Family History  Problem Relation Age of Onset  . Diabetes Mother   . Hypertension Mother   . Other Mother        nerves  . Throat cancer Father        deceased  . Coronary artery disease Sister   . Diabetes Sister   . Heart disease Sister   . Hypertension Brother   . Alcohol abuse Paternal Aunt   . Alcohol abuse Cousin   . Drug abuse Son        cocaine dependent   Social History:  reports that he quit smoking about 7 years ago. His smoking use included Cigarettes. He has a 20.00 pack-year smoking history. He has never used smokeless tobacco. He reports that he drinks about 30.0 oz of alcohol per week . He reports that he does not use drugs.  Allergies:  Allergies  Allergen Reactions  . Codeine Phosphate     REACTION: unspecified  . Lisinopril Cough    No prescriptions prior to admission.    No results found for this  or any previous visit (from the past 48 hour(s)). No results found.  ROS  Height 5\' 10"  (1.778 m), weight 90.7 kg (200 lb). Physical Exam  Constitutional:  WD, WN, NAD HEENT:  NCAT, EOMI Neuro/Psych:  Alert & oriented to person, place, and time; appropriate mood & affect Lymphatic: No generalized UE edema or lymphadenopathy Extremities / MSK:  Both UE are normal with respect to appearance, ranges of motion, joint stability, muscle strength/tone, sensation, & perfusion except as otherwise noted:  The left thumb still actively locks and catches throughout each flexion cycle.  There is mild pain with palpation over the A1 pulley although inducible locking catching is possible.  The left TMC joint is painful to palpate.  Grind testing exacerbates this pain.    Labs / Xrays:  No radiographic studies obtained today.  Assessment: Left trigger  thumb-injected 05/23/2016, 07/09/2016--recurrent  Plan:  The findings are discussed with the patient.  We discussed options.  He would like to defer any additional treatment today for the [facility], including injection, but would like to proceed with left trigger thumb release.  This is scheduled for tomorrow. The details of the operative procedure were discussed with the patient.  Questions were invited and answered.  In addition to the goal of the procedure, the risks of the procedure to include but not limited to bleeding; infection; damage to the nerves or blood vessels that could result in bleeding, numbness, weakness, chronic pain, and the need for additional procedures; stiffness; the need for revision surgery; and anesthetic risks were reviewed.  No specific outcome was guaranteed or implied.  Informed consent was obtained.  Tamecca Artiga A., MD 10/17/2016, 5:31 PM

## 2016-10-18 ENCOUNTER — Encounter (HOSPITAL_BASED_OUTPATIENT_CLINIC_OR_DEPARTMENT_OTHER): Payer: Self-pay

## 2016-10-18 ENCOUNTER — Ambulatory Visit (HOSPITAL_BASED_OUTPATIENT_CLINIC_OR_DEPARTMENT_OTHER): Payer: Medicare Other | Admitting: Anesthesiology

## 2016-10-18 ENCOUNTER — Ambulatory Visit (HOSPITAL_BASED_OUTPATIENT_CLINIC_OR_DEPARTMENT_OTHER)
Admission: RE | Admit: 2016-10-18 | Discharge: 2016-10-18 | Disposition: A | Discharge status: Home / Self Care | Payer: Medicare Other | Source: Ambulatory Visit | Attending: Orthopedic Surgery | Admitting: Orthopedic Surgery

## 2016-10-18 ENCOUNTER — Encounter (HOSPITAL_BASED_OUTPATIENT_CLINIC_OR_DEPARTMENT_OTHER)
Admission: RE | Disposition: A | Discharge status: Home / Self Care | Payer: Self-pay | Source: Ambulatory Visit | Attending: Orthopedic Surgery

## 2016-10-18 DIAGNOSIS — K219 Gastro-esophageal reflux disease without esophagitis: Secondary | ICD-10-CM | POA: Diagnosis not present

## 2016-10-18 DIAGNOSIS — Z79899 Other long term (current) drug therapy: Secondary | ICD-10-CM | POA: Diagnosis not present

## 2016-10-18 DIAGNOSIS — E785 Hyperlipidemia, unspecified: Secondary | ICD-10-CM | POA: Insufficient documentation

## 2016-10-18 DIAGNOSIS — M65312 Trigger thumb, left thumb: Secondary | ICD-10-CM | POA: Insufficient documentation

## 2016-10-18 DIAGNOSIS — E119 Type 2 diabetes mellitus without complications: Secondary | ICD-10-CM | POA: Insufficient documentation

## 2016-10-18 DIAGNOSIS — F4310 Post-traumatic stress disorder, unspecified: Secondary | ICD-10-CM | POA: Diagnosis not present

## 2016-10-18 DIAGNOSIS — M109 Gout, unspecified: Secondary | ICD-10-CM | POA: Insufficient documentation

## 2016-10-18 DIAGNOSIS — Z87891 Personal history of nicotine dependence: Secondary | ICD-10-CM | POA: Insufficient documentation

## 2016-10-18 DIAGNOSIS — I10 Essential (primary) hypertension: Secondary | ICD-10-CM | POA: Insufficient documentation

## 2016-10-18 HISTORY — PX: TRIGGER FINGER RELEASE: SHX641

## 2016-10-18 LAB — POCT I-STAT, CHEM 8
BUN: 32 mg/dL — ABNORMAL HIGH (ref 6–20)
CREATININE: 4.1 mg/dL — AB (ref 0.61–1.24)
Calcium, Ion: 1.14 mmol/L — ABNORMAL LOW (ref 1.15–1.40)
Chloride: 110 mmol/L (ref 101–111)
GLUCOSE: 94 mg/dL (ref 65–99)
HEMATOCRIT: 32 % — AB (ref 39.0–52.0)
HEMOGLOBIN: 10.9 g/dL — AB (ref 13.0–17.0)
POTASSIUM: 3.8 mmol/L (ref 3.5–5.1)
Sodium: 140 mmol/L (ref 135–145)
TCO2: 19 mmol/L (ref 0–100)

## 2016-10-18 SURGERY — RELEASE, A1 PULLEY, FOR TRIGGER FINGER
Anesthesia: Monitor Anesthesia Care | Site: Thumb | Laterality: Left

## 2016-10-18 MED ORDER — OXYCODONE HCL 5 MG PO TABS: 5.0000 mg | ORAL_TABLET | Freq: Once | ORAL | Status: DC | PRN

## 2016-10-18 MED ORDER — OXYCODONE HCL 5 MG PO TABS: 5.0000 mg | ORAL_TABLET | Freq: Four times a day (QID) | ORAL | 0 refills | Status: DC | PRN

## 2016-10-18 MED ORDER — BUPIVACAINE-EPINEPHRINE 0.5% -1:200000 IJ SOLN
INTRAMUSCULAR | Status: DC | PRN
  Administered 2016-10-18: 2 mL

## 2016-10-18 MED ORDER — HYDROMORPHONE HCL 1 MG/ML IJ SOLN: 0.2500 mg | INTRAMUSCULAR | Status: DC | PRN

## 2016-10-18 MED ORDER — PROPOFOL 500 MG/50ML IV EMUL
INTRAVENOUS | Status: DC | PRN
  Administered 2016-10-18: 20 ug/kg/min via INTRAVENOUS

## 2016-10-18 MED ORDER — LIDOCAINE HCL (CARDIAC) 20 MG/ML IV SOLN
INTRAVENOUS | Status: AC
  Filled 2016-10-18: qty 5

## 2016-10-18 MED ORDER — DEXAMETHASONE SODIUM PHOSPHATE 10 MG/ML IJ SOLN
INTRAMUSCULAR | Status: AC
  Filled 2016-10-18: qty 1

## 2016-10-18 MED ORDER — ACETAMINOPHEN 325 MG PO TABS: 650.0000 mg | ORAL_TABLET | Freq: Four times a day (QID) | ORAL | Status: DC | PRN

## 2016-10-18 MED ORDER — OXYCODONE HCL 5 MG/5ML PO SOLN: 5.0000 mg | Freq: Once | ORAL | Status: DC | PRN

## 2016-10-18 MED ORDER — LIDOCAINE HCL (CARDIAC) 20 MG/ML IV SOLN
INTRAVENOUS | Status: DC | PRN
  Administered 2016-10-18: 20 mg via INTRAVENOUS

## 2016-10-18 MED ORDER — LACTATED RINGERS IV SOLN
INTRAVENOUS | Status: DC
  Administered 2016-10-18: 13:00:00 via INTRAVENOUS

## 2016-10-18 MED ORDER — LIDOCAINE HCL 2 % IJ SOLN
INTRAMUSCULAR | Status: DC | PRN
  Administered 2016-10-18: 2 mL

## 2016-10-18 MED ORDER — DEXAMETHASONE SODIUM PHOSPHATE 10 MG/ML IJ SOLN
INTRAMUSCULAR | Status: DC | PRN
  Administered 2016-10-18: 5 mg via INTRAVENOUS

## 2016-10-18 MED ORDER — MEPERIDINE HCL 25 MG/ML IJ SOLN: 6.2500 mg | INTRAMUSCULAR | Status: DC | PRN

## 2016-10-18 MED ORDER — FENTANYL CITRATE (PF) 100 MCG/2ML IJ SOLN
INTRAMUSCULAR | Status: AC
  Filled 2016-10-18: qty 2

## 2016-10-18 MED ORDER — ONDANSETRON HCL 4 MG/2ML IJ SOLN
INTRAMUSCULAR | Status: DC | PRN
  Administered 2016-10-18: 4 mg via INTRAVENOUS

## 2016-10-18 MED ORDER — CEFAZOLIN SODIUM-DEXTROSE 2-4 GM/100ML-% IV SOLN
INTRAVENOUS | Status: AC
  Filled 2016-10-18: qty 100

## 2016-10-18 MED ORDER — IBUPROFEN 200 MG PO TABS: 600.0000 mg | ORAL_TABLET | Freq: Four times a day (QID) | ORAL | 0 refills | Status: DC | PRN

## 2016-10-18 MED ORDER — ONDANSETRON HCL 4 MG/2ML IJ SOLN
INTRAMUSCULAR | Status: AC
  Filled 2016-10-18: qty 2

## 2016-10-18 MED ORDER — FENTANYL CITRATE (PF) 100 MCG/2ML IJ SOLN
50.0000 ug | INTRAMUSCULAR | Status: DC | PRN
  Administered 2016-10-18: 50 ug via INTRAVENOUS

## 2016-10-18 MED ORDER — MIDAZOLAM HCL 2 MG/2ML IJ SOLN: 1.0000 mg | INTRAMUSCULAR | Status: DC | PRN

## 2016-10-18 MED ORDER — LACTATED RINGERS IV SOLN: INTRAVENOUS | Status: DC

## 2016-10-18 MED ORDER — SCOPOLAMINE 1 MG/3DAYS TD PT72: 1.0000 | MEDICATED_PATCH | Freq: Once | TRANSDERMAL | Status: DC | PRN

## 2016-10-18 MED ORDER — CEFAZOLIN SODIUM-DEXTROSE 2-4 GM/100ML-% IV SOLN
2.0000 g | INTRAVENOUS | Status: AC
  Administered 2016-10-18: 2 g via INTRAVENOUS

## 2016-10-18 MED ORDER — PROMETHAZINE HCL 25 MG/ML IJ SOLN: 6.2500 mg | INTRAMUSCULAR | Status: DC | PRN

## 2016-10-18 SURGICAL SUPPLY — 41 items
BLADE SURG 15 STRL LF DISP TIS (BLADE) ×1 IMPLANT
BLADE SURG 15 STRL SS (BLADE) ×3
BNDG CMPR 9X4 STRL LF SNTH (GAUZE/BANDAGES/DRESSINGS) ×1
BNDG COHESIVE 1X5 TAN STRL LF (GAUZE/BANDAGES/DRESSINGS) ×3 IMPLANT
BNDG CONFORM 2 STRL LF (GAUZE/BANDAGES/DRESSINGS) ×3 IMPLANT
BNDG ESMARK 4X9 LF (GAUZE/BANDAGES/DRESSINGS) ×3 IMPLANT
BNDG GAUZE ELAST 4 BULKY (GAUZE/BANDAGES/DRESSINGS) ×3 IMPLANT
CHLORAPREP W/TINT 26ML (MISCELLANEOUS) ×3 IMPLANT
COVER BACK TABLE 60X90IN (DRAPES) ×3 IMPLANT
COVER MAYO STAND STRL (DRAPES) ×3 IMPLANT
CUFF TOURNIQUET SINGLE 18IN (TOURNIQUET CUFF) ×2 IMPLANT
DRAPE EXTREMITY T 121X128X90 (DRAPE) ×3 IMPLANT
DRAPE SURG 17X23 STRL (DRAPES) ×3 IMPLANT
DRSG EMULSION OIL 3X3 NADH (GAUZE/BANDAGES/DRESSINGS) ×3 IMPLANT
GAUZE SPONGE 4X4 12PLY STRL LF (GAUZE/BANDAGES/DRESSINGS) ×3 IMPLANT
GLOVE BIO SURGEON STRL SZ7 (GLOVE) ×2 IMPLANT
GLOVE BIO SURGEON STRL SZ7.5 (GLOVE) ×3 IMPLANT
GLOVE BIOGEL PI IND STRL 7.0 (GLOVE) ×1 IMPLANT
GLOVE BIOGEL PI IND STRL 7.5 (GLOVE) IMPLANT
GLOVE BIOGEL PI IND STRL 8 (GLOVE) ×1 IMPLANT
GLOVE BIOGEL PI INDICATOR 7.0 (GLOVE) ×4
GLOVE BIOGEL PI INDICATOR 7.5 (GLOVE) ×2
GLOVE BIOGEL PI INDICATOR 8 (GLOVE) ×2
GLOVE ECLIPSE 6.5 STRL STRAW (GLOVE) ×3 IMPLANT
GOWN STRL REUS W/ TWL LRG LVL3 (GOWN DISPOSABLE) ×2 IMPLANT
GOWN STRL REUS W/TWL LRG LVL3 (GOWN DISPOSABLE) ×6
GOWN STRL REUS W/TWL XL LVL3 (GOWN DISPOSABLE) ×3 IMPLANT
NDL HYPO 25X1 1.5 SAFETY (NEEDLE) IMPLANT
NDL SAFETY ECLIPSE 18X1.5 (NEEDLE) IMPLANT
NEEDLE HYPO 18GX1.5 SHARP (NEEDLE)
NEEDLE HYPO 25X1 1.5 SAFETY (NEEDLE) ×3 IMPLANT
NS IRRIG 1000ML POUR BTL (IV SOLUTION) ×3 IMPLANT
PACK BASIN DAY SURGERY FS (CUSTOM PROCEDURE TRAY) ×3 IMPLANT
STOCKINETTE 6  STRL (DRAPES) ×2
STOCKINETTE 6 STRL (DRAPES) ×1 IMPLANT
SUT VICRYL RAPIDE 4/0 PS 2 (SUTURE) ×2 IMPLANT
SYR 10ML LL (SYRINGE) ×2 IMPLANT
SYR BULB 3OZ (MISCELLANEOUS) ×3 IMPLANT
TOWEL OR 17X24 6PK STRL BLUE (TOWEL DISPOSABLE) ×3 IMPLANT
TOWEL OR NON WOVEN STRL DISP B (DISPOSABLE) IMPLANT
UNDERPAD 30X30 (UNDERPADS AND DIAPERS) ×3 IMPLANT

## 2016-10-18 NOTE — Interval H&P Note (Signed)
History and Physical Interval Note:  10/18/2016 1:49 PM  Jeremy Black  has presented today for surgery, with the diagnosis of LEFT TRIGGER THUMB M65.312  The various methods of treatment have been discussed with the patient and family. After consideration of risks, benefits and other options for treatment, the patient has consented to  Procedure(s): LEFT TRIGGER THUMB RELEASE (Left) as a surgical intervention .  The patient's history has been reviewed, patient examined, no change in status, stable for surgery.  I have reviewed the patient's chart and labs.  Questions were answered to the patient's satisfaction.     Nahomi Hegner A.

## 2016-10-18 NOTE — Anesthesia Procedure Notes (Signed)
Procedure Name: MAC Performed by: Gwyn Hieronymus W Pre-anesthesia Checklist: Patient identified, Timeout performed, Emergency Drugs available, Suction available and Patient being monitored Oxygen Delivery Method: Simple face mask       

## 2016-10-18 NOTE — Transfer of Care (Signed)
Immediate Anesthesia Transfer of Care Note  Patient: Jeremy Black  Procedure(s) Performed: Procedure(s): LEFT TRIGGER THUMB RELEASE (Left)  Patient Location: PACU  Anesthesia Type:MAC  Level of Consciousness: awake, alert  and oriented  Airway & Oxygen Therapy: Patient Spontanous Breathing  Post-op Assessment: Report given to RN and Post -op Vital signs reviewed and stable  Post vital signs: Reviewed and stable  Last Vitals:  Vitals:   10/18/16 1302 10/18/16 1508  BP: 119/70   Pulse: 75   Resp: 20   Temp: 36.5 C (P) 36.6 C    Last Pain:  Vitals:   10/18/16 1302  TempSrc: Oral         Complications: No apparent anesthesia complications

## 2016-10-18 NOTE — Anesthesia Postprocedure Evaluation (Signed)
Anesthesia Post Note  Patient: Jeremy Black  Procedure(s) Performed: Procedure(s) (LRB): LEFT TRIGGER THUMB RELEASE (Left)     Patient location during evaluation: PACU Anesthesia Type: MAC Level of consciousness: awake and alert Pain management: pain level controlled Vital Signs Assessment: post-procedure vital signs reviewed and stable Respiratory status: spontaneous breathing Cardiovascular status: stable Anesthetic complications: no    Last Vitals:  Vitals:   10/18/16 1530 10/18/16 1549  BP: (!) 126/99 134/73  Pulse: (!) 56 (!) 58  Resp: (!) 9 16  Temp:  (!) 36.4 C    Last Pain:  Vitals:   10/18/16 1549  TempSrc: Oral  PainSc: 0-No pain                 Nolon Nations

## 2016-10-18 NOTE — Discharge Instructions (Signed)
Discharge Instructions   You have a light dressing on your hand.  You may begin gentle motion of your fingers and hand immediately, but you should not do any heavy lifting or gripping.  Elevate your hand to reduce pain & swelling of the digits.  Ice over the operative site may be helpful to reduce pain & swelling.  DO NOT USE HEAT. Pain medicine has been prescribed for you.  Take ibuprofen 600 mg and tylenol 650 mg OTC every 6 hours. Take the Oxycodone 5 mg as a rescue medicine for severe pain only. Leave the dressing in place until the third day after your surgery and then remove it, leaving it open to air.  After the bandage has been removed you may shower, regularly washing the incision and letting the water run over it, but not submerging it (no swimming, soaking it in dishwater, etc.) You may drive a car when you are off of prescription pain medications and can safely control your vehicle with both hands. We will address whether therapy will be required or not when you return to the office. You may have already made your follow-up appointment when we completed your preop visit.  If not, please call our office today or the next business day to make your return appointment for 10-15 days after surgery.   Please call [phone] during normal business hours or [phone] after hours for any problems. Including the following:  - excessive redness of the incisions - drainage for more than 4 days - fever of more than 101.5 F  *Please note that pain medications will not be refilled after hours or on weekends.   Post Anesthesia Home Care Instructions  Activity: Get plenty of rest for the remainder of the day. A responsible individual must stay with you for 24 hours following the procedure.  For the next 24 hours, DO NOT: -Drive a car -Paediatric nurse -Drink alcoholic beverages -Take any medication unless instructed by your physician -Make any legal decisions or sign important  papers.  Meals: Start with liquid foods such as gelatin or soup. Progress to regular foods as tolerated. Avoid greasy, spicy, heavy foods. If nausea and/or vomiting occur, drink only clear liquids until the nausea and/or vomiting subsides. Call your physician if vomiting continues.  Special Instructions/Symptoms: Your throat may feel dry or sore from the anesthesia or the breathing tube placed in your throat during surgery. If this causes discomfort, gargle with warm salt water. The discomfort should disappear within 24 hours.  If you had a scopolamine patch placed behind your ear for the management of post- operative nausea and/or vomiting:  1. The medication in the patch is effective for 72 hours, after which it should be removed.  Wrap patch in a tissue and discard in the trash. Wash hands thoroughly with soap and water. 2. You may remove the patch earlier than 72 hours if you experience unpleasant side effects which may include dry mouth, dizziness or visual disturbances. 3. Avoid touching the patch. Wash your hands with soap and water after contact with the patch.

## 2016-10-18 NOTE — Anesthesia Preprocedure Evaluation (Signed)
Anesthesia Evaluation  Patient identified by MRN, date of birth, ID band Patient awake    Reviewed: Allergy & Precautions, NPO status , Patient's Chart, lab work & pertinent test results  Airway Mallampati: II  TM Distance: >3 FB Neck ROM: Full    Dental  (+) Edentulous Upper, Edentulous Lower   Pulmonary neg pulmonary ROS, former smoker,    Pulmonary exam normal breath sounds clear to auscultation       Cardiovascular hypertension, Normal cardiovascular exam Rhythm:Regular Rate:Normal     Neuro/Psych PSYCHIATRIC DISORDERS negative neurological ROS     GI/Hepatic negative GI ROS, Neg liver ROS, GERD  ,  Endo/Other  negative endocrine ROSdiabetes  Renal/GU Renal diseasenegative Renal ROS     Musculoskeletal  (+) Arthritis ,   Abdominal   Peds  Hematology negative hematology ROS (+)   Anesthesia Other Findings   Reproductive/Obstetrics negative OB ROS                             Anesthesia Physical Anesthesia Plan  ASA: III  Anesthesia Plan: MAC   Post-op Pain Management:    Induction: Intravenous  PONV Risk Score and Plan: 2 and Ondansetron and Dexamethasone  Airway Management Planned:   Additional Equipment:   Intra-op Plan:   Post-operative Plan:   Informed Consent: I have reviewed the patients History and Physical, chart, labs and discussed the procedure including the risks, benefits and alternatives for the proposed anesthesia with the patient or authorized representative who has indicated his/her understanding and acceptance.   Dental advisory given  Plan Discussed with: CRNA  Anesthesia Plan Comments:         Anesthesia Quick Evaluation

## 2016-10-18 NOTE — Op Note (Signed)
10/18/2016  1:53 PM  PATIENT:  Jeremy Black  71 y.o. male  PRE-OPERATIVE DIAGNOSIS:  LEFT TRIGGER THUMB M65.312  POST-OPERATIVE DIAGNOSIS:  Same  PROCEDURE:  Procedure(s): LEFT TRIGGER THUMB RELEASE  SURGEON:  Surgeon(s): Milly Jakob, MD  PHYSICIAN ASSISTANT: Morley Kos, OPA-C  ANESTHESIA:  Local / MAC  SPECIMENS:  None  DRAINS:   None  EBL:  less than 50 mL  PREOPERATIVE INDICATIONS:  Jeremy Black is a  71 y.o. male with a diagnosis of LEFT TRIGGER THUMB M65.312 who failed conservative measures and elected for surgical management.    The risks benefits and alternatives were discussed with the patient preoperatively including but not limited to the risks of infection, bleeding, nerve injury, cardiopulmonary complications, the need for revision surgery, among others, and the patient verbalized understanding and consented to proceed.  OPERATIVE IMPLANTS: None  OPERATIVE FINDINGS: See Below  OPERATIVE PROCEDURE:  After receiving prophylactic antibiotics, the patient was escorted to the operative theatre and placed in a supine position.   A surgical "time-out" was performed during which the planned procedure, proposed operative site, and the correct patient identity were compared to the operative consent and agreement confirmed by the circulating nurse according to current facility policy.  Digital block was performed with a mixture of lidocaine and Marcaine, bearing epinephrine. Following application of a tourniquet to the operative extremity, the exposed skin was prepped with Chloraprep and draped in the usual sterile fashion.  The limb was exsanguinated with an Esmarch bandage and the tourniquet inflated to approximately 167mHg higher than systolic BP.  An transverse incision was made over the A1 pulley of the affected digit.  Subcutaneous taste tissues were dissected with blunt and spreading dissection to reveal an underlying flexor tendon sheath and A1 pulley.   With the neurovascular structures protected, the A1 pulley was split in the midline under direct visualization with loupe assistance.  Some crossing bands proximal to the A1 pulley were also released.  The tendons were pulled into view, cleaned of thickened synovium and return to their bed.   the tendon has some longitudinal split tearing that was debrided.  The wound was irrigated, tourniquet released, and skin closed with 4-0 Vicryl Rapide.  A light dressing was applied and the patient was taken to the recovery room.  DISPOSITION: The patient will be discharged home today with typical instructions, returning in 10-15 days.

## 2016-10-21 ENCOUNTER — Encounter (HOSPITAL_BASED_OUTPATIENT_CLINIC_OR_DEPARTMENT_OTHER): Payer: Self-pay | Admitting: Orthopedic Surgery

## 2016-11-20 ENCOUNTER — Telehealth: Payer: Self-pay | Admitting: Internal Medicine

## 2016-11-20 ENCOUNTER — Ambulatory Visit (INDEPENDENT_AMBULATORY_CARE_PROVIDER_SITE_OTHER): Payer: Medicare Other | Admitting: Internal Medicine

## 2016-11-20 VITALS — BP 130/70 | HR 64 | Temp 97.7°F | Wt 205.4 lb

## 2016-11-20 DIAGNOSIS — R809 Proteinuria, unspecified: Secondary | ICD-10-CM

## 2016-11-20 DIAGNOSIS — R0609 Other forms of dyspnea: Secondary | ICD-10-CM | POA: Diagnosis not present

## 2016-11-20 DIAGNOSIS — N184 Chronic kidney disease, stage 4 (severe): Secondary | ICD-10-CM

## 2016-11-20 DIAGNOSIS — E118 Type 2 diabetes mellitus with unspecified complications: Secondary | ICD-10-CM | POA: Diagnosis not present

## 2016-11-20 NOTE — Progress Notes (Signed)
No chief complaint on file.   HPI: Jeremy Black 71 y.o.  SDA  Visit  For ??  Last seen byme  12 2016 getting care from [place] and specialists    But did have his welelness visit    eval He has known ptsd renal insufficient   hypertesnions   Had recent finger surgery  And noted to have elevated cr at that time but unclear  At the time of this visit the electronic record was not available for hours. Whole system was down. He states that 9 health care nurse who comes for checkup told him and noticed that he was short of breath going up and down stairs told them that he should probably get an echocardiogram. Among other things He states he stopped the atorvastatin and the aspirin recently states that his blood pressure is controlled. His last A1c was 5.5. His primary care doctor is Lev[facility] Dy. [facility] who is managing his medicines as therapy and doing his labs. However when he called there today and got the operator he got no response about getting an appointment with her. He has an appointment with a new nephrologist in October his last nephrology appointment was in June He also sees a urologist to put him on Flomax. There is a remote history of him seeing Dr. Moshe Cipro in town for renal insufficiency and proteinuria unable to look at those records today. No chest pain edema change in urinary symptoms. He does feel tired at times he is now 100% disabled from the [place]. He is now on valsartan 160 mg a day and Flomax 0.4 mg a day      ROS: See pertinent positives and negatives per HPI. No bleeding  Falling neuo sx   Past Medical History:  Diagnosis Date  . Allergy   . Arthritis   . Cataract   . Diabetes mellitus without complication ([place])   . ED (erectile dysfunction)   . GERD (gastroesophageal reflux disease)   . Hyperlipidemia   . Hypertension   . Normal nuclear stress test    myoview neg 12 09 except for HT  . Other and unspecified alcohol dependence, unspecified drinking behavior    date of last use 02/13/11  . Proteinuria    past record showed 500mg  protein excretion per 24 hours eval by renal  . PTSD (post-traumatic stress disorder)    from [place] experience [place]  . [facility]    Family History  Problem Relation Age of Onset  . Diabetes Mother   . Hypertension Mother   . Other Mother        nerves  . Throat cancer Father        deceased  . Coronary artery disease Sister   . Diabetes Sister   . Heart disease Sister   . Hypertension Brother   . Alcohol abuse Paternal Aunt   . Alcohol abuse Cousin   . Drug abuse Son        cocaine dependent    Social History   Social History  . Marital status: Legally Separated    Spouse name: N/A  . Number of children: N/A  . 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 Smoking status: Former Smoker    Packs/day: 0.50    Years: 40.00    Types: Cigarettes    Quit date:  04/15/2009  . Smokeless tobacco: Never Used  . Alcohol use 30.0 oz/week    50 Shots of liquor per week     Comment: Pt in treatment for alcohol dependence. Last date of use 02/13/11.,  Very very little 01/27/13  . Drug use: No  . Sexual activity: Not Asked   Other Topics Concern  . 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   Working with disabled adults.    Plays golf weekly                 EXAM:  BP 130/70   Pulse 64   Temp 97.7 F (36.5 C)   Wt 205 lb 6.4 oz (93.2 kg)   BMI 29.47 kg/m   Body mass index is 29.47 kg/m.  GENERAL: vitals reviewed and listed above, alert, oriented, appears  well hydrated and in no acute distress HEENT: atraumatic, conjunctiva  clear, no obvious abnormalities on inspection of external nose and ears OP : no lesion edema or exudate  NECK: no obvious masses on inspection palpation  No jvd? LUNGS: clear to auscultation bilaterally, no wheezes, rales or rhonchi,  CV: HRRR, no clubbing cyanosis trc -1 peripheral edema nl cap refill  MS: moves all extremities without noticeable focal  abnormality PSYCH: pleasant and cooperative, no obvious depression or anxiety Lab Results  Component Value Date   WBC 6.1 09/12/2013   HGB 10.9 (L) 10/18/2016   HCT 32.0 (L) 10/18/2016   PLT 364 09/12/2013   GLUCOSE 94 10/18/2016   CHOL 147 04/20/2014   TRIG 129.0 04/20/2014   HDL 42.20 04/20/2014   LDLDIRECT 66.7 01/31/2011   LDLCALC 79 04/20/2014   ALT 15 09/17/2016   AST 16 09/17/2016   NA 140 10/18/2016   K 3.8 10/18/2016   CL 110 10/18/2016   CREATININE 4.10 (H) 10/18/2016   BUN 32 (H) 10/18/2016   CO2 24 04/20/2014   TSH 0.76 07/09/2012   PSA 0.82 09/07/2009   HGBA1C 6.7 (H) 04/20/2014   MICROALBUR 141.5 (H) 09/14/2009   Above labs were abstracted from  What we have from the [place]:  Discussed the following assessment and plan:  DOE (dyspnea on exertion)  Chronic renal disease, stage IV ([place]) - ? dont have info on progression   Proteinuria, unspecified type  DM type 2, controlled, with complication ([place])  Difficult situation with primary care at the [facility] and we don't have the records and sent in by [facility] nurse   PCP care from the [facility]  And we only see him as" a back up   "  Are his words    Although have some labs do not have continuity of care plan    Here today as acute visit sent in by [facility]   nurse  To ? Sort itit out ?  He has elevated creatinine   And shoud be followed by nephrology (  Has seen one in past but I am not priovy to that inforamtion) I am more concerned as if there is more  acute findings. His creatinine  was 4 in July. His insidious onset of shortness of breath should have more evaluation I agree but this should be done by his primary clinician at the [place]. He will contact again if not getting an appointment contact us either way and we can get a cardiology appointment I am not sureif he should wait until the fall to get the next nephrology appointment if his creatinine is this high.  I dont know the baseline or progression at this time  Cannot tell how  acute .   -Patient advised to return or notify health care team  if symptoms worsen ,persist or new concerns arise.  Patient Instructions  Hand written instructions   EHR was down this afternoon     Mariann Laster K. Panosh M.D.

## 2016-11-20 NOTE — Telephone Encounter (Signed)
Miriana with [employer] visited pt in his home and did a urine dipstick which results were  3+ protein

## 2016-11-20 NOTE — Telephone Encounter (Signed)
fyi

## 2016-11-21 ENCOUNTER — Encounter: Payer: Self-pay | Admitting: Internal Medicine

## 2016-11-21 NOTE — Patient Instructions (Addendum)
Hand written instructions   EHR was down this afternoon

## 2016-11-22 ENCOUNTER — Other Ambulatory Visit: Payer: Self-pay | Admitting: Emergency Medicine

## 2016-11-22 ENCOUNTER — Telehealth: Payer: Self-pay | Admitting: Emergency Medicine

## 2016-11-22 DIAGNOSIS — I10 Essential (primary) hypertension: Secondary | ICD-10-CM

## 2016-11-22 DIAGNOSIS — R0609 Other forms of dyspnea: Principal | ICD-10-CM

## 2016-11-22 NOTE — Telephone Encounter (Signed)
-----   Message from [name], MD sent at 11/22/2016  4:21 PM EDT ----- Please contact patient  [facility]? If now we may need to refer him to cardiology .  This visit happened when the EHR was down and he was supposed to contact us today

## 2016-11-22 NOTE — Telephone Encounter (Signed)
Spoke with patient and he stated that he could not [facility]ment with [facility] because their was no answer. Patient agreed to go through with cardiology referral. Referral has been sent in

## 2016-12-11 ENCOUNTER — Encounter: Payer: Self-pay | Admitting: Internal Medicine

## 2016-12-18 ENCOUNTER — Ambulatory Visit (INDEPENDENT_AMBULATORY_CARE_PROVIDER_SITE_OTHER): Payer: Medicare Other | Admitting: Internal Medicine

## 2016-12-18 ENCOUNTER — Encounter: Payer: Self-pay | Admitting: Internal Medicine

## 2016-12-18 ENCOUNTER — Ambulatory Visit (INDEPENDENT_AMBULATORY_CARE_PROVIDER_SITE_OTHER)
Admission: RE | Admit: 2016-12-18 | Discharge: 2016-12-18 | Disposition: A | Discharge status: Home / Self Care | Payer: Medicare Other | Source: Ambulatory Visit | Attending: Internal Medicine | Admitting: Internal Medicine

## 2016-12-18 VITALS — BP 126/66 | HR 63 | Temp 98.3°F | Ht 70.0 in | Wt 203.6 lb

## 2016-12-18 DIAGNOSIS — R109 Unspecified abdominal pain: Secondary | ICD-10-CM | POA: Diagnosis not present

## 2016-12-18 DIAGNOSIS — N184 Chronic kidney disease, stage 4 (severe): Secondary | ICD-10-CM | POA: Diagnosis not present

## 2016-12-18 DIAGNOSIS — R448 Other symptoms and signs involving general sensations and perceptions: Secondary | ICD-10-CM

## 2016-12-18 DIAGNOSIS — R6889 Other general symptoms and signs: Secondary | ICD-10-CM

## 2016-12-18 LAB — BASIC METABOLIC PANEL
BUN: 32 mg/dL — AB (ref 6–23)
CHLORIDE: 107 meq/L (ref 96–112)
CO2: 23 mEq/L (ref 19–32)
CREATININE: 3.95 mg/dL — AB (ref 0.40–1.50)
Calcium: 10 mg/dL (ref 8.4–10.5)
GFR: 19.39 mL/min — ABNORMAL LOW (ref >60.00)
GLUCOSE: 139 mg/dL — AB (ref 70–99)
Potassium: 5 mEq/L (ref 3.5–5.1)
Sodium: 141 mEq/L (ref 135–145)

## 2016-12-18 LAB — POC URINALSYSI DIPSTICK (AUTOMATED)
BILIRUBIN UA: NEGATIVE
Blood, UA: NEGATIVE
GLUCOSE UA: NEGATIVE
KETONES UA: NEGATIVE
Leukocytes, UA: NEGATIVE
Nitrite, UA: NEGATIVE
SPEC GRAV UA: 1.015 (ref 1.010–1.025)
Urobilinogen, UA: 0.2 E.U./dL
pH, UA: 6 (ref 5.0–8.0)

## 2016-12-18 LAB — CBC WITH DIFFERENTIAL/PLATELET
BASOS ABS: 0.1 10*3/uL (ref 0.0–0.1)
Basophils Relative: 0.8 % (ref 0.0–3.0)
EOS PCT: 2 % (ref 0.0–5.0)
Eosinophils Absolute: 0.1 10*3/uL (ref 0.0–0.7)
HEMATOCRIT: 33.4 % — AB (ref 39.0–52.0)
HEMOGLOBIN: 10.7 g/dL — AB (ref 13.0–17.0)
LYMPHS ABS: 1.4 10*3/uL (ref 0.7–4.0)
Lymphocytes Relative: 21.2 % (ref 12.0–46.0)
MCHC: 32.1 g/dL (ref 30.0–36.0)
MCV: 82 fl (ref 78.0–100.0)
MONOS PCT: 8.3 % (ref 3.0–12.0)
Monocytes Absolute: 0.5 10*3/uL (ref 0.1–1.0)
Neutro Abs: 4.3 10*3/uL (ref 1.4–7.7)
Neutrophils Relative %: 67.7 % (ref 43.0–77.0)
Platelets: 373 10*3/uL (ref 150.0–400.0)
RBC: 4.07 Mil/uL — AB (ref 4.22–5.81)
RDW: 16.3 % — ABNORMAL HIGH (ref 11.5–15.5)
WBC: 6.4 10*3/uL (ref 4.0–10.5)

## 2016-12-18 NOTE — Patient Instructions (Signed)
  Getting recheck  Of your kidney function blood work and urinalysis. We are ordering a CT scan without contrast of your kidneys to check for renal stones. It is possible he could be a muscle spasm problem although you've never had this problem before,    Do not take products such as ibuprofen and Aleve at this time because it can interfere with your kidney function. You can take Tylenol and we can decide on other medications if needed. If your pain is getting severe worse with fever you may need to seek care in the emergency room for further evaluation.

## 2016-12-18 NOTE — Progress Notes (Signed)
Chief Complaint  Patient presents with  . Acute Visit    Pt states he has been having some left sided back pain that extends down to his thigh, Denies dysuria, hematuria    HPI: Jeremy SHELNUTT 71 y.o.  SDA   Gets  primary care and specialty care  care at the [facility] and we do not have access top those records  Onset a few days ago  Of left back flank pain ache quality  And coming and going.  Left flank pain that radiates to left leg anterior  Uncertain what makes  Worse or better.  Pain 7/10  No hx of same   ( remote hx of back surgery but this is not midline and different  Aching pain.  Makes get up at night.  Did see urology 8 27   For elevated psa and  Supposed to have bx for elevated PSA on sept 21? ? No pain at that time  Had appt with cardiology 9 10 about the  Progressive doe  See last note   ROS: See pertinent positives and negatives per HPI. Sob about the same no cp vomiting fever or gross hematuria   Clod all the time  In the past year and has no sig appetite .   Past Medical History:  Diagnosis Date  . Allergy   . Arthritis   . Cataract   . Diabetes mellitus without complication ([place])   . ED (erectile dysfunction)   . GERD (gastroesophageal reflux disease)   . Hyperlipidemia   . Hypertension   . Normal nuclear stress test    myoview neg 12 09 except for HT  . Other and unspecified alcohol dependence, unspecified drinking behavior    date of last use 02/13/11  . Proteinuria    past record showed 500mg  protein excretion per 24 hours eval by renal  . PTSD (post-traumatic stress disorder)    from [place] experience [place]  . S[facility]    Family History  Problem Relation Age of Onset  . Diabetes Mother   . Hypertension Mother   . Other Mother        nerves  . Throat cancer Father        deceased  . Coronary artery disease Sister   . Diabetes Sister   . Heart disease Sister   . Hypertension Brother   . Alcohol abuse Paternal  Aunt   . Alcohol abuse Cousin   . Drug abuse Son        cocaine dependent    Social History   Social History  . Marital status: Legally Separated    Spouse name: N/A  . Number of children: N/A  . 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 Smoking status: Former Smoker    Packs/day: 0.50    Years: 40.00    Types: Cigarettes    Quit date: 04/15/2009  . Smokeless tobacco: Never Used  . Alcohol use 30.0 oz/week    50 Shots of liquor per week     Comment: Pt in treatment for alcohol dependence. Last date of use 02/13/11.,  Very very little 01/27/13  . Drug use: No  . Sexual activity: Not Asked   Other Topics Concern  . 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   Working with disabled adults.    Plays golf weekly              Outpatient Medications Prior to Visit  Medication Sig Dispense Refill  . allopurinol (ZYLOPRIM) 100 MG tablet Take 100 mg by mouth daily.    Marland Kitchen amLODipine (NORVASC) 10 MG tablet Take 1 tablet (10 mg total) by mouth daily. 90 tablet 3  . hydrALAZINE (APRESOLINE) 50 MG tablet Take 50 mg by mouth 3 (three) times daily.    Marland Kitchen ibuprofen (ADVIL) 200 MG tablet Take 3 tablets (600 mg total) by mouth every 6 (six) hours as needed for moderate pain.  0  . montelukast (SINGULAIR) 10 MG tablet Take 10 mg by mouth at bedtime.    Marland Kitchen omeprazole (PRILOSEC) 20 MG capsule Take 20 mg by mouth daily. From the [place]    . oxyCODONE (ROXICODONE) 5 MG immediate release tablet Take 1 tablet (5 mg total) by mouth every 6 (six) hours as needed for breakthrough pain. 15 tablet 0  .  traZODone (DESYREL) 50 MG tablet Take 50 mg by mouth at bedtime as needed for sleep.    Marland Kitchen triamterene-hydrochlorothiazide (MAXZIDE-25) 37.5-25 MG per tablet Take 1 tablet by mouth  daily 90 tablet 2  . acetaminophen (TYLENOL) 325 MG tablet Take 2 tablets (650 mg total) by mouth every 6 (six) hours as needed for mild pain or moderate pain. (Patient not taking: Reported on 12/18/2016)    . valACYclovir (VALTREX) 1000 MG tablet TAKE 1 TABLET (1,000 MG TOTAL) BY MOUTH DAILY. OR AS DIRECTED (Patient not taking: Reported on 12/18/2016) 30 tablet 5   No facility-administered medications prior to visit.     Med list is not accuratea and will be updated   Pt to bring back  Accurate list  He has had flomaxz EXAM:  BP 126/66 (BP Location: Right Arm, Patient Position: Sitting, Cuff Size: Normal)   Pulse 63   Temp 98.3 F (36.8 C) (Oral)   Ht 5\' 10"  (1.778 m)   Wt 203 lb 9.6 oz (92.4 kg)   SpO2 98%   BMI 29.21 kg/m   Body mass index is 29.21 kg/m.  GENERAL: vitals reviewed and listed above, alert, oriented, appears well hydrated and in no acute distress uncomfortable but   Non toxic  HEENT: atraumatic, conjunctiva  clear, no obvious abnormalities on inspection of external nose and ears   NECK: no obvious masses on inspection palpation  LUNGS: clear to auscultation bilaterally, no wheezes, rales or rhonchi, good air movement n mid line tenderness    Left flank tender   abd soft no masses  Hip rom nl and no pain with this .  Gait steady  CV: HRRR, no clubbing cyanosis or  peripheral edema nl cap refill  MS: moves all extremities without noticeable focal  abnormality  pleasant and cooperative, no obvious depression or anxiety Lab Results  Component Value Date   WBC 6.1 09/12/2013   HGB 10.9 (L) 10/18/2016   HCT 32.0 (L) 10/18/2016   PLT 364 09/12/2013   GLUCOSE 94 10/18/2016   CHOL 147 04/20/2014   TRIG  129.0 04/20/2014   HDL 42.20 04/20/2014   LDLDIRECT 66.7 01/31/2011   LDLCALC 79 04/20/2014    ALT 15 09/17/2016   AST 16 09/17/2016   NA 140 10/18/2016   K 3.8 10/18/2016   CL 110 10/18/2016   CREATININE 4.10 (H) 10/18/2016   BUN 32 (H) 10/18/2016   CO2 24 04/20/2014   TSH 0.76 07/09/2012   PSA 0.82 09/07/2009   HGBA1C 6.7 (H) 04/20/2014   MICROALBUR 141.5 (H) 09/14/2009   Wt Readings from Last 3 Encounters:  12/18/16 203 lb 9.6 oz (92.4 kg)  11/21/16 205 lb 6.4 oz (93.2 kg)  10/18/16 200 lb 4 oz (90.8 kg)   BP Readings from Last 3 Encounters:  12/18/16 126/66  11/21/16 130/70  10/18/16 134/73    ASSESSMENT AND PLAN:  Discussed the following assessment and plan:  Left flank pain - new onset concern about stone of kidney problem  - Plan: Basic metabolic panel, POCT Urinalysis Dipstick (Automated), CBC with Differential/Platelet, CT RENAL STONE STUDY, CANCELED: CBC with Differential/Platelet  Chronic renal disease, stage IV (HCC) - Plan: Basic metabolic panel, POCT Urinalysis Dipstick (Automated), CBC with Differential/Platelet, CT RENAL STONE STUDY, CANCELED: CBC with Differential/Platelet  Feels cold  Abdominal pain, unspecified abdominal location - Plan: CT RENAL STONE STUDY Gets  primary care and specialty care  care at the [facility] and we do not have access top those recrods   Uses our  practice  When he cant get access to the [place] system . As needed.  Makes evaluation problematic   And prob with contiuity of care  he has renal failure  Level  Creatine  In July but doesn't have nephrology appt until [place] via [place].   . Will proceed with renal imagine no contrast and  Avoid all nsaids  For now and  Go from there.   Pain could be mechanical bu atypical for this    -Patient advised to return or notify health care team  if symptoms worsen ,persist or new concerns arise.  Patient Instructions   Getting recheck  Of your kidney function blood work and urinalysis. We are ordering a CT scan without contrast of your kidneys to check for renal stones. It is possible he could be  a muscle spasm problem although you've never had this problem before,    Do not take products such as ibuprofen and Aleve at this time because it can interfere with your kidney function. You can take Tylenol and we can decide on other medications if needed. If your pain is getting severe worse with fever you may need to seek care in the emergency room for further evaluation.    Standley Brooking. Cabria Micalizzi M.D.

## 2016-12-18 NOTE — Progress Notes (Signed)
Cardiology Office Note   Date:  12/23/2016   ID:  Jeremy Black, DOB April 18, 1945, MRN [id number]  PCP:  [name], MD  Cardiologist:   Jenkins Rouge, MD   No chief complaint on file.     History of Present Illness: Jeremy Black is a 71 y.o. male who presents for evaluation of HTN and dyspnea on exertion Referred by Dr Regis Bill Last seen by me 2014 bradycardia resolved off beta blocker ARB used to Rx BP On statin for cholesterol  Echo 07/02/12 reviewed and EF 60-65% moderate LVH grade one diastolic dysfunction Mild LAE. Seen by Dr Debbora Lacrosse 11/20/16 after home health nurse noted exertional dyspnea going up/down stairs. Stopped statin and ASA recently. Has CRF sees Nephrologist and urologist on flomax. No chest pain edema change in urinary symptoms. He does feel tired at times he is now 100% disabled from the [place] in [place] 10/18/16 4.1 BUN 32 K 3.8   He has no chest pain has started exercising last week or so and doing better. [facility] get [facility]  Past Medical History:  Diagnosis Date  . Allergy   . Arthritis   . Cataract   . Diabetes mellitus without complication ([place])   . ED (erectile dysfunction)   . GERD (gastroesophageal reflux disease)   . Hyperlipidemia   . Hypertension   . Normal nuclear stress test    myoview neg 12 09 except for HT  . Other and unspecified alcohol dependence, unspecified drinking behavior    date of last use 02/13/11  . Proteinuria    past record showed 500mg  protein excretion per 24 hours eval by renal  . PTSD (post-traumatic stress disorder)    from [place] experience [place]  . [facility]    [facility]    Past Surgical History:  Procedure Laterality Date  . ls spinal surgery    . NM MYOVIEW LTD  12/09   neg except for HT  . TRIGGER FINGER RELEASE Left 10/18/2016   Procedure: LEFT TRIGGER THUMB RELEASE;  Surgeon: Milly Jakob, MD;  Location: [facility];  Service: Orthopedics;  Laterality: Left;     Current Outpatient Prescriptions  Medication Sig Dispense Refill  . acetaminophen (TYLENOL) 325 MG tablet Take 2 tablets (650 mg total) by mouth every 6 (six) hours as needed for mild pain or moderate pain.    Marland Kitchen allopurinol (ZYLOPRIM) 100 MG tablet Take 100 mg by mouth daily.    Marland Kitchen amLODipine (NORVASC) 10 MG tablet Take 1 tablet (10 mg total) by mouth daily. 90 tablet 3  . aspirin 81 MG chewable tablet Chew 81 mg by mouth daily.    Marland Kitchen atorvastatin (LIPITOR) 20 MG tablet Take 20 mg by mouth daily.    . ferrous sulfate 325 (65 FE) MG tablet Take 325 mg by mouth 2 (two) times daily.    . hydrALAZINE (APRESOLINE) 50 MG tablet Take 50 mg by mouth daily.    Marland Kitchen ibuprofen (ADVIL) 200 MG tablet Take 3 tablets (600 mg total) by mouth every 6 (six) hours as needed for moderate pain.  0  . methylPREDNISolone (MEDROL) 4 MG tablet Take 4 mg by mouth as directed.    . montelukast (SINGULAIR) 10 MG tablet Take 10 mg by mouth at bedtime.    Marland Kitchen omeprazole (PRILOSEC) 20 MG capsule Take 20 mg by mouth daily. From the [place]    .  oxyCODONE (ROXICODONE) 5 MG immediate release tablet Take 1 tablet (5 mg total) by mouth every 6 (six) hours as needed for breakthrough pain. 15 tablet 0  . traZODone (DESYREL) 50 MG tablet Take 50 mg by mouth at bedtime as needed for sleep.    Marland Kitchen triamterene-hydrochlorothiazide (MAXZIDE-25) 37.5-25 MG per tablet Take 1 tablet by mouth  daily 90 tablet 2  . valACYclovir (VALTREX) 1000 MG tablet TAKE 1 TABLET (1,000 MG TOTAL) BY MOUTH DAILY. OR AS DIRECTED 30 tablet 5  . valsartan (DIOVAN) 160 MG tablet Take 160 mg by mouth daily.     No current facility-administered medications for this visit.     Allergies:   Codeine phosphate and Lisinopril    Social History:  The patient  reports that he quit smoking about 7 years ago. His smoking use included Cigarettes. He has a 20.00 pack-year smoking history. He has never used smokeless  tobacco. He reports that he drinks about 30.0 oz of alcohol per week . He reports that he does not use drugs.   Family History:  The patient's family history includes Alcohol abuse in his cousin and paternal aunt; Coronary artery disease in his sister; Diabetes in his mother and sister; Drug abuse in his son; Heart disease in his sister; Hypertension in his brother and mother; Other in his mother; Throat cancer in his father.    ROS:  Please see the history of present illness.   Otherwise, review of systems are positive for none.   All other systems are reviewed and negative.    PHYSICAL EXAM: VS:  BP 140/80   Pulse 61   Ht 5\' 10"  (1.778 m)   Wt 208 lb 12.8 oz (94.7 kg)   SpO2 97%   BMI 29.96 kg/m  , BMI Body mass index is 29.96 kg/m. Affect appropriate Healthy:  appears stated age 74: normal Neck supple with no adenopathy JVP normal no bruits no thyromegaly Lungs clear with no wheezing and good diaphragmatic motion Heart:  S1/S2 no murmur, no rub, gallop or click PMI normal Abdomen: benighn, BS positve, no tenderness, no AAA no bruit.  No HSM or HJR Distal pulses intact with no bruits No edema Neuro non-focal Skin warm and dry No muscular weakness    EKG:  10/18/16 SR rate 56 normal    Recent Labs: 09/17/2016: ALT 15 12/18/2016: BUN 32; Creatinine, Ser 3.95; Hemoglobin 10.7; Platelets 373.0; Potassium 5.0; Sodium 141    Lipid Panel    Component Value Date/Time   CHOL 147 04/20/2014 1119   TRIG 129.0 04/20/2014 1119   HDL 42.20 04/20/2014 1119   CHOLHDL 3 04/20/2014 1119   VLDL 25.8 04/20/2014 1119   LDLCALC 79 04/20/2014 1119   LDLDIRECT 66.7 01/31/2011 0938      Wt Readings from Last 3 Encounters:  12/23/16 208 lb 12.8 oz (94.7 kg)  12/18/16 203 lb 9.6 oz (92.4 kg)  11/21/16 205 lb 6.4 oz (93.2 kg)      Other studies Reviewed: Additional studies/ records that were reviewed today include: Cardiology notes echo 2014 Dr Regis Bill office notes June/August Labs  and ECG .    ASSESSMENT AND PLAN:  1.  Dyspnea seems functional f/u echo suspect grade 2 diastolic dysfunction due to renal disease 2. HTN  Well controlled.  Continue current medications and low sodium Dash type diet.   3. CRF needs referral back to [facility] discussed future need dialysis  4. Cholesterol  Continue statin    Current medicines are  reviewed at length with the patient today.  The patient does not have concerns regarding medicines.  The following changes have been made:  no change  Labs/ tests ordered today include: Echo   Orders Placed This Encounter  Procedures  . ECHOCARDIOGRAM COMPLETE     Disposition:   FU with cardiology PRN      Signed, Jenkins Rouge, MD  12/23/2016 8:40 AM    [facility] [address], [place], [place]  [address] Phone: [phone]; Fax: [phone]

## 2016-12-19 ENCOUNTER — Inpatient Hospital Stay: Admission: RE | Admit: 2016-12-19 | Payer: Medicare Other | Source: Ambulatory Visit

## 2016-12-23 ENCOUNTER — Encounter: Payer: Self-pay | Admitting: Cardiovascular Disease

## 2016-12-23 ENCOUNTER — Ambulatory Visit (INDEPENDENT_AMBULATORY_CARE_PROVIDER_SITE_OTHER): Payer: Medicare Other | Admitting: Cardiovascular Disease

## 2016-12-23 VITALS — BP 140/80 | HR 61 | Ht 70.0 in | Wt 208.8 lb

## 2016-12-23 DIAGNOSIS — I10 Essential (primary) hypertension: Secondary | ICD-10-CM | POA: Diagnosis not present

## 2016-12-23 DIAGNOSIS — R0609 Other forms of dyspnea: Secondary | ICD-10-CM

## 2016-12-23 NOTE — Patient Instructions (Addendum)

## 2016-12-26 ENCOUNTER — Encounter: Payer: Self-pay | Admitting: Internal Medicine

## 2016-12-27 ENCOUNTER — Other Ambulatory Visit: Payer: Self-pay

## 2016-12-27 ENCOUNTER — Ambulatory Visit (HOSPITAL_COMMUNITY): Payer: Medicare Other | Attending: Cardiovascular Disease

## 2016-12-27 DIAGNOSIS — R0609 Other forms of dyspnea: Secondary | ICD-10-CM | POA: Diagnosis not present

## 2016-12-27 DIAGNOSIS — Z87891 Personal history of nicotine dependence: Secondary | ICD-10-CM | POA: Diagnosis not present

## 2016-12-27 DIAGNOSIS — I10 Essential (primary) hypertension: Secondary | ICD-10-CM | POA: Diagnosis not present

## 2016-12-27 DIAGNOSIS — E785 Hyperlipidemia, unspecified: Secondary | ICD-10-CM | POA: Diagnosis not present

## 2016-12-27 DIAGNOSIS — E669 Obesity, unspecified: Secondary | ICD-10-CM | POA: Insufficient documentation

## 2017-01-03 ENCOUNTER — Encounter: Payer: Self-pay | Admitting: Internal Medicine

## 2017-01-13 ENCOUNTER — Encounter: Payer: Self-pay | Admitting: Internal Medicine

## 2017-01-13 ENCOUNTER — Ambulatory Visit (INDEPENDENT_AMBULATORY_CARE_PROVIDER_SITE_OTHER)
Admission: RE | Admit: 2017-01-13 | Discharge: 2017-01-13 | Disposition: A | Discharge status: Home / Self Care | Payer: Medicare Other | Source: Ambulatory Visit | Attending: Internal Medicine | Admitting: Internal Medicine

## 2017-01-13 ENCOUNTER — Ambulatory Visit (INDEPENDENT_AMBULATORY_CARE_PROVIDER_SITE_OTHER): Payer: Medicare Other | Admitting: Internal Medicine

## 2017-01-13 VITALS — BP 136/70 | HR 70 | Temp 97.6°F | Ht 70.0 in | Wt 217.2 lb

## 2017-01-13 DIAGNOSIS — Z23 Encounter for immunization: Secondary | ICD-10-CM

## 2017-01-13 DIAGNOSIS — M79675 Pain in left toe(s): Secondary | ICD-10-CM

## 2017-01-13 DIAGNOSIS — M7989 Other specified soft tissue disorders: Secondary | ICD-10-CM | POA: Diagnosis not present

## 2017-01-13 DIAGNOSIS — S99922A Unspecified injury of left foot, initial encounter: Secondary | ICD-10-CM

## 2017-01-13 DIAGNOSIS — E118 Type 2 diabetes mellitus with unspecified complications: Secondary | ICD-10-CM

## 2017-01-13 NOTE — Patient Instructions (Addendum)
Get the x-ray today at the [facility] radiology. Dept.   This may be a strong contusion but see if could have a crack in the bone near the nail.   Sometimes we use a stiff shoe and  Boot  To fu . And see ortho or sports ts medicine

## 2017-01-13 NOTE — Progress Notes (Signed)
Chief Complaint  Patient presents with  . Acute Visit    Pt dropped something on his big toe on his left foot last week, pt is c/o pain and swelling and some discoloration     HPI: Jeremy Black 71 y.o. come in for new problem 5 days ago he was deer hunting pulling a heavy object out of the car it fell down about 3 feet and bounced onto his left great toe he had a shoe that was like a sneaker. He had some pain but continued with his activity and you're hunted that day. The optic was about 25 pounds. Since that time he has been walking but the tip of his toe is tender he does have some neuropathy symptoms doesn't feel everything but has used ice baths since last time.  He is under plan evaluation for memory issues and his proteinuria and kidney disease.This is unrelated to his foot.   : See pertinent positives and negatives per HPI.  Past Medical History:  Diagnosis Date  . Allergy   . Arthritis   . Cataract   . Diabetes mellitus without complication ([place])   . ED (erectile dysfunction)   . GERD (gastroesophageal reflux disease)   . Hyperlipidemia   . Hypertension   . Normal nuclear stress test    myoview neg 12 09 except for HT  . Other and unspecified alcohol dependence, unspecified drinking behavior    date of last use 02/13/11  . Proteinuria    past record showed 500mg  protein excretion per 24 hours eval by renal  . PTSD (post-traumatic stress disorder)    from [place] experience [place]  . [facility]    Family History  Problem Relation Age of Onset  . Diabetes Mother   . Hypertension Mother   . Other Mother        nerves  . Throat cancer Father        deceased  . Coronary artery disease Sister   . Diabetes Sister   . Heart disease Sister   . Hypertension Brother   . Alcohol abuse Paternal Aunt   . Alcohol abuse Cousin   . Drug abuse Son        cocaine dependent    Social History   Social History  . Marital status:  Legally Separated    Spouse name: N/A  . Number of children: N/A  . 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 Smoking status: Former Smoker    Packs/day: 0.50    Years: 40.00    Types: Cigarettes    Quit date: 04/15/2009  . Smokeless tobacco: Never Used  . Alcohol use 30.0 oz/week    50 Shots of liquor per week     Comment: Pt in treatment for alcohol dependence. Last date of use 02/13/11.,  Very very little 01/27/13  . Drug use: No  . Sexual activity: Not Asked   Other Topics Concern  . 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   Working with disabled adults.    Plays golf weekly              Outpatient Medications Prior to Visit  Medication Sig Dispense Refill  . acetaminophen (TYLENOL) 325 MG tablet Take 2 tablets (650 mg total) by mouth every 6 (six) hours as needed for mild pain or moderate pain.    Marland Kitchen allopurinol (ZYLOPRIM) 100 MG tablet Take 100 mg by mouth daily.    Marland Kitchen amLODipine (NORVASC) 10 MG tablet Take 1 tablet (10 mg total) by mouth daily. 90 tablet 3  . aspirin 81 MG chewable tablet Chew 81 mg by mouth daily.    Marland Kitchen atorvastatin (LIPITOR) 20 MG tablet Take 20 mg by mouth daily.    . hydrALAZINE (APRESOLINE) 50 MG tablet Take 50 mg by mouth daily.    Marland Kitchen ibuprofen (ADVIL) 200 MG tablet Take 3 tablets (600 mg total) by mouth every 6 (six) hours as needed for moderate pain.  0  . montelukast (SINGULAIR) 10 MG tablet Take 10 mg by mouth at bedtime.    Marland Kitchen omeprazole (PRILOSEC) 20 MG capsule Take 20 mg by mouth daily. From the [place]    . oxyCODONE (ROXICODONE) 5 MG immediate  release tablet Take 1 tablet (5 mg total) by mouth every 6 (six) hours as needed for breakthrough pain. 15 tablet 0  . tamsulosin (FLOMAX) 0.4 MG CAPS capsule Take 0.4 mg by mouth.    . traZODone (DESYREL) 50 MG tablet Take 50 mg by mouth at bedtime as needed for sleep.    Marland Kitchen triamterene-hydrochlorothiazide (MAXZIDE-25) 37.5-25 MG per tablet Take 1 tablet by mouth  daily 90 tablet 2  . valACYclovir (VALTREX) 1000 MG tablet TAKE 1 TABLET (1,000 MG TOTAL) BY MOUTH DAILY. OR AS DIRECTED 30 tablet 5  . valsartan (DIOVAN) 160 MG tablet Take 160 mg by mouth daily.     No facility-administered medications prior to visit.      EXAM:  BP 136/70 (BP Location: Right Arm, Patient Position: Sitting, Cuff Size: Normal)   Pulse 70   Temp 97.6 F (36.4 C) (Oral)   Ht 5\' 10"  (1.778 m)   Wt 217 lb 3.2 oz (98.5 kg)   SpO2 98%   BMI 31.16 kg/m   Body mass index is 31.16 kg/m.  GENERAL: vitals reviewed and listed above, alert, oriented, appears well hydrated and in no acute distress HEENT: atraumatic, conjunctiva  clear, no obvious abnormalities on inspection of external nose and earss Examination of his left foot shows +2 swelling of the great toe on the soft tissue and underneath MTP is not tender he has good range of motion of that area. No foreign body or abrasion ulcer. Skin appears to be intact. Minimal discoloration under the nail and appears to have normal capillary refill.  PSYCH: pleasant and cooperative, no obvious depression or anxiety Lab Results  Component Value Date   WBC 6.4 12/18/2016   HGB 10.7 (L) 12/18/2016   HCT 33.4 (L) 12/18/2016   PLT 373.0 12/18/2016   GLUCOSE 139 (H) 12/18/2016   CHOL 147 04/20/2014   TRIG 129.0 04/20/2014   HDL 42.20 04/20/2014   LDLDIRECT 66.7 01/31/2011   LDLCALC 79 04/20/2014   ALT 15 09/17/2016   AST 16 09/17/2016   NA 141 12/18/2016   K 5.0 12/18/2016   CL 107 12/18/2016   CREATININE 3.95 (H) 12/18/2016   BUN 32 (H) 12/18/2016  CO2 23  12/18/2016   TSH 0.76 07/09/2012   PSA 0.82 09/07/2009   HGBA1C 6.7 (H) 04/20/2014   MICROALBUR 141.5 (H) 09/14/2009   BP Readings from Last 3 Encounters:  01/13/17 136/70  12/23/16 140/80  12/18/16 126/66    ASSESSMENT AND PLAN:  Discussed the following assessment and plan:  Toe injury, left, initial encounter - Plan: DG Toe Great Left, DG Foot Complete Left  Pain and swelling of toe of left foot - Plan: DG Toe Great Left, DG Foot Complete Left  Need for prophylactic vaccination and inoculation against influenza - Plan: Flu vaccine HIGH DOSE PF (Fluzone High dose)  DM type 2, controlled, with complication (HCC) - Plan: DG Toe Great Left, DG Foot Complete Left Heavy object did not fall directly on tablet bounced onto the toe with issue on significant bruising but seems to have good function but has underlying neuropathy. Expectant management x-ray today. Had questions about his kidney disease and proteinuria has appointment with nephrologist this week. -Patient advised to return or notify health care team  if  new concerns arise.  Patient Instructions  Get the x-ray today at the [facility] radiology. Dept.   This may be a strong contusion but see if could have a crack in the bone near the nail.   Sometimes we use a stiff shoe and  Boot  To fu . And see ortho or sports ts medicine     Mariann Laster K. Panosh M.D.

## 2017-01-30 LAB — CBC AND DIFFERENTIAL
HCT: 29 — AB (ref 41–53)
Hemoglobin: 9.5 — AB (ref 13.5–17.5)
Platelets: 340 (ref 150–399)
WBC: 7.6

## 2017-01-31 ENCOUNTER — Telehealth: Payer: Self-pay | Admitting: Internal Medicine

## 2017-01-31 NOTE — Telephone Encounter (Signed)
° ° °  Pt call to say he need to know when he was first  Diagnosis with protein in his urine and said he will need a copy     [phone]

## 2017-02-03 ENCOUNTER — Ambulatory Visit (INDEPENDENT_AMBULATORY_CARE_PROVIDER_SITE_OTHER)
Admission: RE | Admit: 2017-02-03 | Discharge: 2017-02-03 | Disposition: A | Discharge status: Home / Self Care | Payer: Medicare Other | Source: Ambulatory Visit | Attending: Internal Medicine | Admitting: Internal Medicine

## 2017-02-03 ENCOUNTER — Encounter: Payer: Self-pay | Admitting: Internal Medicine

## 2017-02-03 ENCOUNTER — Ambulatory Visit
Admission: RE | Admit: 2017-02-03 | Discharge: 2017-02-03 | Disposition: A | Discharge status: Home / Self Care | Payer: Medicare Other | Source: Ambulatory Visit | Attending: Internal Medicine | Admitting: Internal Medicine

## 2017-02-03 ENCOUNTER — Ambulatory Visit (INDEPENDENT_AMBULATORY_CARE_PROVIDER_SITE_OTHER): Payer: Medicare Other | Admitting: Internal Medicine

## 2017-02-03 VITALS — BP 144/74 | HR 70 | Temp 97.8°F | Wt 225.8 lb

## 2017-02-03 DIAGNOSIS — M79675 Pain in left toe(s): Secondary | ICD-10-CM

## 2017-02-03 DIAGNOSIS — R0609 Other forms of dyspnea: Secondary | ICD-10-CM

## 2017-02-03 DIAGNOSIS — R635 Abnormal weight gain: Secondary | ICD-10-CM

## 2017-02-03 DIAGNOSIS — R0789 Other chest pain: Secondary | ICD-10-CM | POA: Diagnosis not present

## 2017-02-03 DIAGNOSIS — N184 Chronic kidney disease, stage 4 (severe): Secondary | ICD-10-CM | POA: Diagnosis not present

## 2017-02-03 DIAGNOSIS — E118 Type 2 diabetes mellitus with unspecified complications: Secondary | ICD-10-CM

## 2017-02-03 DIAGNOSIS — S99922A Unspecified injury of left foot, initial encounter: Secondary | ICD-10-CM

## 2017-02-03 DIAGNOSIS — R609 Edema, unspecified: Secondary | ICD-10-CM | POA: Diagnosis not present

## 2017-02-03 DIAGNOSIS — M7989 Other specified soft tissue disorders: Secondary | ICD-10-CM

## 2017-02-03 MED ORDER — FUROSEMIDE 40 MG PO TABS
40.0000 mg | ORAL_TABLET | Freq: Every day | ORAL | 0 refills | Status: DC
Start: 2017-02-03 — End: 2019-01-27

## 2017-02-03 NOTE — Progress Notes (Signed)
Chief Complaint  Patient presents with  . Shortness of Breath    SOB began about 1 month ago - progressively worse since. Pt notes heavier exertion effects him more; walking stairs, loading groceries into car. Uses nasal spray to help with feeling of congestion/"clogged" in sinuses.  Pt c/o hand and leg swelling    HPI:  Jeremy Black 71 y.o.  SDA    With wife  For  Sob  Doe that has gotten worse over the last  in past 2 weeks and  seom swelling    Al over and weight gain    No fever . Carvedilol added by renal doc   No longer on valsartan or diuretic  maxide .   nose is congested and has appt with   Allergist also   To see  Renal doc in a few  Weeks   Noted  increasing doe and windedness  When went hunting and had to rest a good bit and had twinge of temporary chest pain  Mid line  No vomiting   ROS: See pertinent positives and negatives per HPI. Constipation taking epsonms salt ocass about 2 xd in last month  At most  Others dont work    ?  appt   From the [facility]    .   Appt.   For nov 6th   Past Medical History:  Diagnosis Date  . Allergy   . Arthritis   . Cataract   . Diabetes mellitus without complication ([place])   . ED (erectile dysfunction)   . GERD (gastroesophageal reflux disease)   . Hyperlipidemia   . Hypertension   . Normal nuclear stress test    myoview neg 12 09 except for HT  . Other and unspecified alcohol dependence, unspecified drinking behavior    date of last use 02/13/11  . Proteinuria    past record showed 500mg  protein excretion per 24 hours eval by renal  . PTSD (post-traumatic stress disorder)    from [place] experience [place]  . [facility]    Family History  Problem Relation Age of Onset  . Diabetes Mother   . Hypertension Mother   . Other Mother        nerves  . Throat cancer Father        deceased  . Coronary artery disease Sister   . Diabetes Sister   . Heart disease Sister   .  Hypertension Brother   . Alcohol abuse Paternal Aunt   . Alcohol abuse Cousin   . Drug abuse Son        cocaine dependent    Social History   Social History  . Marital status: Legally Separated    Spouse name: N/A  . Number of children: N/A  . 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 Smoking status: Former Smoker    Packs/day: 0.50    Years: 40.00    Types: Cigarettes    Quit date: 04/15/2009  . Smokeless tobacco: Never Used  . Alcohol use 30.0 oz/week    50 Shots of liquor per week     Comment: Pt in treatment for alcohol dependence. Last date of use 02/13/11.,  Very very little 01/27/13  . Drug use: No  . Sexual activity: Not Asked   Other Topics Concern  . 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   Working with disabled adults.    Plays golf weekly              Outpatient Medications Prior to Visit  Medication Sig Dispense Refill  . acetaminophen (TYLENOL) 325 MG tablet Take 2 tablets (650 mg total) by mouth every 6 (six) hours as needed for mild pain or moderate pain.    Marland Kitchen allopurinol (ZYLOPRIM) 100 MG tablet Take 100 mg by mouth daily.    Marland Kitchen amLODipine (NORVASC) 10 MG tablet Take 1 tablet (10 mg total) by mouth daily. 90 tablet 3  . aspirin 81 MG chewable tablet Chew 81 mg by mouth daily.    Marland Kitchen atorvastatin (LIPITOR) 20 MG tablet Take 20 mg by mouth daily.    . hydrALAZINE (APRESOLINE) 50 MG tablet Take 50 mg by mouth daily.    . montelukast (SINGULAIR) 10 MG tablet Take 10 mg by mouth at bedtime.    Marland Kitchen omeprazole (PRILOSEC) 20 MG capsule Take 20 mg by mouth daily.  From the [place]    . oxyCODONE (ROXICODONE) 5 MG immediate release tablet Take 1 tablet (5 mg total) by mouth every 6 (six) hours as needed for breakthrough pain. 15 tablet 0  . tamsulosin (FLOMAX) 0.4 MG CAPS capsule Take 0.4 mg by mouth.    . traZODone (DESYREL) 50 MG tablet Take 50 mg by mouth at bedtime as needed for sleep.    . valACYclovir (VALTREX) 1000 MG tablet TAKE 1 TABLET (1,000 MG TOTAL) BY MOUTH DAILY. OR AS DIRECTED 30 tablet 5  . ibuprofen (ADVIL) 200 MG tablet Take 3 tablets (600 mg total) by mouth every 6 (six) hours as needed for moderate pain. (Patient not taking: Reported on 02/03/2017)  0  . triamterene-hydrochlorothiazide (MAXZIDE-25) 37.5-25 MG per tablet Take 1 tablet by mouth  daily (Patient not taking: Reported on 02/03/2017) 90 tablet 2  . valsartan (DIOVAN) 160 MG tablet Take 160 mg by mouth daily.     No facility-administered medications prior to visit.      EXAM:  BP (!) 144/74 (BP Location: Left Arm, Patient Position: Sitting, Cuff Size: Normal)   Pulse 70   Temp 97.8 F (36.6 C) (Oral)   Wt 225 lb 12.8 oz (102.4 kg)   SpO2 94%   BMI 32.40 kg/m   Body mass index is 32.4 kg/m.  GENERAL: vitals reviewed and listed above, alert, oriented, appears well hydrated and in no acute distress HEENT: atraumatic, conjunctiva  clear, no obvious abnormalities on inspection of external nose and ears OP : no lesion edema or exudate  NECK: no obvious masses on inspection palpation  LUNGS: clear to auscultation bilaterally, no wheezes, rales or rhonchi,  CV: HRRR, no clubbing cyanosis hands and feet with edema 2+    Nl cap refill  Pale  Nailbeds l  MS: moves all extremities without noticeable focal  abnormality PSYCH: pleasant and cooperative, no obvious depression or anxiety Lab Results  Component Value Date   WBC 6.4 12/18/2016   HGB 10.7 (L) 12/18/2016   HCT 33.4 (  L) 12/18/2016   PLT 373.0 12/18/2016   GLUCOSE 139 (H) 12/18/2016   CHOL 147 04/20/2014   TRIG 129.0  04/20/2014   HDL 42.20 04/20/2014   LDLDIRECT 66.7 01/31/2011   LDLCALC 79 04/20/2014   ALT 15 09/17/2016   AST 16 09/17/2016   NA 141 12/18/2016   K 5.0 12/18/2016   CL 107 12/18/2016   CREATININE 3.95 (H) 12/18/2016   BUN 32 (H) 12/18/2016   CO2 23 12/18/2016   TSH 0.76 07/09/2012   PSA 0.82 09/07/2009   HGBA1C 6.7 (H) 04/20/2014   MICROALBUR 141.5 (H) 09/14/2009   BP Readings from Last 3 Encounters:  02/03/17 (!) 144/74  01/13/17 136/70  12/23/16 140/80   Wt Readings from Last 3 Encounters:  02/03/17 225 lb 12.8 oz (102.4 kg)  01/13/17 217 lb 3.2 oz (98.5 kg)  12/23/16 208 lb 12.8 oz (94.7 kg)  ekg sinus rhythm non spec t wave dec forces in v1 no sig changes from last in system   ASSESSMENT AND PLAN:  Discussed the following assessment and plan:  DOE (dyspnea on exertion) - Plan: EKG 12-Lead, Comprehensive metabolic panel, Brain natriuretic peptide, CBC with Differential/Platelet, DG Chest 2 View  Chronic renal disease, stage IV (HCC) - Plan: Comprehensive metabolic panel, Brain natriuretic peptide, CBC with Differential/Platelet, DG Chest 2 View  Atypical chest pain - Plan: EKG 12-Lead, Comprehensive metabolic panel, Brain natriuretic peptide, CBC with Differential/Platelet, DG Chest 2 View  Weight gain  Edema, unspecified type  Has seen cards    In recent past and   Echo ok so on prn basis   Has  Renal fairlure nephropathy  I have no records but has  Classes about dialysis   [facility]   18# weight gain in one month    Assoc with increasing doe and dec exercise tolerance   Lab and x ray today and he should contact nephrology   About  Plan   And if reasonable to try diuretic although may not  Be effective .  Avoid excess sodium   Fu depending on plan   Concern about  Fluid overload and  chf cv type sx .   -Patient advised to return or notify health care team  if symptoms worsen ,persist or new concerns arise.  Patient Instructions   Concern  about  Kidneys and heart cause of your symptoms . And fluid retenetion .  Can try adding  Diuretic but may not work  Because of the kidneys not  Functioning as well .  Please tell your kidney doctor what is going on   If getting worse go to ED. But   In interim  Blood  Work  Trial of diuretic  .   Chest x ray   ekg shows non specific   Changes   And not diagnostic  .  Advise we get you to see cardiology  Again and   Get back with  Your nephrologist    To see if fluid retention is causeiinfhe problem  I do not think allergy is the issue.   Do not take extra salt such as epsoms salt .       Standley Brooking. Panosh M.D.

## 2017-02-03 NOTE — Telephone Encounter (Signed)
2008 looks to be the year it was diagnosed. Records printed and will be given to patient this afternoon at his OV with [facility]. Nothing further needed.

## 2017-02-03 NOTE — Patient Instructions (Addendum)
  Concern about  Kidneys and heart cause of your symptoms . And fluid retenetion .  Can try adding  Diuretic but may not work  Because of the kidneys not  Functioning as well .  Please tell your kidney doctor what is going on   If getting worse go to ED. But   In interim  Blood  Work  Trial of diuretic  .   Chest x ray   ekg shows non specific   Changes   And not diagnostic  .  Advise we get you to see cardiology  Again and   Get back with  Your nephrologist    To see if fluid retention is causeiinfhe problem  I do not think allergy is the issue.   Do not take extra salt such as epsoms salt .

## 2017-02-04 LAB — COMPREHENSIVE METABOLIC PANEL
ALT: 24 U/L (ref 0–53)
AST: 19 U/L (ref 0–37)
Albumin: 3.9 g/dL (ref 3.5–5.2)
Alkaline Phosphatase: 56 U/L (ref 39–117)
BUN: 36 mg/dL — AB (ref 6–23)
CHLORIDE: 109 meq/L (ref 96–112)
CO2: 22 meq/L (ref 19–32)
Calcium: 9 mg/dL (ref 8.4–10.5)
Creatinine, Ser: 3.88 mg/dL — ABNORMAL HIGH (ref 0.40–1.50)
GFR: 19.79 mL/min — ABNORMAL LOW (ref >60.00)
Glucose, Bld: 118 mg/dL — ABNORMAL HIGH (ref 70–99)
Potassium: 4.3 mEq/L (ref 3.5–5.1)
SODIUM: 141 meq/L (ref 135–145)
Total Bilirubin: 0.3 mg/dL (ref 0.2–1.2)
Total Protein: 6.8 g/dL (ref 6.0–8.3)

## 2017-02-04 LAB — CBC WITH DIFFERENTIAL/PLATELET
BASOS ABS: 0.1 10*3/uL (ref 0.0–0.1)
BASOS PCT: 1.1 % (ref 0.0–3.0)
EOS PCT: 3.1 % (ref 0.0–5.0)
Eosinophils Absolute: 0.2 10*3/uL (ref 0.0–0.7)
HCT: 29.5 % — ABNORMAL LOW (ref 39.0–52.0)
HEMOGLOBIN: 9.7 g/dL — AB (ref 13.0–17.0)
LYMPHS ABS: 1.4 10*3/uL (ref 0.7–4.0)
Lymphocytes Relative: 19.7 % (ref 12.0–46.0)
MCHC: 32.8 g/dL (ref 30.0–36.0)
MCV: 82.5 fl (ref 78.0–100.0)
MONO ABS: 0.6 10*3/uL (ref 0.1–1.0)
MONOS PCT: 8.7 % (ref 3.0–12.0)
NEUTROS PCT: 67.4 % (ref 43.0–77.0)
Neutro Abs: 4.7 10*3/uL (ref 1.4–7.7)
Platelets: 329 10*3/uL (ref 150.0–400.0)
RBC: 3.58 Mil/uL — AB (ref 4.22–5.81)
RDW: 18.1 % — ABNORMAL HIGH (ref 11.5–15.5)
WBC: 7 10*3/uL (ref 4.0–10.5)

## 2017-02-04 LAB — BRAIN NATRIURETIC PEPTIDE: Pro B Natriuretic peptide (BNP): 115 pg/mL — ABNORMAL HIGH (ref 0.0–100.0)

## 2017-02-05 ENCOUNTER — Emergency Department (HOSPITAL_COMMUNITY)
Admission: EM | Admit: 2017-02-05 | Discharge: 2017-02-06 | Disposition: A | Discharge status: Home / Self Care | Payer: Medicare Other | Attending: Physician Assistant | Admitting: Physician Assistant

## 2017-02-05 ENCOUNTER — Encounter (HOSPITAL_COMMUNITY): Payer: Self-pay | Admitting: Emergency Medicine

## 2017-02-05 ENCOUNTER — Emergency Department (HOSPITAL_COMMUNITY): Payer: Medicare Other

## 2017-02-05 DIAGNOSIS — Z87891 Personal history of nicotine dependence: Secondary | ICD-10-CM | POA: Insufficient documentation

## 2017-02-05 DIAGNOSIS — Z7982 Long term (current) use of aspirin: Secondary | ICD-10-CM | POA: Insufficient documentation

## 2017-02-05 DIAGNOSIS — E119 Type 2 diabetes mellitus without complications: Secondary | ICD-10-CM | POA: Insufficient documentation

## 2017-02-05 DIAGNOSIS — R109 Unspecified abdominal pain: Secondary | ICD-10-CM

## 2017-02-05 DIAGNOSIS — R1032 Left lower quadrant pain: Secondary | ICD-10-CM | POA: Diagnosis present

## 2017-02-05 LAB — URINALYSIS, ROUTINE W REFLEX MICROSCOPIC
BILIRUBIN URINE: NEGATIVE
Bacteria, UA: NONE SEEN
GLUCOSE, UA: NEGATIVE mg/dL
HGB URINE DIPSTICK: NEGATIVE
KETONES UR: NEGATIVE mg/dL
LEUKOCYTES UA: NEGATIVE
Nitrite: NEGATIVE
PH: 6 (ref 5.0–8.0)
Protein, ur: >=300 mg/dL — AB
Specific Gravity, Urine: 1.009 (ref 1.005–1.030)

## 2017-02-05 LAB — CBC
HCT: 27.4 % — ABNORMAL LOW (ref 39.0–52.0)
HEMOGLOBIN: 9 g/dL — AB (ref 13.0–17.0)
MCH: 26.9 pg (ref 26.0–34.0)
MCHC: 32.8 g/dL (ref 30.0–36.0)
MCV: 81.8 fL (ref 78.0–100.0)
Platelets: 249 10*3/uL (ref 150–400)
RBC: 3.35 MIL/uL — ABNORMAL LOW (ref 4.22–5.81)
RDW: 16.9 % — AB (ref 11.5–15.5)
WBC: 7.9 10*3/uL (ref 4.0–10.5)

## 2017-02-05 LAB — COMPREHENSIVE METABOLIC PANEL
ALBUMIN: 3.8 g/dL (ref 3.5–5.0)
ALK PHOS: 63 U/L (ref 38–126)
ALT: 23 U/L (ref 17–63)
ANION GAP: 12 (ref 5–15)
AST: 20 U/L (ref 15–41)
BILIRUBIN TOTAL: 0.5 mg/dL (ref 0.3–1.2)
BUN: 32 mg/dL — ABNORMAL HIGH (ref 6–20)
CALCIUM: 8.9 mg/dL (ref 8.9–10.3)
CO2: 20 mmol/L — ABNORMAL LOW (ref 22–32)
Chloride: 108 mmol/L (ref 101–111)
Creatinine, Ser: 3.98 mg/dL — ABNORMAL HIGH (ref 0.61–1.24)
GFR calc Af Amer: 16 mL/min — ABNORMAL LOW (ref >60)
GFR calc non Af Amer: 14 mL/min — ABNORMAL LOW (ref >60)
GLUCOSE: 102 mg/dL — AB (ref 65–99)
Potassium: 4.1 mmol/L (ref 3.5–5.1)
Sodium: 140 mmol/L (ref 135–145)
TOTAL PROTEIN: 7.2 g/dL (ref 6.5–8.1)

## 2017-02-05 LAB — LIPASE, BLOOD: Lipase: 35 U/L (ref 11–51)

## 2017-02-05 MED ORDER — CYCLOBENZAPRINE HCL 10 MG PO TABS
5.0000 mg | ORAL_TABLET | Freq: Once | ORAL | Status: AC
  Administered 2017-02-06: 5 mg via ORAL
  Filled 2017-02-05: qty 1

## 2017-02-05 MED ORDER — CYCLOBENZAPRINE HCL 5 MG PO TABS: 5.0000 mg | ORAL_TABLET | Freq: Two times a day (BID) | ORAL | 0 refills | Status: DC | PRN

## 2017-02-05 MED ORDER — CYCLOBENZAPRINE HCL 10 MG PO TABS: 10.0000 mg | ORAL_TABLET | Freq: Once | ORAL | Status: DC

## 2017-02-05 NOTE — ED Provider Notes (Signed)
[facility] DEPT Provider Note   CSN: [id number] Arrival date & time: 02/05/17  1650     History   Chief Complaint Chief Complaint  Patient presents with  . Flank Pain  . Hypertension    HPI BROWN DUNLAP is a 71 y.o. male.  HPI   Mr. Arcia is a 71 year old male with a history of hypertension, CKD stage IV, type 2 diabetes, hyperlipidemia, obesity who presents the emergency department with left flank pain. He states that this began yesterday and has progressively worsened. Pain is 8/10 in severity, intermittent and "sharp" in nature. He states that sometimes changing positions improves the pain. Pain does not radiate. No known triggers for pain, it seems to come and go on it's own. Denies fever, abdominal pain, nausea/vomiting, diarrhea, dysuria, urgency, hematuria, chest pain, shortness of breath, cough. States that he has increased urinary frequency since he started lasix this week. Gets most of his care at the [place], seeing nephrology for potential need for future dialysis. He denies history of previous stones. Is able to tolerate PO fluids. Has never had this pain in the past. Denies recent injury or heavy lifting.    Past Medical History:  Diagnosis Date  . Allergy   . Arthritis   . Cataract   . Diabetes mellitus without complication ([place])   . ED (erectile dysfunction)   . GERD (gastroesophageal reflux disease)   . Hyperlipidemia   . Hypertension   . Normal nuclear stress test    myoview neg 12 09 except for HT  . Other and unspecified alcohol dependence, unspecified drinking behavior    date of last use 02/13/11  . Proteinuria    past record showed 500mg  protein excretion per 24 hours eval by renal  . PTSD (post-traumatic stress disorder)    from [place] experience [place]  . S/[facility] dil[facility] strict[facility]    [facility] Active Problem List   Diagnosis Date Noted  . Type 2 diabetes mellitus with diabetic nephropathy ([place])  11/17/2013  . Diabetes mellitus, type 2 ([place]) 09/14/2013  . Leg cramps 02/23/2013  . Other male sexual dysfunction 02/23/2013  . CMC arthritis, thumb, degenerative 08/25/2012  . Diabetes mellitus, new onset ([place]) 07/21/2012  . Thumb pain 07/21/2012  . SOB (shortness of breath) 06/30/2012  . Bradycardia 06/30/2012  . S/P dilatation of esophageal stricture   . Pre-diabetes 01/26/2012  . Renal insufficiency  cr 1.3 range 01/26/2012  . Burning pain 01/21/2011  . OBESITY 01/11/2010  . OSTEOARTHRITIS, HAND 01/11/2010  . URINALYSIS, ABNORMAL 09/14/2009  . TOBACCO USE, QUIT 06/29/2009  . TRIGGER FINGER 10/31/2008  . CHEST PAIN, ATYPICAL 03/15/2008  . Proteinuria 11/19/2006  . HYPERLIPIDEMIA 10/22/2006  . ERECTILE DYSFUNCTION 10/22/2006  . Essential hypertension 10/22/2006  . DEGENERATION, DISC NOS 10/22/2006  . INSOMNIA 10/22/2006  . POSITIVE PPD 10/22/2006    Past Surgical History:  Procedure Laterality Date  . ls spinal surgery    . NM MYOVIEW LTD  12/09   neg except for HT  . TRIGGER FINGER RELEASE Left 10/18/2016   Procedure: LEFT TRIGGER THUMB RELEASE;  Surgeon: Milly Jakob, MD;  Location: [facility];  Service: Orthopedics;  Laterality: Left;       Home Medications    Prior to Admission medications   Medication Sig Start Date End Date Taking? Authorizing Provider  allopurinol (ZYLOPRIM) 100 MG tablet Take 100 mg by mouth daily.   Yes [provider]  amLODipine (NORVASC) 10 MG tablet Take 1 tablet (10 mg total) by mouth daily. 09/15/13  Yes Panosh, Standley Brooking, MD  aspirin 81 MG chewable tablet Chew 81 mg by mouth daily.   Yes [provider]  atorvastatin (LIPITOR) 20 MG tablet Take 20 mg by mouth daily.   Yes [provider]  carvedilol (COREG) 12.5 MG tablet Take 6.25 mg by mouth 2 (two) times daily with a meal.   Yes [provider]  furosemide (LASIX) 40 MG tablet Take 1-2 tablets (40-80 mg total) by mouth daily. Or as  directed Patient taking differently: Take 40 mg by mouth 2 (two) times daily. Take 1 tablet (40 mg) BID for 1 week. Then take 1 tablet (40 mg) daily 02/03/17  Yes Panosh, Standley Brooking, MD  gabapentin (NEURONTIN) 100 MG capsule Take 100 mg by mouth 2 (two) times daily.   Yes [provider]  hydrALAZINE (APRESOLINE) 50 MG tablet Take 50 mg by mouth 3 (three) times daily.    Yes [provider]  montelukast (SINGULAIR) 10 MG tablet Take 10 mg by mouth daily.    Yes [provider]  omeprazole (PRILOSEC) 20 MG capsule Take 20 mg by mouth daily. From the [facility]   Yes [provider]  tamsulosin (FLOMAX) 0.4 MG CAPS capsule Take 0.4 mg by mouth daily.    Yes [provider]  traZODone (DESYREL) 50 MG tablet Take 25 mg by mouth at bedtime as needed for sleep.    Yes [provider]  acetaminophen (TYLENOL) 325 MG tablet Take 2 tablets (650 mg total) by mouth every 6 (six) hours as needed for mild pain or moderate pain. Patient not taking: Reported on 02/05/2017 10/18/16   Milly Jakob, MD  cyclobenzaprine (FLEXERIL) 5 MG tablet Take 1 tablet (5 mg total) by mouth 2 (two) times daily as needed for muscle spasms. 02/05/17   Glyn Ade, PA-C  oxyCODONE (ROXICODONE) 5 MG immediate release tablet Take 1 tablet (5 mg total) by mouth every 6 (six) hours as needed for breakthrough pain. Patient not taking: Reported on 02/05/2017 10/18/16   Milly Jakob, MD  valACYclovir (VALTREX) 1000 MG tablet TAKE 1 TABLET (1,000 MG TOTAL) BY MOUTH DAILY. OR AS DIRECTED Patient taking differently: TAKE 1 TABLET (1,000 MG TOTAL) BY MOUTH DAILY PRN FOR OUTBREAKS 07/24/15   Panosh, Standley Brooking, MD    Family History Family History  Problem Relation Age of Onset  . Diabetes Mother   . Hypertension Mother   . Other Mother        nerves  . Throat cancer Father        deceased  . Coronary artery disease Sister   . Diabetes Sister   . Heart disease Sister   . Hypertension  Brother   . Alcohol abuse Paternal Aunt   . Alcohol abuse Cousin   . Drug abuse Son        cocaine dependent    Social History Social History  Substance Use Topics  . Smoking status: Former Smoker    Packs/day: 0.50    Years: 40.00    Types: Cigarettes    Quit date: 04/15/2009  . Smokeless tobacco: Never Used  . Alcohol use 30.0 oz/week    50 Shots of liquor per week     Comment: Pt in treatment for alcohol dependence. Last date of use 02/13/11.,  Very very little 01/27/13     Allergies   Codeine phosphate and Lisinopril   Review  of Systems Review of Systems  Constitutional: Negative for chills, fatigue, fever and unexpected weight change.  HENT: Negative for congestion and sore throat.   Respiratory: Negative for cough, shortness of breath and wheezing.   Cardiovascular: Negative for chest pain.  Gastrointestinal: Negative for abdominal pain, diarrhea, nausea and vomiting.  Genitourinary: Positive for flank pain and frequency. Negative for difficulty urinating, dysuria, penile pain, testicular pain and urgency.  Musculoskeletal: Negative for back pain and gait problem.  Skin: Negative for rash and wound.  Neurological: Negative for weakness, light-headedness, numbness and headaches.  Psychiatric/Behavioral: Negative for agitation.     Physical Exam Updated Vital Signs BP (!) 169/90 (BP Location: Left Arm)   Pulse 80   Temp 97.9 F (36.6 C) (Oral)   Resp 18   Ht 5\' 10"  (1.778 m)   Wt 102.1 kg (225 lb)   SpO2 97%   BMI 32.28 kg/m   Physical Exam  Constitutional: He is oriented to person, place, and time. He appears well-developed and well-nourished. No distress.  HENT:  Head: Normocephalic and atraumatic.  Nose: Nose normal.  Mouth/Throat: Oropharynx is clear and moist. No oropharyngeal exudate.  Eyes: Pupils are equal, round, and reactive to light. Conjunctivae are normal. Right eye exhibits no discharge. Left eye exhibits no discharge.  Neck: Normal range  of motion. Neck supple.  Cardiovascular: Normal rate, regular rhythm and intact distal pulses.  Exam reveals no friction rub.   No murmur heard. Pulmonary/Chest: Effort normal and breath sounds normal. No respiratory distress. He has no wheezes. He has no rales.  Abdominal: Soft. Bowel sounds are normal.  Abdomen distended, fluid wave noted. No tenderness, guarding, rigidity or rebound. No CVA tenderness.   Musculoskeletal: Normal range of motion.  Tenderness to palpation over the left lower flank. No T-spine or L-spine tenderness. Strength 5/5 in bilateral lower extremities. DP and PT pulses 2+ bilaterally.   Lymphadenopathy:    He has no cervical adenopathy.  Neurological: He is alert and oriented to person, place, and time. Coordination normal.  Skin: Skin is warm and dry. Capillary refill takes less than 2 seconds. He is not diaphoretic.  Psychiatric: He has a normal mood and affect. His behavior is normal.  Nursing note and vitals reviewed.    ED Treatments / Results  Labs (all labs ordered are listed, but only abnormal results are displayed) Labs Reviewed  URINALYSIS, ROUTINE W REFLEX MICROSCOPIC - Abnormal; Notable for the following:       Result Value   Protein, ur >=300 (*)    Squamous Epithelial / LPF 0-5 (*)    All other components within normal limits  CBC - Abnormal; Notable for the following:    RBC 3.35 (*)    Hemoglobin 9.0 (*)    HCT 27.4 (*)    RDW 16.9 (*)    All other components within normal limits  COMPREHENSIVE METABOLIC PANEL - Abnormal; Notable for the following:    CO2 20 (*)    Glucose, Bld 102 (*)    BUN 32 (*)    Creatinine, Ser 3.98 (*)    GFR calc non Af Amer 14 (*)    GFR calc Af Amer 16 (*)    All other components within normal limits  LIPASE, BLOOD    EKG  EKG Interpretation None       Radiology Ct Renal Stone Study  Result Date: 02/05/2017 CLINICAL DATA:  Left-sided flank pain EXAM: CT ABDOMEN AND PELVIS WITHOUT CONTRAST  TECHNIQUE: Multidetector CT  imaging of the abdomen and pelvis was performed following the standard protocol without IV contrast. COMPARISON:  12/18/2016 FINDINGS: Lower chest: Small bilateral pleural effusions. Small pericardial effusion. Linear atelectasis or scarring at both bases. Hepatobiliary: No focal liver abnormality is seen. No gallstones, gallbladder wall thickening, or biliary dilatation. Pancreas: Unremarkable. No pancreatic ductal dilatation or surrounding inflammatory changes. Spleen: Normal in size without focal abnormality. Adrenals/Urinary Tract: Adrenal glands are within normal limits. Bilateral renal cysts. No hydronephrosis. The bladder is unremarkable Stomach/Bowel: Stomach is within normal limits. Appendix appears normal. No evidence of bowel wall thickening, distention, or inflammatory changes. Sigmoid colon diverticular disease without acute inflammation. Vascular/Lymphatic: Aortic atherosclerosis. No aneurysmal dilatation. No significantly enlarged lymph nodes. Reproductive: Prostate is unremarkable. Other: Negative for free air. Small free fluid adjacent to the liver. Musculoskeletal: Postsurgical changes at L4-L5 with posterior rod and stabilization screws. Grade 1 anterolisthesis of L3 on L4. Trace retrolisthesis of L5 on S1. Interbody device at L4-L5 with bony fusion present. IMPRESSION: 1. Negative for hydronephrosis, nephrolithiasis, or ureteral stones. 2. There are small bilateral effusions. There is a small pericardial effusion. 3. Sigmoid colon diverticular disease without acute inflammation. 4. Small free fluid adjacent to the liver Electronically Signed   By: Donavan Foil M.D.   On: 02/05/2017 23:06    Procedures Procedures (including critical care time)  Medications Ordered in ED Medications  cyclobenzaprine (FLEXERIL) tablet 5 mg (not administered)     Initial Impression / Assessment and Plan / ED Course  I have reviewed the triage vital signs and the nursing  notes.  Pertinent labs & imaging results that were available during my care of the patient were reviewed by me and considered in my medical decision making (see chart for details).    Patient with left sided flank pain beginning yesterday. Labs reviewed. UA not infected, no RBCs. Lipase normal, do not suspect pancreatitis. CMP with Creatinine 3.98, this is baseline for him. Glucose is mildly elevated at 102, this is also baseline. No white count. Patient is anemic with Hgb 9.0, this is baseline.   Discussed this patient with Dr. Thomasene Lot who also saw the patient. Given that his pain is not associated with movement, will get a CT stone study to further evaluate for nephrolithiasis. CT scan does not show stone. There is mild pericardial effusion, patient denies symptoms of chest pain or SOB. Doubt that this is related to his problem today. Informed patient and instructed him to let his cardiologist know at next appointment   Patient's pain is likely musculoskeletal. Will treat with tylenol and low dose of muscle relaxer. Patient has been informed that muscle relaxer may make him drowsy and he should not drive or drink alcohol while taking it. He agrees and voices understanding. Patient is able to ambulate independently and tolerate PO fluids at the bedside. His blood pressure is elevated in the ER today, discussed he follow up with his primary doctor for recheck. Patient agrees with above plan. Dr. Thomasene Lot also agrees with plan to discharge.   Final Clinical Impressions(s) / ED Diagnoses   Final diagnoses:  Left flank pain    New Prescriptions New Prescriptions   CYCLOBENZAPRINE (FLEXERIL) 5 MG TABLET    Take 1 tablet (5 mg total) by mouth 2 (two) times daily as needed for muscle spasms.     Glyn Ade, PA-C 02/05/17 2340    Macarthur Critchley, MD 02/05/17 2345

## 2017-02-05 NOTE — ED Notes (Signed)
Attempted to get blood two times, but was unsuccessful.

## 2017-02-05 NOTE — ED Triage Notes (Signed)
Pt c/o hypertension (pt is asymptomatic) that has been since Monday when he went to see his PCP and they changed his medications. Also c/o swelling in left flank area and tender upon palpation. Denies urinary issues. Stage 4 renal disease hx.

## 2017-02-05 NOTE — Discharge Instructions (Signed)
Your lab work shows that you have chronic kidney disease and anemia. These results are similar to your lab work in the past. Liver enzymes normal. White blood cell count normal. Pancreas test normal. Your urine is not infected. The CT scan did not show a kidney stone or obstruction.   Your CT scan showed a small pericardial effusion (fluid around the heart.) This may be causing you to have shortness of breath. Please tell your cardiologist when you see them at your appointment tomorrow.   Your pain is likely musculoskeletal. I have written you a prescription for flexeril which is a muscle relaxer for your pain. This medicine can make you drowsy, please do not drive while taking this medication. You can also take Tylenol for your pain. Applying heat to the lower back can also help your symptoms.   Please follow up with your primary doctor if your symptoms do not improve in a week. Please return if you develop fever, inability to pass urine, if you get vomiting that will not stop or worsening abdominal pain. Please also return if you have new or worsening symptoms.

## 2017-02-05 NOTE — ED Notes (Signed)
Sent urine culture with UA.

## 2017-02-13 DIAGNOSIS — C61 Malignant neoplasm of prostate: Secondary | ICD-10-CM

## 2017-02-13 HISTORY — DX: Malignant neoplasm of prostate: C61

## 2017-04-30 LAB — HM DIABETES EYE EXAM

## 2017-05-08 ENCOUNTER — Telehealth: Payer: Self-pay | Admitting: Internal Medicine

## 2017-05-08 NOTE — Telephone Encounter (Signed)
Called and LVM for Leigh with requested information. Advised her to call us if she needs anything else. Nothing further needed at this time.

## 2017-05-08 NOTE — Telephone Encounter (Unsigned)
Copied from [place] [phone]. Topic: Inquiry >> May 08, 2017  9:12 AM Neva Seat wrote: Meriel Flavors [phone]  ext [phone] [facility] Nurse Case Manager   Needing pt's most recent  BP and A1C

## 2017-05-13 ENCOUNTER — Encounter: Payer: Self-pay | Admitting: Internal Medicine

## 2017-05-22 ENCOUNTER — Encounter: Payer: Self-pay | Admitting: Physician Assistant

## 2017-06-03 ENCOUNTER — Encounter: Payer: Self-pay | Admitting: Physician Assistant

## 2017-06-03 ENCOUNTER — Ambulatory Visit: Payer: Medicare Other | Admitting: Physician Assistant

## 2017-06-03 VITALS — BP 154/68 | HR 74 | Ht 70.0 in | Wt 207.4 lb

## 2017-06-03 DIAGNOSIS — K219 Gastro-esophageal reflux disease without esophagitis: Secondary | ICD-10-CM | POA: Diagnosis not present

## 2017-06-03 DIAGNOSIS — Z1211 Encounter for screening for malignant neoplasm of colon: Secondary | ICD-10-CM

## 2017-06-03 NOTE — Patient Instructions (Signed)
You have been scheduled for a colonoscopy and endoscopy. Please follow written instructions given to you at your visit today.  Please pick up your prep supplies at the pharmacy within the next 1-3 days. If you use inhalers (even only as needed), please bring them with you on the day of your procedure. Your physician has requested that you go to www.[email or web address].com and enter the access code given to you at your visit today. This web site gives a general overview about your procedure. However, you should still follow specific instructions given to you by our office regarding your preparation for the procedure.

## 2017-06-03 NOTE — Progress Notes (Addendum)
Subjective:    Patient ID: ADI DORO, male    DOB: [birth year], 72 y.o.   MRN: [id number]  HPI Rea is a pleasant 72 year old African-American male, new to GI today referred by the [facility] for colonoscopy and EGD, as part of pretransplant workup for pending kidney transplant. Patient has history of hypertension, adult-onset diabetes mellitus, osteoarthritis, chronic kidney disease with current creatinine of 5, congestive heart failure EF of 60%. He is not currently on dialysis but has had dialysis catheter graft placed. He reports that he did have prior colonoscopy in 2016 done through the [place] which was negative. I have received some records from the [place]. I do not have report of that colonoscopy but records state no polyps found. Patient believes he had 1 prior EGD done for 5 years ago through the [facility] with dilation of his esophagus. He has no current complaints of heartburn or indigestion, he is maintained on chronic PPI therapy which she says controls this. No current dysphagia or odynophagia. He denies any abdominal pain. He is now on iron supplementation for chronic anemia and says it does make him constipated. He is using MiraLAX which is helpful. He has not noticed any melena or hematochezia. No current blood thinners Family history is negative for colon cancer and polyps.  Review of Systems Pertinent positive and negative review of systems were noted in the above HPI section.  All other review of systems was otherwise negative.  Outpatient Encounter Medications as of 06/03/2017  Medication Sig  . allopurinol (ZYLOPRIM) 100 MG tablet Take 100 mg by mouth daily.  Marland Kitchen amLODipine (NORVASC) 10 MG tablet Take 1 tablet (10 mg total) by mouth daily.  Marland Kitchen atorvastatin (LIPITOR) 20 MG tablet Take 20 mg by mouth daily.  . carvedilol (COREG) 12.5 MG tablet Take 6.25 mg by mouth 2 (two) times daily with a meal.  . cephALEXin (KEFLEX) 250 MG capsule Take by mouth 2 (two) times daily.  .  cetirizine (ZYRTEC) 10 MG tablet Take 10 mg by mouth daily.  . fluticasone (FLONASE) 50 MCG/ACT nasal spray Place 2 sprays into both nostrils 2 (two) times daily.  . furosemide (LASIX) 40 MG tablet Take 1-2 tablets (40-80 mg total) by mouth daily. Or as directed (Patient taking differently: Take 40 mg by mouth 2 (two) times daily. Take 1 tablet (40 mg) BID for 1 week. Then take 1 tablet (40 mg) daily)  . gabapentin (NEURONTIN) 100 MG capsule Take 100 mg by mouth 2 (two) times daily.  . hydrALAZINE (APRESOLINE) 50 MG tablet Take 50 mg by mouth 3 (three) times daily.   Marland Kitchen HYDROCODONE-ACETAMINOPHEN PO Take 325 mg by mouth every 6 (six) hours as needed.  . hydroxypropyl methylcellulose / hypromellose (ISOPTO TEARS / GONIOVISC) 2.5 % ophthalmic solution Place 1 drop into both eyes 2 (two) times daily.  Marland Kitchen ketotifen (ZADITOR) 0.025 % ophthalmic solution Place 1 drop into both eyes 2 (two) times daily as needed.  . loratadine (CLARITIN) 10 MG tablet Take 10 mg by mouth at bedtime.  . metFORMIN (GLUCOPHAGE) 500 MG tablet Take by mouth daily with breakfast.  . montelukast (SINGULAIR) 10 MG tablet Take 10 mg by mouth daily.   Marland Kitchen omeprazole (PRILOSEC) 20 MG capsule Take 20 mg by mouth daily. From the [place]  . polyethylene glycol (MIRALAX / GLYCOLAX) packet Take 17 g by mouth daily as needed.  . tamsulosin (FLOMAX) 0.4 MG CAPS capsule Take 0.4 mg by mouth at bedtime.  Marland Kitchen telmisartan (MICARDIS)  80 MG tablet Take 80 mg by mouth 2 (two) times daily.  Marland Kitchen torsemide (DEMADEX) 20 MG tablet Take 20 mg by mouth daily. Take 1/2 tablet daily  . traZODone (DESYREL) 50 MG tablet Take 25 mg by mouth at bedtime as needed for sleep.   Marland Kitchen triamterene-hydrochlorothiazide (MAXZIDE-25) 37.5-25 MG tablet Take 1 tablet by mouth daily.  . valACYclovir (VALTREX) 1000 MG tablet TAKE 1 TABLET (1,000 MG TOTAL) BY MOUTH DAILY. OR AS DIRECTED (Patient taking differently: TAKE 1 TABLET (1,000 MG TOTAL) BY MOUTH DAILY PRN FOR OUTBREAKS)  .  vardenafil (LEVITRA) 20 MG tablet Take 20 mg by mouth daily as needed for erectile dysfunction.   No facility-administered encounter medications on file as of 06/03/2017.    Allergies  Allergen Reactions  . Codeine Phosphate     Pt unsure of reaction   . Lisinopril Cough   Patient Active Problem List   Diagnosis Date Noted  . Type 2 diabetes mellitus with diabetic nephropathy ([place]) 11/17/2013  . Diabetes mellitus, type 2 ([place]) 09/14/2013  . Leg cramps 02/23/2013  . Other male sexual dysfunction 02/23/2013  . CMC arthritis, thumb, degenerative 08/25/2012  . Diabetes mellitus, new onset ([place]) 07/21/2012  . Thumb pain 07/21/2012  . SOB (shortness of breath) 06/30/2012  . Bradycardia 06/30/2012  . S/P dilatation of esophageal stricture   . Pre-diabetes 01/26/2012  . Renal insufficiency  cr 1.3 range 01/26/2012  . Burning pain 01/21/2011  . OBESITY 01/11/2010  . OSTEOARTHRITIS, HAND 01/11/2010  . URINALYSIS, ABNORMAL 09/14/2009  . TOBACCO USE, QUIT 06/29/2009  . TRIGGER FINGER 10/31/2008  . CHEST PAIN, ATYPICAL 03/15/2008  . Proteinuria 11/19/2006  . HYPERLIPIDEMIA 10/22/2006  . ERECTILE DYSFUNCTION 10/22/2006  . Essential hypertension 10/22/2006  . DEGENERATION, DISC NOS 10/22/2006  . INSOMNIA 10/22/2006  . POSITIVE PPD 10/22/2006   Social History   Socioeconomic History  . Marital status: Legally Separated    Spouse name: Not on file  . Number of children: Not on file  . Years of education: Not on file  . Highest education level: Not on file  Social Needs  . Financial resource strain: Not on file  . Food insecurity - worry: Not on file  . Food insecurity - inability: Not on file  . Transportation needs - medical: Not on file  . Transportation needs - non-medical: Not on file  Occupational History  . 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 Smoking status: Former Smoker    Packs/day: 0.50    Years: 40.00    Pack years: 20.00    Types:  Cigarettes    Last attempt to quit: 04/15/2009    Years since quitting: 8.1  . Smokeless tobacco: Never Used  Substance and Sexual Activity  . Alcohol use: Yes    Alcohol/week: 30.0 oz    Types: 50 Shots of liquor per week    Comment: Pt in treatment for alcohol dependence. Last date of use 02/13/11.,  Very very little 01/27/13  . Drug use: No  . Sexual activity: Not on file  Other Topics Concern  . 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   Working with disabled adults.    Plays golf weekly              Mr. Grabel family history includes Alcohol abuse in his cousin and paternal aunt; Coronary artery disease in his sister; Diabetes in his mother and sister; Drug abuse in his son; Heart disease in his sister; Hypertension in his brother and mother; Other in his mother; Throat cancer in his father.      Objective:    Vitals:   06/03/17 0901  BP: (!) 154/68  Pulse: 74    Physical Exam  ; well-developed older African-American male in no acute distress, accompanied by his wife both pleasant blood pressure 154/68, height 5 foot 10, weight 207, BMI 29.7. HEENT ;nontraumatic normocephalic EOMI PERRLA sclera anicteric, Cardiovascular; regular rate and rhythm with S1-S2 no murmur or gallop, Pulmonary; clear bilaterally, Abdomen; soft nontender nondistended bowel sounds are active is no palpable mass or hepatosplenomegaly, Rectal ;exam not done, Extremities; no clubbing cyanosis or edema skin warm and dry he does have a graft in the right upper extremity, Neuropsych ;mood and affect appropriate       Assessment & Plan:   #70 72 year old African-American male  referred for pretransplant clearance colonoscopy and EGD. Patient is referred from the [facility]. Currently asymptomatic with no upper or lower GI complaints No prior history of colon polyps #2 chronic GERD with history of prior esophageal dilation-no current dysphagia or odynophagia #3 chronic kidney disease-baseline creatinine of 5-awaiting kidney transplant #4 hypertension #5 adult-onset diabetes mellitus #6 congestive heart failure with EF of 60% #7 PTSD #8 recent diagnosis of prostate cancer #9 chronic anemia/iron deficient by reports  Plan; Patient will be scheduled for colonoscopy and upper endoscopy with Dr. Carlean Purl. Both procedures were discussed in detail with the patient including indications risks and benefits and he is agreeable to proceed. Continue omeprazole 20 mg by mouth every morning  [place] Cantero S Dezzie Badilla PA-C 06/03/2017   Cc: [name], MD   Agree with Ms. Genia Harold assessment and plan. Gatha Mayer, MD, [facility] [place]

## 2017-07-08 ENCOUNTER — Ambulatory Visit (AMBULATORY_SURGERY_CENTER): Payer: No Typology Code available for payment source | Admitting: Internal Medicine

## 2017-07-08 ENCOUNTER — Other Ambulatory Visit: Payer: Self-pay

## 2017-07-08 ENCOUNTER — Encounter: Payer: Self-pay | Admitting: Internal Medicine

## 2017-07-08 VITALS — BP 155/67 | HR 64 | Temp 98.9°F | Resp 10 | Ht 70.0 in | Wt 207.0 lb

## 2017-07-08 DIAGNOSIS — D124 Benign neoplasm of descending colon: Secondary | ICD-10-CM

## 2017-07-08 DIAGNOSIS — K219 Gastro-esophageal reflux disease without esophagitis: Secondary | ICD-10-CM | POA: Diagnosis not present

## 2017-07-08 DIAGNOSIS — Z1211 Encounter for screening for malignant neoplasm of colon: Secondary | ICD-10-CM

## 2017-07-08 DIAGNOSIS — C61 Malignant neoplasm of prostate: Secondary | ICD-10-CM

## 2017-07-08 DIAGNOSIS — D123 Benign neoplasm of transverse colon: Secondary | ICD-10-CM

## 2017-07-08 MED ORDER — SODIUM CHLORIDE 0.9 % IV SOLN: 500.0000 mL | Freq: Once | INTRAVENOUS | Status: DC

## 2017-07-08 NOTE — Progress Notes (Signed)
Pt's states no medical or surgical changes since previsit or office visit. 

## 2017-07-08 NOTE — Op Note (Signed)
[facility] Patient Name: Jeremy Black Procedure Date: 07/08/2017 2:36 PM MRN: [id number] Endoscopist: Gatha Mayer , MD Age: 72 Referring MD:  Date of Birth: 1945-08-21 Gender: Male Account #: [id number] Procedure:                Upper GI endoscopy Indications:              Esophageal reflux Medicines:                Propofol per Anesthesia, Monitored Anesthesia Care Procedure:                Pre-Anesthesia Assessment:                           - Prior to the procedure, a History and Physical                            was performed, and patient medications and                            allergies were reviewed. The patient's tolerance of                            previous anesthesia was also reviewed. The risks                            and benefits of the procedure and the sedation                            options and risks were discussed with the patient.                            All questions were answered, and informed consent                            was obtained. Prior Anticoagulants: The patient has                            taken no previous anticoagulant or antiplatelet                            agents. ASA Grade Assessment: III - A patient with                            severe systemic disease. After reviewing the risks                            and benefits, the patient was deemed in                            satisfactory condition to undergo the procedure.                           After obtaining informed consent, the endoscope was  passed under direct vision. Throughout the                            procedure, the patient's blood pressure, pulse, and                            oxygen saturations were monitored continuously. The                            Endoscope was introduced through the mouth, and                            advanced to the second part of duodenum. The upper                            GI endoscopy  was accomplished without difficulty.                            The patient tolerated the procedure well. Scope In: Scope Out: Findings:                 The esophagus was normal.                           The stomach was normal.                           The examined duodenum was normal.                           The cardia and gastric fundus were normal on                            retroflexion. Complications:            No immediate complications. Estimated Blood Loss:     Estimated blood loss: none. Impression:               - Normal esophagus.                           - Normal stomach.                           - Normal examined duodenum.                           - No specimens collected. Recommendation:           - Patient has a contact number available for                            emergencies. The signs and symptoms of potential                            delayed complications were discussed with the  patient. Return to normal activities tomorrow.                            Written discharge instructions were provided to the                            patient.                           - Resume previous diet.                           - Continue present medications.                           - See the other procedure note for documentation of                            additional recommendations. Gatha Mayer, MD 07/08/2017 3:18:58 PM This report has been signed electronically.

## 2017-07-08 NOTE — Progress Notes (Signed)
No problems noted in the recovery room. Maw  Pt asked if ok to go to dialysis tomorrow.  He said that they will use a blood thinner also.  I call Dr. Celesta Aver office and his cell. He did not answer either.  Left him a message on his cell.  I went to the third floor and asked Dr. Henrene Pastor if ok for pt to go to dialysis and a blood thinner used.  Per Dr. Henrene Pastor, yes this ok to go dialysis and they will do as they normally do.  Dr. Carlean Purl called back and he said the same thing as Dr. Henrene Pastor.  Pt and his wife were notified. maw

## 2017-07-08 NOTE — Progress Notes (Signed)
Spontaneous respirations throughout. VSS. Resting comfortably. To PACU on room air. Report to  RN. 

## 2017-07-08 NOTE — Progress Notes (Signed)
Called to room to assist during endoscopic procedure.  Patient ID and intended procedure confirmed with present staff. Received instructions for my participation in the procedure from the performing physician.  

## 2017-07-08 NOTE — Patient Instructions (Addendum)
I found and removed 3 polyps - all look benign. I will let you know pathology results and when to have another routine colonoscopy by mail and/or My Chart.  You also have a condition called diverticulosis - common and not usually a problem. Please read the handout provided.  The esophagus, stomach and duodenum looked normal.  I appreciate the opportunity to care for you. Gatha Mayer, MD, FACG  YOU HAD AN ENDOSCOPIC PROCEDURE TODAY AT [facility]:   Refer to the procedure report that was given to you for any specific questions about what was found during the examination.  If the procedure report does not answer your questions, please call your gastroenterologist to clarify.  If you requested that your care partner not be given the details of your procedure findings, then the procedure report has been included in a sealed envelope for you to review at your convenience later.  YOU SHOULD EXPECT: Some feelings of bloating in the abdomen. Passage of more gas than usual.  Walking can help get rid of the air that was put into your GI tract during the procedure and reduce the bloating. If you had a lower endoscopy (such as a colonoscopy or flexible sigmoidoscopy) you may notice spotting of blood in your stool or on the toilet paper. If you underwent a bowel prep for your procedure, you may not have a normal bowel movement for a few days.  Please Note:  You might notice some irritation and congestion in your nose or some drainage.  This is from the oxygen used during your procedure.  There is no need for concern and it should clear up in a day or so.  SYMPTOMS TO REPORT IMMEDIATELY:   Following lower endoscopy (colonoscopy or flexible sigmoidoscopy):  Excessive amounts of blood in the stool  Significant tenderness or worsening of abdominal pains  Swelling of the abdomen that is new, acute  Fever of 100F or higher   Following upper endoscopy (EGD)  Vomiting of blood or  coffee ground material  New chest pain or pain under the shoulder blades  Painful or persistently difficult swallowing  New shortness of breath  Fever of 100F or higher  Black, tarry-looking stools  For urgent or emergent issues, a gastroenterologist can be reached at any hour by calling [phone].   DIET:  We do recommend a small meal at first, but then you may proceed to your regular diet.  Drink plenty of fluids but you should avoid alcoholic beverages for 24 hours.  ACTIVITY:  You should plan to take it easy for the rest of today and you should NOT DRIVE or use heavy machinery until tomorrow (because of the sedation medicines used during the test).    FOLLOW UP: Our staff will call the number listed on your records the next business day following your procedure to check on you and address any questions or concerns that you may have regarding the information given to you following your procedure. If we do not reach you, we will leave a message.  However, if you are feeling well and you are not experiencing any problems, there is no need to return our call.  We will assume that you have returned to your regular daily activities without incident.  If any biopsies were taken you will be contacted by phone or by letter within the next 1-3 weeks.  Please call us at [phone] if you have not heard about the biopsies in 3  weeks.    SIGNATURES/CONFIDENTIALITY: You and/or your care partner have signed paperwork which will be entered into your electronic medical record.  These signatures attest to the fact that that the information above on your After Visit Summary has been reviewed and is understood.  Full responsibility of the confidentiality of this discharge information lies with you and/or your care-partner.    Handouts were given to your care partner on polyps and diverticulosis. NO ASPIRIN, ASPIRIN CONTAINING PRODUCTS (BC OR GOODY POWDERS) OR NSAIDS (IBUPROFEN, ADVIL, ALEVE, AND  MOTRIN) FOR 2 weeks; TYLENOL IS OK TO TAKE.  You may begin aspirin 81 mg back July 24, 2017. You may resume your other current medications today. Await biopsy results. Please call if any questions or concerns.

## 2017-07-08 NOTE — Op Note (Addendum)
[facility] Patient Name: Emmaus Brandi Procedure Date: 07/08/2017 2:36 PM MRN: [id number] Endoscopist: Gatha Mayer , MD Age: 72 Referring MD:  Date of Birth: October 06, 1945 Gender: Male Account #: [id number] Procedure:                Colonoscopy Indications:              Screening for colorectal malignant neoplasm Medicines:                Propofol per Anesthesia, Monitored Anesthesia Care Procedure:                Pre-Anesthesia Assessment:                           - Prior to the procedure, a History and Physical                            was performed, and patient medications and                            allergies were reviewed. The patient's tolerance of                            previous anesthesia was also reviewed. The risks                            and benefits of the procedure and the sedation                            options and risks were discussed with the patient.                            All questions were answered, and informed consent                            was obtained. Prior Anticoagulants: The patient has                            taken no previous anticoagulant or antiplatelet                            agents. ASA Grade Assessment: III - A patient with                            severe systemic disease. After reviewing the risks                            and benefits, the patient was deemed in                            satisfactory condition to undergo the procedure.                           After obtaining informed consent, the colonoscope  was passed under direct vision. Throughout the                            procedure, the patient's blood pressure, pulse, and                            oxygen saturations were monitored continuously. The                            Colonoscope was introduced through the anus and                            advanced to the the cecum, identified by   appendiceal orifice and ileocecal valve. The                            colonoscopy was somewhat difficult due to                            significant looping. Successful completion of the                            procedure was aided by applying abdominal pressure.                            The patient tolerated the procedure well. The                            quality of the bowel preparation was good. The                            ileocecal valve, appendiceal orifice, and rectum                            were photographed. The bowel preparation used was                            Miralax. Scope In: 2:49:09 PM Scope Out: 3:13:36 PM Scope Withdrawal Time: 0 hours 15 minutes 25 seconds  Total Procedure Duration: 0 hours 24 minutes 27 seconds  Findings:                 The perianal and digital rectal examinations were                            normal.                           Two pedunculated polyps were found in the                            descending colon. The polyps were 10 to 12 mm in                            size. These polyps were removed with a hot snare.  Resection and retrieval were complete. Verification                            of patient identification for the specimen was                            done. Estimated blood loss: none.                           A 3 mm polyp was found in the transverse colon. The                            polyp was sessile. The polyp was removed with a                            cold snare. Resection and retrieval were complete.                            Verification of patient identification for the                            specimen was done. Estimated blood loss was minimal.                           Multiple diverticula were found in the sigmoid                            colon. There was narrowing of the colon in                            association with the diverticular opening.                            The exam was otherwise without abnormality on                            direct and retroflexion views. Complications:            No immediate complications. Estimated Blood Loss:     Estimated blood loss was minimal. Impression:               - Two 10 to 12 mm polyps in the descending colon,                            removed with a hot snare. Resected and retrieved.                           - One 3 mm polyp in the transverse colon, removed                            with a cold snare. Resected and retrieved.                           - Severe diverticulosis in  the sigmoid colon. There                            was narrowing of the colon in association with the                            diverticular opening.                           - The examination was otherwise normal on direct                            and retroflexion views. Recommendation:           - Patient has a contact number available for                            emergencies. The signs and symptoms of potential                            delayed complications were discussed with the                            patient. Return to normal activities tomorrow.                            Written discharge instructions were provided to the                            patient.                           - Resume previous diet.                           - Continue present medications.                           - No aspirin, ibuprofen, naproxen, or other                            non-steroidal anti-inflammatory drugs for 2 weeks                            after polyp removal.                           - Repeat colonoscopy is recommended. The                            colonoscopy date will be determined after pathology                            results from today's exam become available for                            review.  Need to cc [facility] also Gatha Mayer, MD 07/08/2017 3:26:25  PM This report has been signed electronically.

## 2017-07-09 ENCOUNTER — Telehealth: Payer: Self-pay | Admitting: *Deleted

## 2017-07-09 NOTE — Progress Notes (Signed)
Chief Complaint  Patient presents with  . Follow-up    currently on dialysis, 3xweek, started first of march    HPI: Jeremy Black 72 y.o. come in for   Fu multiple problem to touch base anad have [place] be involved in care    His reanal failure has progressed abd biw gas bee n ion hemodialysis   [facility]  For a month 3 x per week  working on getting his energy back we do  not have acess to records  He is tryin got keep [place] in loo[p   Nephrologist is dr Loraine Maple? Sp  Had colon and endo  tues   .  3 polyps  No other bleeding .  Looking for  Transplant .  To be placed on list  Getting extra iron via dialysis  Hg 7.6  Taking iron every other day .  Breathing some better  . To get better .   Has fu appt with urology  Nephrology , cardiology and  Neurology on next 2 months  And will be getting a new PCP in June ( [place]) at the [place] .    Off the gabapentin for now  Has continued  Right leg pain radiatin from  Buttocks  No falling ( hx of back surgery)   ROS: See pertinent positives and negatives per HPI. No fever pos weight loss  Cold intolerance no bleeding ocass light headed no falling     Past Medical History:  Diagnosis Date  . Allergy   . Arthritis   . Cataract   . Diabetes mellitus without complication ([place])   . ED (erectile dysfunction)   . GERD (gastroesophageal reflux disease)   . Hyperlipidemia   . Hypertension   . Normal nuclear stress test    myoview neg 12 09 except for HT  . Other and unspecified alcohol dependence, unspecified drinking behavior    date of last use 02/13/11  . Prostate cancer ([place]) 02/2017   stage t1c, low risk and asymptomatic followed at [facility]  . Proteinuria    past record showed 500mg  protein excretion per 24 hours eval by renal  . PTSD (post-traumatic stress disorder)    from [place] experience [place]  . [facility]    Family [facility]  Problem Relation Age of Onset  .  Diabetes Mother   . Hypertension Mother   . Other Mother        nerves  . Throat cancer Father        deceased  . Coronary artery disease Sister   . Diabetes Sister   . Heart disease Sister   . Hypertension Brother   . Alcohol abuse Paternal Aunt   . Alcohol abuse Cousin   . Drug abuse Son        cocaine dependent    Social History   Socioeconomic History  . Marital status: Legally Separated    Spouse name: Not on file  . Number of children: Not on file  . Years of education: Not on file  . Highest education level: Not on file  Occupational History  . 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 Financial resource strain: Not on file  . Food insecurity:    Worry: Not on file    Inability: Not on file  . Transportation needs:    Medical: Not on file    Non-medical: Not on file  Tobacco Use  . Smoking status: Former Smoker    Packs/day: 0.50    Years: 40.00    Pack years: 20.00    Types: Cigarettes    Last attempt to quit: 04/15/2009    Years since quitting: 8.2  . Smokeless tobacco: Never Used  Substance and Sexual Activity  . Alcohol use: Yes    Alcohol/week: 30.0 oz    Types: 50 Shots of liquor per week    Comment: Pt in treatment for alcohol dependence. Last date of use 02/13/11.,  Very very little 01/27/13  . Drug use: No  . Sexual activity: Not on file  Lifestyle  . Physical activity:    Days per week: Not on file    Minutes per session: Not on file  . Stress: Not on file  Relationships  . Social connections:    Talks on phone: Not on file    Gets together: Not on file    Attends religious service: Not on file    Active member of club or organization: Not on file    Attends meetings of clubs or organizations: Not on file    Relationship status: Not on file  Other Topics Concern  . 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   Working with disabled adults.    Plays golf weekly              Outpatient Medications Prior to Visit  Medication Sig Dispense Refill  . allopurinol (ZYLOPRIM) 100 MG tablet Take 100 mg by mouth daily.    Marland Kitchen amLODipine (NORVASC) 10 MG tablet Take 1 tablet (10 mg total) by mouth daily. 90 tablet 3  . atorvastatin (LIPITOR) 20 MG tablet Take 20 mg by mouth daily.    . carvedilol (COREG) 12.5 MG tablet Take 6.25 mg by mouth 2 (two) times daily with a meal.    . cetirizine (ZYRTEC) 10 MG tablet Take 10 mg by mouth daily.    . fluticasone (FLONASE) 50 MCG/ACT nasal spray Place 2 sprays into both nostrils 2 (two) times daily.    . furosemide (LASIX) 40 MG tablet Take 1-2 tablets (40-80 mg total) by mouth daily. Or as directed (Patient taking differently: Take 40 mg by mouth 2 (two) times daily. Take 1 tablet (40 mg) BID for 1 week. Then take 1 tablet (40 mg) daily) 30 tablet 0  . gabapentin (NEURONTIN) 100 MG capsule Take 100 mg by mouth 2 (two) times daily.    . hydrALAZINE (APRESOLINE) 50 MG tablet Take 50 mg by mouth 3 (three) times daily.     . hydroxypropyl methylcellulose / hypromellose (ISOPTO TEARS / GONIOVISC) 2.5 % ophthalmic solution Place 1 drop into both eyes 2 (two) times daily.    Marland Kitchen ketotifen (ZADITOR) 0.025 % ophthalmic solution Place 1 drop into both eyes 2 (two) times daily as needed.    . loratadine (CLARITIN) 10 MG tablet Take 10 mg by mouth at bedtime.    . montelukast (SINGULAIR) 10 MG tablet Take 10 mg by mouth daily.     Marland Kitchen  omeprazole (PRILOSEC) 20 MG capsule Take 20 mg by mouth daily. From the [place]    . polyethylene glycol (MIRALAX / GLYCOLAX) packet Take 17 g by mouth daily as needed.    . sodium bicarbonate 325 MG tablet Take 325 mg by mouth 2 (two)  times daily.    . tamsulosin (FLOMAX) 0.4 MG CAPS capsule Take 0.4 mg by mouth at bedtime.    . traZODone (DESYREL) 50 MG tablet Take 25 mg by mouth at bedtime as needed for sleep.     . valACYclovir (VALTREX) 1000 MG tablet TAKE 1 TABLET (1,000 MG TOTAL) BY MOUTH DAILY. OR AS DIRECTED 30 tablet 5  . vardenafil (LEVITRA) 20 MG tablet Take 20 mg by mouth daily as needed for erectile dysfunction.    . metFORMIN (GLUCOPHAGE) 500 MG tablet Take by mouth daily with breakfast.    . telmisartan (MICARDIS) 80 MG tablet Take 80 mg by mouth 2 (two) times daily.    Marland Kitchen torsemide (DEMADEX) 20 MG tablet Take 20 mg by mouth daily. Take 1/2 tablet daily    . triamterene-hydrochlorothiazide (MAXZIDE-25) 37.5-25 MG tablet Take 1 tablet by mouth daily.    . cephALEXin (KEFLEX) 250 MG capsule Take by mouth 2 (two) times daily.    Marland Kitchen HYDROCODONE-ACETAMINOPHEN PO Take 325 mg by mouth every 6 (six) hours as needed.     Facility-Administered Medications Prior to Visit  Medication Dose Route Frequency Provider Last Rate Last Dose  . 0.9 %  sodium chloride infusion  500 mL Intravenous Once Gatha Mayer, MD         EXAM:  BP (!) 120/46 (BP Location: Right Arm, Patient Position: Sitting, Cuff Size: Normal)   Pulse 78   Temp 98.4 F (36.9 C) (Oral)   Wt 198 lb 6.4 oz (90 kg)   BMI 28.47 kg/m   Body mass index is 28.47 kg/m.  GENERAL: vitals reviewed and listed above, alert, oriented, appears well hydrated and in no acute distress HEENT: atraumatic, conjunctiva  clear, no obvious abnormalities on inspection of external nose and ears  NECK: no obvious masses on inspection palpation  LUNGS: clear to auscultation bilaterally, no wheezes, rales or rhonchi, good air movement CV: HRRR, no clubbing cyanosis or  peripheral edema nl cap refill  MS: moves all extremities without noticeable focal  abnormality PSYCH: pleasant and cooperative, no obvious depression or anxiety no tremor  Lab Results  Component Value  Date   WBC 7.9 02/05/2017   HGB 9.0 (L) 02/05/2017   HCT 27.4 (L) 02/05/2017   PLT 249 02/05/2017   GLUCOSE 102 (H) 02/05/2017   CHOL 147 04/20/2014   TRIG 129.0 04/20/2014   HDL 42.20 04/20/2014   LDLDIRECT 66.7 01/31/2011   LDLCALC 79 04/20/2014   ALT 23 02/05/2017   AST 20 02/05/2017   NA 140 02/05/2017   K 4.1 02/05/2017   CL 108 02/05/2017   CREATININE 3.98 (H) 02/05/2017   BUN 32 (H) 02/05/2017   CO2 20 (L) 02/05/2017   TSH 0.76 07/09/2012   PSA 0.82 09/07/2009   HGBA1C 6.7 (H) 04/20/2014   MICROALBUR 141.5 (H) 09/14/2009   BP Readings from Last 3 Encounters:  07/10/17 (!) 120/46  07/08/17 (!) 155/67  06/03/17 (!) 154/68   Wt Readings from Last 3 Encounters:  07/10/17 198 lb 6.4 oz (90 kg)  07/08/17 207 lb (93.9 kg)  06/03/17 207 lb 6 oz (94.1 kg)   Total visit 30 mins > 50% spent counseling and coordinating care  as indicated in above note and in instructions to patient .    ASSESSMENT AND PLAN:  Discussed the following assessment and plan:  ESRD on dialysis ([place])  Chronic renal disease, stage IV (HCC)  Anemia in chronic kidney disease, unspecified CKD stage  Feels cold  DM type 2, controlled, with complication (HCC)  Right leg pain - prob scaitic radicular on going   Medication management dsic getting  Communications from his  Other care at [facility]  Maybe get on  My chart and  Let us know when he comes back in    Disc flomax and gabapentin use and benefit risk  -Patient advised to return or notify health care team  if  new concerns arise.  Patient Instructions   When you go to the cardiologist and  You primary care  At the [facility]    Or have lab tests    Done through their site  Then  Please bring [place] a copy ( it  Seems that  There is a great delay if trying to get info    from the   Doctors itself. )   May considier using the    Online access    With password.   And get some one to help you with this.       Ask you urologist  If the flonax is that helpful  for you .  Sometimes can cause  Light headed ness when arising  Upright .   Gabapentin is not a disease  Changing medication.    And can cause drowsiness and if not helpful for pain then wound not  take it.    Let us know how we can help but    Nephrology should help with renal failure and the anemia .     July  OV       Eeva Schlosser K. Rhenda [place] M.D.

## 2017-07-09 NOTE — Telephone Encounter (Signed)
  Follow up Call-  Call back number 07/08/2017  Post procedure Call Back phone  # [phone]  Permission to leave phone message Yes  Some recent data might be hidden     Patient questions:  Do you have a fever, pain , or abdominal swelling? No. Pain Score  0 *  Have you tolerated food without any problems? Yes.    Have you been able to return to your normal activities? Yes.    Do you have any questions about your discharge instructions: Diet   No. Medications  No. Follow up visit  No.  Do you have questions or concerns about your Care? No.  Actions: * If pain score is 4 or above: No action needed, pain <4.

## 2017-07-10 ENCOUNTER — Encounter: Payer: Self-pay | Admitting: Internal Medicine

## 2017-07-10 ENCOUNTER — Ambulatory Visit: Payer: Medicare Other | Admitting: Internal Medicine

## 2017-07-10 VITALS — BP 120/46 | HR 78 | Temp 98.4°F | Wt 198.4 lb

## 2017-07-10 DIAGNOSIS — N189 Chronic kidney disease, unspecified: Secondary | ICD-10-CM

## 2017-07-10 DIAGNOSIS — E118 Type 2 diabetes mellitus with unspecified complications: Secondary | ICD-10-CM | POA: Diagnosis not present

## 2017-07-10 DIAGNOSIS — N184 Chronic kidney disease, stage 4 (severe): Secondary | ICD-10-CM

## 2017-07-10 DIAGNOSIS — M79604 Pain in right leg: Secondary | ICD-10-CM | POA: Diagnosis not present

## 2017-07-10 DIAGNOSIS — Z992 Dependence on renal dialysis: Secondary | ICD-10-CM

## 2017-07-10 DIAGNOSIS — D631 Anemia in chronic kidney disease: Secondary | ICD-10-CM

## 2017-07-10 DIAGNOSIS — N186 End stage renal disease: Secondary | ICD-10-CM

## 2017-07-10 DIAGNOSIS — Z79899 Other long term (current) drug therapy: Secondary | ICD-10-CM

## 2017-07-10 DIAGNOSIS — R6889 Other general symptoms and signs: Secondary | ICD-10-CM | POA: Diagnosis not present

## 2017-07-10 DIAGNOSIS — R448 Other symptoms and signs involving general sensations and perceptions: Secondary | ICD-10-CM

## 2017-07-10 NOTE — Patient Instructions (Addendum)
  When you go to the cardiologist and  You primary care  At the [facility]    Or have lab tests    Done through their site  Then  Please bring [place] a copy ( it  Seems that  There is a great delay if trying to get info    from the   Doctors itself. )   May considier using the    Online access    With password.   And get some one to help you with this.       Ask you urologist  If the flonax is that helpful for you .  Sometimes can cause  Light headed ness when arising  Upright .   Gabapentin is not a disease  Changing medication.    And can cause drowsiness and if not helpful for pain then wound not  take it.    Let us know how we can help but    Nephrology should help with renal failure and the anemia .     July  OV

## 2017-07-10 NOTE — Addendum Note (Signed)
Addended by: Virl Cagey on: 07/10/2017 04:02 PM   Modules accepted: Orders

## 2017-07-15 ENCOUNTER — Encounter: Payer: Self-pay | Admitting: Internal Medicine

## 2017-07-15 DIAGNOSIS — Z86010 Personal history of colonic polyps: Secondary | ICD-10-CM

## 2017-07-15 DIAGNOSIS — Z860101 Personal history of adenomatous and serrated colon polyps: Secondary | ICD-10-CM

## 2017-07-15 HISTORY — DX: Personal history of colonic polyps: Z86.010

## 2017-07-15 HISTORY — DX: Personal history of adenomatous and serrated colon polyps: Z86.0101

## 2017-07-15 NOTE — Progress Notes (Signed)
3 adenomas and 2 10+ mm Recall 2022 My Chart letter Need to send a report the [facility] and [facility] also please

## 2017-09-16 ENCOUNTER — Ambulatory Visit (INDEPENDENT_AMBULATORY_CARE_PROVIDER_SITE_OTHER): Payer: Medicare Other

## 2017-09-16 VITALS — BP 100/50 | HR 66 | Ht 70.5 in | Wt 198.0 lb

## 2017-09-16 DIAGNOSIS — Z0000 Encounter for general adult medical examination without abnormal findings: Secondary | ICD-10-CM

## 2017-09-16 LAB — HEMOGLOBIN A1C: Hemoglobin A1C: 5.3

## 2017-09-16 NOTE — Progress Notes (Addendum)
Subjective:   Jeremy Black is a 72 y.o. male who presents for Medicare Annual (Subsequent) preventive examination.  Reports health as fair  Seen in March 2019  Hemodialysis tiw M-W-F  Feels about the same on his day off  Also rec's care at the [facility]  Nephrology following renal failure and anemia  Hospitalized 06/2016 and this is when he started dialysis   Married x 40 years and wife deceased  Recent marriage  x 4 years next month July 6th Has some hip issues  She has 3 dtr and he has 3 children We all get a long well  9 grands and she has 7 grands  Graduated from [employer]  Was a Runner, broadcasting/film/video in [place]  Agent orange DM and HTN; and now 4th stage kidney disease Prostate cancer now  7/23 [facility] going to freeze cancer cells  Planned on visiting her wife's children  Diet -appetite poor [facility] started on megace bid BMI 28  Supplements with nepro Abbott shake   loves sweets; he eats in moderation Dietician works with them  Vegetables  Salads, eats healthy   Regular exercise  Slowed down some due to gout and hip issues  Has not played golf this year Slow breathing now to rest when walking long distances   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 States they check A1c at [place] his BS at home 111;  Requested check so did poc and A1c 5.3   Educated regarding the shingrix Will take this at the [place]  Will obtain IMM records from the [place] to update his record   Cardiac Risk Factors include: advanced age (>22men, >39 women);diabetes mellitus;dyslipidemia;family history of premature cardiovascular disease;hypertension;male gender    Objective:     Vitals: BP (!) 100/50   Pulse 66   Ht 5' 10.5" (1.791 m)   Wt 198 lb (89.8 kg)   SpO2 98%   BMI 28.01 kg/m   Body mass index is 28.01 kg/m.  Advanced Directives 02/05/2017 10/18/2016 10/17/2016  09/18/2016 04/01/2016 05/04/2014 05/04/2014  Does Patient Have a Medical Advance Directive? No Yes Yes No No No No  Type of Advance Directive - Living will;Healthcare Power of [place];Living will - - - -  Does patient want to make changes to medical advance directive? - No - Patient declined - - - - -  Copy of [place] in Chart? - No - copy requested - - - - -  Would patient like information on creating a medical advance directive? Yes (ED - Information included in AVS) - - - - No - patient declined information No - patient declined information    Tobacco Social History   Tobacco Use  Smoking Status Former Smoker  . Packs/day: 0.50  . Years: 40.00  . Pack years: 20.00  . Types: Cigarettes  . Last attempt to quit: 04/15/2009  . Years since quitting: 8.4  Smokeless Tobacco Never Used     Counseling given: Yes   Clinical Intake:   Past Medical History:  Diagnosis Date  . Allergy   . Arthritis   . Cataract   . Diabetes mellitus without complication ([place])   . ED (erectile dysfunction)   . GERD (gastroesophageal reflux disease)   . Hx of adenomatous colonic polyps 07/15/2017  . Hyperlipidemia   .  Hypertension   . Normal nuclear stress test    myoview neg 12 09 except for HT  . Other and unspecified alcohol dependence, unspecified drinking behavior    date of last use 02/13/11  . Prostate cancer ([place]) 02/2017   stage t1c, low risk and asymptomatic followed at [facility]  . Proteinuria    past record showed 500mg  protein excretion per 24 hours eval by renal  . PTSD (post-traumatic stress disorder)    from [place] experience [place]  . S/P dilatation [facility] stricture    [facility]   Past Surgical History:  Procedure Laterality Date  . ls spinal surgery    . NM MYOVIEW LTD  12/09   neg except for HT  . PROSTATE BIOPSY  2018   stage t1c cancer  . TRIGGER FINGER RELEASE Left 10/18/2016   Procedure: LEFT TRIGGER THUMB RELEASE;   Surgeon: Milly Jakob, MD;  Location: [facility];  Service: Orthopedics;  Laterality: Left;   Family History  Problem Relation Age of Onset  . Diabetes Mother   . Hypertension Mother   . Other Mother        nerves  . Throat cancer Father        deceased  . Coronary artery disease Sister   . Diabetes Sister   . Heart disease Sister   . Hypertension Brother   . Alcohol abuse Paternal Aunt   . Alcohol abuse Cousin   . Drug abuse Son        cocaine dependent   Social History   Socioeconomic History  . Marital status: Legally Separated    Spouse name: Not on file  . Number of children: Not on file  . Years of education: Not on file  . Highest education level: Not on file  Occupational History  . 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 Financial resource strain: Not on file  . Food insecurity:    Worry: Not on file    Inability: Not on file  . Transportation needs:    Medical: Not on file    Non-medical: Not on file  Tobacco Use  . Smoking status: Former Smoker    Packs/day: 0.50    Years: 40.00    Pack years: 20.00    Types: Cigarettes    Last attempt to quit: 04/15/2009    Years since quitting: 8.4  . Smokeless tobacco: Never Used  Substance and Sexual Activity  . Alcohol use: Yes    Alcohol/week: 30.0 oz    Types: 50 Shots of liquor per week    Comment: Pt in treatment for alcohol dependence. Last date of use 02/13/11.,  Very very little 01/27/13  . Drug use: No  . Sexual activity: Not on file  Lifestyle  . Physical activity:    Days per week: Not on file    Minutes per session: Not on file  . Stress: Not on file  Relationships  . Social connections:    Talks on phone: Not on file    Gets together: Not on file    Attends religious service: Not on file    Active member of club or organization: Not on file    Attends meetings of clubs or organizations: Not on file    Relationship status: Not on file  Other Topics  Concern  . 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   Working with disabled adults.    Plays golf weekly              Outpatient Encounter Medications as of 09/16/2017  Medication Sig  . allopurinol (ZYLOPRIM) 100 MG tablet Take 100 mg by mouth daily.  Marland Kitchen amLODipine (NORVASC) 10 MG tablet Take 1 tablet (10 mg total) by mouth daily.  . carvedilol (COREG) 12.5 MG tablet Take 6.25 mg by mouth 2 (two) times daily with a meal.  . cetirizine (ZYRTEC) 10 MG tablet Take 10 mg by mouth daily.  . furosemide (LASIX) 40 MG tablet Take 1-2 tablets (40-80 mg total) by mouth daily. Or as directed (Patient taking differently: Take 40 mg by mouth 2 (two) times daily. Take 1 tablet (40 mg) BID for 1 week. Then take 1 tablet (40 mg) daily)  . gabapentin (NEURONTIN) 100 MG capsule Take 100 mg by mouth 2 (two) times daily.  . hydrALAZINE (APRESOLINE) 50 MG tablet Take 50 mg by mouth 3 (three) times daily.   . hydroxypropyl methylcellulose / hypromellose (ISOPTO TEARS / GONIOVISC) 2.5 % ophthalmic solution Place 1 drop into both eyes 2 (two) times daily.  Marland Kitchen ketotifen (ZADITOR) 0.025 % ophthalmic solution Place 1 drop into both eyes 2 (two) times daily as needed.  . loratadine (CLARITIN) 10 MG tablet Take 10 mg by mouth at bedtime.  . Megestrol Acetate (MEGACE ORAL PO) Take by mouth.  . montelukast (SINGULAIR) 10 MG tablet Take 10 mg by mouth daily.   Marland Kitchen omeprazole (PRILOSEC) 20 MG capsule Take 20 mg by mouth daily. From the [place]  . sodium bicarbonate 325 MG tablet Take 325 mg by mouth 2 (two) times daily.  . tamsulosin (FLOMAX) 0.4 MG CAPS capsule Take 0.4 mg by mouth at bedtime.  .  traZODone (DESYREL) 50 MG tablet Take 25 mg by mouth at bedtime as needed for sleep.   . valACYclovir (VALTREX) 1000 MG tablet TAKE 1 TABLET (1,000 MG TOTAL) BY MOUTH DAILY. OR AS DIRECTED  . vardenafil (LEVITRA) 20 MG tablet Take 20 mg by mouth daily as needed for erectile dysfunction.  . fluticasone (FLONASE) 50 MCG/ACT nasal spray Place 2 sprays into both nostrils 2 (two) times daily.   Facility-Administered Encounter Medications as of 09/16/2017  Medication  . 0.9 %  sodium chloride infusion    Activities of Daily Living In your present state of health, do you have any difficulty performing the following activities: 09/16/2017 10/18/2016  Hearing? Y Y  Comment - bil hearing aids left at home  Vision? N N  Difficulty concentrating or making decisions? N N  Comment deferred to neurologist  -  Walking or climbing stairs? N N  Dressing or bathing? N N  Doing errands, shopping? N -  Preparing Food and eating ? N -  Using the Toilet? N -  In the past six months, have you accidently leaked urine? Y -  Comment prostate surgery  -  Do you have problems with loss of bowel control? Y -  Comment some constipation  -  Managing your Medications? N -  Managing your Finances? N -  Housekeeping or managing  your Housekeeping? N -  Some recent data might be hidden    Patient Care Team: Panosh, Standley Brooking, MD as PCP - General Corliss Parish, MD (Nephrology) Psychiatrist and PCP at the [facility], MD as Consulting Physician (Neurosurgery) Vanita Ingles, MD as Consulting Physician (Cardiology) Verline Lema, MD as Consulting Physician (Internal Medicine)    UR Dr. Felicie M[facility] at [facility] is following his prostate cancer  Dr. Patrice Paradise is neurologist checking memory status (at [facility])  Assessment:   This is a routine wellness examination for [place].  Exercise Activities and Dietary recommendations Current Exercise Habits: Home exercise routine, Exercise limited by: Other - see  comments(due to dialysis )  Goals    . Patient Stated     Set a goal to get a kidney !       Fall Risk Fall Risk  09/16/2017 09/18/2016 06/06/2016 05/16/2016 05/04/2014  Falls in the past year? No No No No No  Risk for fall due to : - - Other (Comment) - Other (Comment)    Depression Screen PHQ 2/9 Scores 09/16/2017 09/18/2016 06/06/2016 05/16/2016  PHQ - 2 Score 1 0 0 0  Exception Documentation - - Other- indicate reason in comment box -     Cognitive Function MMSE - Mini Mental State Exam 09/16/2017 09/18/2016  Not completed: (No Data) -  Orientation to time - 5  Orientation to Place - 5  Registration - 3  Attention/ Calculation - 5  Recall - 1  Language- name 2 objects - 2  Language- repeat - 1  Language- follow 3 step command - 3  Language- read & follow direction - 1  Write a sentence - 1  Copy design - 0  Total score - 27    states his neurologist did this x 2 weeks ago  At the [place]  Dr. Patrice Paradise   next apt is the 11/06/2017  Immunization History  Administered Date(s) Administered  . Hepatitis B 08/06/2005  . Influenza Split 01/21/2011, 01/13/2014  . Influenza Whole 02/08/2009, 01/11/2010  . Influenza, High Dose Seasonal PF 01/13/2017  . Influenza-Unspecified 12/14/2012  . Pneumococcal Conjugate-13 09/14/2013, 01/25/2014  . Td 12/07/2002, 04/20/2014     Screening Tests Health Maintenance  Topic Date Due  . FOOT EXAM  10/10/2017 (Originally 02/23/2014)  . PNA vac Low Risk Adult (2 of 2 - PPSV23) 10/16/2017 (Originally 01/26/2015)  . HEMOGLOBIN A1C  10/13/2018 (Originally 10/19/2014)  . INFLUENZA VACCINE  11/13/2017  . OPHTHALMOLOGY EXAM  04/30/2018  . COLON CANCER SCREENING ANNUAL FOBT  07/09/2018  . COLONOSCOPY  07/08/2020  . TETANUS/TDAP  04/20/2024         Plan:      PCP Notes   Health Maintenance Going to podiatrist Monday June 10th, as well as UR at the [place]; Podiatry at the [place] and dialysis  Will update his foot exam   Not sure when A1c was drawn but [employer]. Has been good but requested to check today  Checks his BS at home 111;  A1c today 5.3   Educated regarding the shingrix Will take this at the [place]  Will obtain IMM records from the [place] to update his record    Abnormal Screens  none  Referrals  none  Patient concerns; He has prostate cancer and apt with  7/23 [facility] going to freeze cancer cells   Is being reviewed for possible transplant candidate once his prostate cancer treatments are complete   Nurse Concerns; UR  [facility] Felic[facility] at [facility] is [facility] his [facility] cancer  Dr. Patrice Paradise is neurologist checking memory status (at [facility])   [place] attitude; tired just started on Megace from the [place]   Next PCP apt 11/11/2017   I have personally reviewed and noted the following in the patient's chart:   . Medical and social history . Use of alcohol, tobacco or illicit drugs  . Current medications and supplements . Functional ability and status . Nutritional status . Physical activity . Advanced directives . List of other physicians . Hospitalizations, surgeries, and ER visits in previous 12 months . Vitals . Screenings to include cognitive, depression, and falls . Referrals and appointments  In addition, I have reviewed and discussed with patient certain preventive protocols, quality metrics, and best practice recommendations. A written personalized care plan for preventive services as well as general preventive health recommendations were provided to patient.     WYOVZ,CHYIF, RN  09/16/2017  I have reviewed the documentation for the [facility] and [place] provided by the health coach and agree with their documentation. I was immediately available for any questions  Eulas Post MD [facility] at [facility]

## 2017-09-16 NOTE — Patient Instructions (Addendum)
Jeremy Black , Thank you for taking time to come for your Medicare Wellness Visit. I appreciate your ongoing commitment to your health goals. Please review the following plan we discussed and let me know if I can assist you in the future.   [facility] to see if they did an AAA Men Ages 25 to 21 Years who Have Ever Smoked  The USPSTF recommends one-time screening for abdominal aortic aneurysm (AAA) with ultrasonography in men ages 64 to 57 years who have ever smoked.   Please confirm with the [facility] is giving you the puemovax (PSV 23) q 5 years. See if you can get your IMM record from the [place] at your next visit and we will update your record.   Will try to get your last A1c as well, so we can update the record   Educated to check with insurance regarding coverage of Shingles vaccination on Part D or Part B and may have lower co-pay if provided on the Part D side Shingrix is a vaccine for the prevention of Shingles in Adults 50 and older.  If you are on Medicare, the shingrix is covered under your Part D plan, so you will take both of the vaccines in the series at your pharmacy. Please check with your benefits regarding applicable copays or out of pocket expenses.  The Shingrix is given in 2 vaccines approx 8 weeks apart. You must receive the 2nd dose prior to 6 months from receipt of the first. Please have the pharmacist print out you Immunization  dates for our office records   Decided to check an A1c POC here at [facility]   These are the goals we discussed: Goals    . Patient Stated     Set a goal to get a kidney !       This is a list of the screening recommended for you and due dates:  Health Maintenance  Topic Date Due  . Complete foot exam   10/10/2017*  . Pneumonia vaccines (2 of 2 - PPSV23) 10/16/2017*  . Hemoglobin A1C  10/13/2018*  . Flu Shot  11/13/2017  . Eye exam for diabetics  04/30/2018  . Stool Blood Test  07/09/2018  . Colon Cancer Screening  07/08/2020  .  Tetanus Vaccine  04/20/2024  *Topic was postponed. The date shown is not the original due date.      Fall Prevention in the Home Falls can cause injuries. They can happen to people of all ages. There are many things you can do to make your home safe and to help prevent falls. What can I do on the outside of my home?  Regularly fix the edges of walkways and driveways and fix any cracks.  Remove anything that might make you trip as you walk through a door, such as a raised step or threshold.  Trim any bushes or trees on the path to your home.  Use bright outdoor lighting.  Clear any walking paths of anything that might make someone trip, such as rocks or tools.  Regularly check to see if handrails are loose or broken. Make sure that both sides of any steps have handrails.  Any raised decks and porches should have guardrails on the edges.  Have any leaves, snow, or ice cleared regularly.  Use sand or salt on walking paths during winter.  Clean up any spills in your garage right away. This includes oil or grease spills. What can I do in  the bathroom?  Use night lights.  Install grab bars by the toilet and in the tub and shower. Do not use towel bars as grab bars.  Use non-skid mats or decals in the tub or shower.  If you need to sit down in the shower, use a plastic, non-slip stool.  Keep the floor dry. Clean up any water that spills on the floor as soon as it happens.  Remove soap buildup in the tub or shower regularly.  Attach bath mats securely with double-sided non-slip rug tape.  Do not have throw rugs and other things on the floor that can make you trip. What can I do in the bedroom?  Use night lights.  Make sure that you have a light by your bed that is easy to reach.  Do not use any sheets or blankets that are too big for your bed. They should not hang down onto the floor.  Have a firm chair that has side arms. You can use this for support while you get  dressed.  Do not have throw rugs and other things on the floor that can make you trip. What can I do in the kitchen?  Clean up any spills right away.  Avoid walking on wet floors.  Keep items that you use a lot in easy-to-reach places.  If you need to reach something above you, use a strong step stool that has a grab bar.  Keep electrical cords out of the way.  Do not use floor polish or wax that makes floors slippery. If you must use wax, use non-skid floor wax.  Do not have throw rugs and other things on the floor that can make you trip. What can I do with my stairs?  Do not leave any items on the stairs.  Make sure that there are handrails on both sides of the stairs and use them. Fix handrails that are broken or loose. Make sure that handrails are as long as the stairways.  Check any carpeting to make sure that it is firmly attached to the stairs. Fix any carpet that is loose or worn.  Avoid having throw rugs at the top or bottom of the stairs. If you do have throw rugs, attach them to the floor with carpet tape.  Make sure that you have a light switch at the top of the stairs and the bottom of the stairs. If you do not have them, ask someone to add them for you. What else can I do to help prevent falls?  Wear shoes that: ? Do not have high heels. ? Have rubber bottoms. ? Are comfortable and fit you well. ? Are closed at the toe. Do not wear sandals.  If you use a stepladder: ? Make sure that it is fully opened. Do not climb a closed stepladder. ? Make sure that both sides of the stepladder are locked into place. ? Ask someone to hold it for you, if possible.  Clearly mark and make sure that you can see: ? Any grab bars or handrails. ? First and last steps. ? Where the edge of each step is.  Use tools that help you move around (mobility aids) if they are needed. These include: ? Canes. ? Walkers. ? Scooters. ? Crutches.  Turn on the lights when you go into a  dark area. Replace any light bulbs as soon as they burn out.  Set up your furniture so you have a clear path. Avoid moving your furniture around.  If any of your floors are uneven, fix them.  If there are any pets around you, be aware of where they are.  Review your medicines with your doctor. Some medicines can make you feel dizzy. This can increase your chance of falling. Ask your doctor what other things that you can do to help prevent falls. This information is not intended to replace advice given to you by your health care provider. Make sure you discuss any questions you have with your health care provider. Document Released: 01/26/2009 Document Revised: 09/07/2015 Document Reviewed: 05/06/2014 Elsevier Interactive Patient Education  2018 [place] Maintenance, Male A healthy lifestyle and preventive care is important for your health and wellness. Ask your health care provider about what schedule of regular examinations is right for you. What should I know about weight and diet? Eat a Healthy Diet  Eat plenty of vegetables, fruits, whole grains, low-fat dairy products, and lean protein.  Do not eat a lot of foods high in solid fats, added sugars, or salt.  Maintain a Healthy Weight Regular exercise can help you achieve or maintain a healthy weight. You should:  Do at least 150 minutes of exercise each week. The exercise should increase your heart rate and make you sweat (moderate-intensity exercise).  Do strength-training exercises at least twice a week.  Watch Your Levels of Cholesterol and Blood Lipids  Have your blood tested for lipids and cholesterol every 5 years starting at 72 years of age. If you are at high risk for heart disease, you should start having your blood tested when you are 72 years old. You may need to have your cholesterol levels checked more often if: ? Your lipid or cholesterol levels are high. ? You are older than 72 years of age. ? You  are at high risk for heart disease.  What should I know about cancer screening? Many types of cancers can be detected early and may often be prevented. Lung Cancer  You should be screened every year for lung cancer if: ? You are a current smoker who has smoked for at least 30 years. ? You are a former smoker who has quit within the past 15 years.  Talk to your health care provider about your screening options, when you should start screening, and how often you should be screened.  Colorectal Cancer  Routine colorectal cancer screening usually begins at 72 years of age and should be repeated every 5-10 years until you are 72 years old. You may need to be screened more often if early forms of precancerous polyps or small growths are found. Your health care provider may recommend screening at an earlier age if you have risk factors for colon cancer.  Your health care provider may recommend using home test kits to check for hidden blood in the stool.  A small camera at the end of a tube can be used to examine your colon (sigmoidoscopy or colonoscopy). This checks for the earliest forms of colorectal cancer.  Prostate and Testicular Cancer  Depending on your age and overall health, your health care provider may do certain tests to screen for prostate and testicular cancer.  Talk to your health care provider about any symptoms or concerns you have about testicular or prostate cancer.  Skin Cancer  Check your skin from head to toe regularly.  Tell your health care provider about any new moles or changes in moles, especially if: ? There is a change in a mole's size,  shape, or color. ? You have a mole that is larger than a pencil eraser.  Always use sunscreen. Apply sunscreen liberally and repeat throughout the day.  Protect yourself by wearing long sleeves, pants, a wide-brimmed hat, and sunglasses when outside.  What should I know about heart disease, diabetes, and high blood  pressure?  If you are 55-44 years of age, have your blood pressure checked every 3-5 years. If you are 62 years of age or older, have your blood pressure checked every year. You should have your blood pressure measured twice-once when you are at a hospital or clinic, and once when you are not at a hospital or clinic. Record the average of the two measurements. To check your blood pressure when you are not at a hospital or clinic, you can use: ? An automated blood pressure machine at a pharmacy. ? A home blood pressure monitor.  Talk to your health care provider about your target blood pressure.  If you are between 37-80 years old, ask your health care provider if you should take aspirin to prevent heart disease.  Have regular diabetes screenings by checking your fasting blood sugar level. ? If you are at a normal weight and have a low risk for diabetes, have this test once every three years after the age of 37. ? If you are overweight and have a high risk for diabetes, consider being tested at a younger age or more often.  A one-time screening for abdominal aortic aneurysm (AAA) by ultrasound is recommended for men aged 83-75 years who are current or former smokers. What should I know about preventing infection? Hepatitis B If you have a higher risk for hepatitis B, you should be screened for this virus. Talk with your health care provider to find out if you are at risk for hepatitis B infection. Hepatitis C Blood testing is recommended for:  Everyone born from [birth year] through 1965.  Anyone with known risk factors for hepatitis C.  Sexually Transmitted Diseases (STDs)  You should be screened each year for STDs including gonorrhea and chlamydia if: ? You are sexually active and are younger than 72 years of age. ? You are older than 72 years of age and your health care provider tells you that you are at risk for this type of infection. ? Your sexual activity has changed since you were last  screened and you are at an increased risk for chlamydia or gonorrhea. Ask your health care provider if you are at risk.  Talk with your health care provider about whether you are at high risk of being infected with HIV. Your health care provider may recommend a prescription medicine to help prevent HIV infection.  What else can I do?  Schedule regular health, dental, and eye exams.  Stay current with your vaccines (immunizations).  Do not use any tobacco products, such as cigarettes, chewing tobacco, and e-cigarettes. If you need help quitting, ask your health care provider.  Limit alcohol intake to no more than 2 drinks per day. One drink equals 12 ounces of beer, 5 ounces of wine, or 1 ounces of hard liquor.  Do not use street drugs.  Do not share needles.  Ask your health care provider for help if you need support or information about quitting drugs.  Tell your health care provider if you often feel depressed.  Tell your health care provider if you have ever been abused or do not feel safe at home. This information is not intended  to replace advice given to you by your health care provider. Make sure you discuss any questions you have with your health care provider. Document Released: 09/28/2007 Document Revised: 11/29/2015 Document Reviewed: 01/03/2015 Elsevier Interactive Patient Education  [employer].

## 2017-09-19 ENCOUNTER — Ambulatory Visit: Payer: Medicare Other

## 2017-11-10 NOTE — Progress Notes (Signed)
Chief Complaint  Patient presents with  . Follow-up    No new concerns.    HPI: Jeremy Black 72 y.o. come in for Chronic disease management  Update her with wife   ESRD:  on dialysis   esrd .     Thursday . Last dialysis  No complications     No heart andnew  breathing symtoms  Sees cards every 6 months   Has anemia if chronic  disease feels cold a lot no bleeding   Vision hearing ok stable  PA  :PCP at the [place]  :  None assigned at this time but saw PA     Consider walker   None falling.     But has ligiht headedness and unsteadiness when first arises and then ok .   Prostate  Surgery   For cryotherapy  Tuesday  Last week  At [facility]  And to have fu for prostate cancer  Back on flomax  After stopping.   Sees neuro : memory ok  Stable taking namenda no all days?   gets down want to be able to go back hunting g( ground and bow)   Still driving self  And has to think about where going path but no getting lost or other   Problematic memory problem    ROS: See pertinent positives and negatives per HPI.  Past Medical History:  Diagnosis Date  . Allergy   . Arthritis   . Cataract   . Diabetes mellitus without complication ([place])   . ED (erectile dysfunction)   . GERD (gastroesophageal reflux disease)   . Hx of adenomatous colonic polyps 07/15/2017  . Hyperlipidemia   . Hypertension   . Normal nuclear stress test    myoview neg 12 09 except for HT  . Other and unspecified alcohol dependence, unspecified drinking behavior    date of last use 02/13/11  . Prostate cancer (Jeremy Black) 02/2017   stage t1c, low risk and asymptomatic followed at [facility]  . Proteinuria    past record showed 500mg  protein excretion per 24 hours eval by renal  . PTSD (post-traumatic stress disorder)    from [place] experience [place]  . [facility]    Family History  Problem Relation Age of Onset  . Diabetes Mother   . Hypertension Mother   . Other Mother    nerves  . Throat cancer Father        deceased  . Coronary artery disease Sister   . Diabetes Sister   . Heart disease Sister   . Hypertension Brother   . Alcohol abuse Paternal Aunt   . Alcohol abuse Cousin   . Drug abuse Son        cocaine dependent    Social History   Socioeconomic History  . Marital status: Legally Separated    Spouse name: Not on file  . Number of children: Not on file  . Years of education: Not on file  . Highest education level: Not on file  Occupational History  . 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 Financial resource strain: Not on file  . Food insecurity:    Worry: Not on file    Inability: Not on file  . Transportation needs:    Medical: Not on file    Non-medical: Not on file  Tobacco Use  . Smoking status: Former Smoker    Packs/day: 0.50  Years: 40.00    Pack years: 20.00    Types: Cigarettes    Last attempt to quit: 04/15/2009    Years since quitting: 8.5  . Smokeless tobacco: Never Used  Substance and Sexual Activity  . Alcohol use: Yes    Alcohol/week: 30.0 oz    Types: 50 Shots of liquor per week    Comment: Pt in treatment for alcohol dependence. Last date of use 02/13/11.,  Very very little 01/27/13  . Drug use: No  . Sexual activity: Not on file  Lifestyle  . Physical activity:    Days per week: Not on file    Minutes per session: Not on file  . Stress: Not on file  Relationships  . Social connections:    Talks on phone: Not on file    Gets together: Not on file    Attends religious service: Not on file    Active member of club or organization: Not on file    Attends meetings of clubs or organizations: Not on file    Relationship status: Not on file  Other Topics Concern  . 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   Working with disabled adults.    Plays golf weekly              Outpatient Medications Prior to Visit  Medication Sig Dispense Refill  . allopurinol (ZYLOPRIM) 100 MG tablet Take 100 mg by mouth daily.    Marland Kitchen amLODipine (NORVASC) 10 MG tablet Take 1 tablet (10 mg total) by mouth daily. 90 tablet 3  . carvedilol (COREG) 12.5 MG tablet Take 6.25 mg by mouth 2 (two) times daily with a meal.    . cetirizine (ZYRTEC) 10 MG tablet Take 10 mg by mouth daily.    Marland Kitchen ethyl chloride spray Apply 1 application topically as needed (MWF prn).    . fluticasone (FLONASE) 50 MCG/ACT nasal spray Place 2 sprays into both nostrils 2 (two) times daily.    . furosemide (LASIX) 40 MG tablet Take 1-2 tablets (40-80 mg total) by mouth daily. Or as directed (Patient taking differently: Take 40 mg by mouth 2 (two) times daily. Take 1 tablet (40 mg) BID for 1 week. Then take 1 tablet (40 mg) daily) 30 tablet 0  . hydrALAZINE (APRESOLINE) 50 MG tablet Take 50 mg by mouth 3 (three) times daily.     . hydroxypropyl methylcellulose / hypromellose (ISOPTO TEARS / GONIOVISC) 2.5 % ophthalmic solution Place 1 drop into both eyes 2 (two) times daily.    Marland Kitchen ipratropium (ATROVENT) 0.03 % nasal spray Place 2 sprays into both nostrils every 12 (twelve) hours.    Marland Kitchen ketotifen (ZADITOR) 0.025 % ophthalmic solution Place 1 drop into both eyes 2 (two) times daily as needed.    . lidocaine-prilocaine (EMLA) cream Apply 1 application topically as needed (M, W, F prior to Dialysis).    Marland Kitchen loratadine (CLARITIN) 10 MG tablet Take 10 mg by mouth at bedtime.    . Megestrol Acetate (MEGACE ORAL PO) Take by mouth.    Marland Kitchen  memantine (NAMENDA) 10 MG tablet Take by mouth. Take 1/2 tab x 2 weeks, then 1 tab x 2 weeks, then 1.5 tab x 2 weeks then 2 tab daily to continue.    . montelukast (SINGULAIR)  10 MG tablet Take 10 mg by mouth daily.     . tamsulosin (FLOMAX) 0.4 MG CAPS capsule Take 0.4 mg by mouth at bedtime.    . traZODone (DESYREL) 50 MG tablet Take 25 mg by mouth at bedtime as needed for sleep.     Marland Kitchen gabapentin (NEURONTIN) 100 MG capsule Take 100 mg by mouth 2 (two) times daily.    Marland Kitchen omeprazole (PRILOSEC) 20 MG capsule Take 20 mg by mouth daily. From the [place]    . sodium bicarbonate 325 MG tablet Take 325 mg by mouth 2 (two) times daily.    . valACYclovir (VALTREX) 1000 MG tablet TAKE 1 TABLET (1,000 MG TOTAL) BY MOUTH DAILY. OR AS DIRECTED (Patient not taking: Reported on 11/11/2017) 30 tablet 5  . vardenafil (LEVITRA) 20 MG tablet Take 20 mg by mouth daily as needed for erectile dysfunction.     Facility-Administered Medications Prior to Visit  Medication Dose Route Frequency Provider Last Rate Last Dose  . 0.9 %  sodium chloride infusion  500 mL Intravenous Once Gatha Mayer, MD         EXAM:  BP 122/62 (BP Location: Right Arm, Patient Position: Sitting, Cuff Size: Normal)   Pulse (!) 54   Temp 98.1 F (36.7 C) (Oral)   Wt 196 lb 11.2 oz (89.2 kg)   BMI 27.82 kg/m   Body mass index is 27.82 kg/m.  GENERAL: vitals reviewed and listed above, alert, oriented, appears well hydrated and in no acute distress HEENT: atraumatic, conjunctiva  clear, no obvious abnormalities on inspection of external nose and ears   NECK: no obvious masses on inspection palpation  LUNGS: clear to auscultation bilaterally, no wheezes, rales or rhonchi, good air movement CV: HRRR, no clubbing cyanosis or  peripheral edema nl cap refill  MS: moves all extremities without noticeable focal  abnormality PSYCH: pleasant and cooperative, nl speech memory not tested   Gait  Steady btu  cautios with moving turning  Feels somewhat light headed   bp when standing  98/50 pulse about 86 rr  righ arm  Lab Results  Component Value Date   WBC 7.9 02/05/2017   HGB 9.0 (L) 02/05/2017   HCT 27.4 (L)  02/05/2017   PLT 249 02/05/2017   GLUCOSE 102 (H) 02/05/2017   CHOL 147 04/20/2014   TRIG 129.0 04/20/2014   HDL 42.20 04/20/2014   LDLDIRECT 66.7 01/31/2011   LDLCALC 79 04/20/2014   ALT 23 02/05/2017   AST 20 02/05/2017   NA 140 02/05/2017   K 4.1 02/05/2017   CL 108 02/05/2017   CREATININE 3.98 (H) 02/05/2017   BUN 32 (H) 02/05/2017   CO2 20 (L) 02/05/2017   TSH 0.76 07/09/2012   PSA 0.82 09/07/2009   HGBA1C 5.3 09/16/2017   MICROALBUR 141.5 (H) 09/14/2009   BP Readings from Last 3 Encounters:  11/11/17 122/62  09/16/17 (!) 100/50  07/10/17 (!) 120/46    ASSESSMENT AND PLAN:  Discussed the following assessment and plan:  Hypotension, unspecified hypotension type  Type 2 diabetes mellitus with diabetic nephropathy, unspecified whether long term insulin use (HCC)  ESRD (end stage renal disease) on dialysis [facility])  Essential hypertension  Prostate cancer (HCC) esrd  On dialysis   Blood eval as  per specialists  And stable per report .   Have  Cards and renal and pcp  Team decide on adjuring meds to help avoid the orthostatics as possible   And to reduce the risk of falling .   Encouraged   Safe and alternative activities .  For optimum psychological health   [facility] care  ( no records sharing  Defaulted ) but med list updated .   Help with coordination of care and   safety net care to help until gets new pcp at [place].  ROV in 6 months disc fall prevention etc .  -Patient advised to return or notify health care team  if  new concerns arise. Total visit 34mins > 50% spent counseling and coordinating care as indicated in above note and in instructions to patient .     Patient Instructions  Your   Blood pressure  Drops when you stand and that is causing you the light headedness .   Some of you medications can contribute and  Discuss this with your specialists to decide if adjustments should be made.   Hydralazine,  tamsulozin    Other  meds for BP are  Amlodipine and  carvedilol  ( diuretic lasix)     Your BP with standing   98 /50  Today and could be adding to your  Lightheadedness .  Agree with walker .  Safe and fun  Think of things you can do   .   Get outside .        Standley Brooking. Yobani Schertzer M.D.

## 2017-11-11 ENCOUNTER — Ambulatory Visit (INDEPENDENT_AMBULATORY_CARE_PROVIDER_SITE_OTHER): Payer: Medicare Other | Admitting: Internal Medicine

## 2017-11-11 ENCOUNTER — Encounter: Payer: Self-pay | Admitting: Internal Medicine

## 2017-11-11 VITALS — BP 122/62 | HR 54 | Temp 98.1°F | Wt 196.7 lb

## 2017-11-11 DIAGNOSIS — N186 End stage renal disease: Secondary | ICD-10-CM

## 2017-11-11 DIAGNOSIS — I959 Hypotension, unspecified: Secondary | ICD-10-CM | POA: Diagnosis not present

## 2017-11-11 DIAGNOSIS — E1121 Type 2 diabetes mellitus with diabetic nephropathy: Secondary | ICD-10-CM | POA: Diagnosis not present

## 2017-11-11 DIAGNOSIS — I10 Essential (primary) hypertension: Secondary | ICD-10-CM | POA: Diagnosis not present

## 2017-11-11 DIAGNOSIS — Z992 Dependence on renal dialysis: Secondary | ICD-10-CM | POA: Insufficient documentation

## 2017-11-11 DIAGNOSIS — C61 Malignant neoplasm of prostate: Secondary | ICD-10-CM

## 2017-11-11 HISTORY — DX: Dependence on renal dialysis: Z99.2

## 2017-11-11 HISTORY — DX: End stage renal disease: N18.6

## 2017-11-11 NOTE — Patient Instructions (Signed)
Your   Blood pressure  Drops when you stand and that is causing you the light headedness .   Some of you medications can contribute and  Discuss this with your specialists to decide if adjustments should be made.   Hydralazine,  tamsulozin    Other  meds for BP are  Amlodipine and carvedilol  ( diuretic lasix)     Your BP with standing   98 /50  Today and could be adding to your  Lightheadedness .  Agree with walker .  Safe and fun  Think of things you can do   .   Get outside .

## 2017-12-30 NOTE — Progress Notes (Signed)
Chief Complaint  Patient presents with  . Follow-up    Pt had procedure for Prostate cancer in July at [facility] - cancer is gone. Pt is awaiting kidney transplant    HPI: Jeremy Black 72 y.o. come in for   Referral for renal transplant evaluation  Here with wife today  He gets dialysis and nephrology care  From [place]?     And recently had cry therapy for  Prostate cancer   In august and released from the urologist as  Felt to be resolved and  Any fu can be done from pcp .  H e advised cleared to go for transplant evaluation  Eating ok no new heart lung or neuro sx .  ROS: See pertinent positives and negatives per HPI.  Past Medical History:  Diagnosis Date  . Allergy   . Arthritis   . Cataract   . Diabetes mellitus without complication ([place])   . ED (erectile dysfunction)   . GERD (gastroesophageal reflux disease)   . Hx of adenomatous colonic polyps 07/15/2017  . Hyperlipidemia   . Hypertension   . Normal nuclear stress test    myoview neg 12 09 except for HT  . Other and unspecified alcohol dependence, unspecified drinking behavior    date of last use 02/13/11  . Prostate cancer ([place]) 02/2017   stage t1c, low risk and asymptomatic followed at [facility]  . Proteinuria    past record showed 500mg  protein excretion per 24 hours eval by renal  . PTSD (post-traumatic stress disorder)    from [place] experience [place]  . [facility]    Family [facility]  Problem Relation Age of Onset  . Diabetes Mother   . Hypertension Mother   . Other Mother        nerves  . Throat cancer Father        deceased  . Coronary artery disease Sister   . Diabetes Sister   . Heart disease Sister   . Hypertension Brother   . Alcohol abuse Paternal Aunt   . Alcohol abuse Cousin   . Drug abuse Son        cocaine dependent    Social History   Socioeconomic History  . Marital status: Legally Separated    Spouse name: Not on file  . Number of children:  Not on file  . Years of education: Not on file  . Highest education level: Not on file  Occupational History  . 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 Financial resource strain: Not on file  . Food insecurity:    Worry: Not on file    Inability: Not on file  . Transportation needs:    Medical: Not on file    Non-medical: Not on file  Tobacco Use  . Smoking status: Former Smoker    Packs/day: 0.50    Years: 40.00    Pack years: 20.00    Types: Cigarettes    Last attempt to quit: 04/15/2009    Years since quitting: 8.7  . Smokeless tobacco: Never Used  Substance and Sexual Activity  . Alcohol use: Yes    Alcohol/week: 50.0 standard drinks    Types: 50 Shots of liquor per week    Comment: Pt in treatment for alcohol dependence. Last date of use 02/13/11.,  Very very little 01/27/13  . Drug use: No  . Sexual activity: Not on file  Lifestyle  . Physical activity:    Days per week: Not on file    Minutes per session: Not on file  . Stress: Not on file  Relationships  . Social connections:    Talks on phone: Not on file    Gets together: Not on file    Attends religious service: Not on file    Active member of club or organization: Not on file    Attends meetings of clubs or organizations: Not on file    Relationship status: Not on file  Other Topics Concern  . 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   Working with disabled adults.    Playedgolf  Hunting in past           Outpatient Medications Prior to Visit  Medication Sig Dispense Refill  . allopurinol (ZYLOPRIM) 100 MG tablet Take 100 mg by mouth daily.    Marland Kitchen amLODipine (NORVASC) 10 MG tablet Take 1 tablet  (10 mg total) by mouth daily. 90 tablet 3  . carvedilol (COREG) 12.5 MG tablet Take 6.25 mg by mouth 2 (two) times daily with a meal.    . cetirizine (ZYRTEC) 10 MG tablet Take 10 mg by mouth daily.    . fluticasone (FLONASE) 50 MCG/ACT nasal spray Place 2 sprays into both nostrils 2 (two) times daily.    . furosemide (LASIX) 40 MG tablet Take 1-2 tablets (40-80 mg total) by mouth daily. Or as directed (Patient taking differently: Take 40 mg by mouth 2 (two) times daily. Take 1 tablet (40 mg) BID for 1 week. Then take 1 tablet (40 mg) daily) 30 tablet 0  . hydrALAZINE (APRESOLINE) 50 MG tablet Take 50 mg by mouth 3 (three) times daily.     . hydroxypropyl methylcellulose / hypromellose (ISOPTO TEARS / GONIOVISC) 2.5 % ophthalmic solution Place 1 drop into both eyes 2 (two) times daily.    Marland Kitchen ipratropium (ATROVENT) 0.03 % nasal spray Place 2 sprays into both nostrils every 12 (twelve) hours.    Marland Kitchen ketotifen (ZADITOR) 0.025 % ophthalmic solution Place 1 drop into both eyes 2 (two) times daily as needed.    . loratadine (CLARITIN) 10 MG tablet Take 10 mg by mouth at bedtime.    . Megestrol Acetate (MEGACE ORAL PO) Take by mouth.    . memantine (NAMENDA) 10 MG tablet Take by mouth. Take 1/2 tab x 2 weeks, then 1 tab x 2 weeks, then 1.5 tab x 2 weeks then 2 tab daily to continue.    . montelukast (SINGULAIR) 10 MG tablet Take 10 mg by mouth daily.     . tamsulosin (FLOMAX) 0.4 MG CAPS capsule Take 0.4 mg by mouth at bedtime.    . traZODone (DESYREL) 50 MG tablet Take 25 mg by mouth at bedtime as needed for sleep.     Marland Kitchen ethyl chloride spray Apply 1 application topically as needed (MWF prn).    . lidocaine-prilocaine (EMLA) cream Apply 1 application topically as needed (M, W, F prior to Dialysis).     Facility-Administered Medications Prior to Visit  Medication Dose Route Frequency  Provider Last Rate Last Dose  . 0.9 %  sodium chloride infusion  500 mL Intravenous Once Gatha Mayer, MD          EXAM:  BP (!) 122/50 (BP Location: Right Arm, Patient Position: Sitting, Cuff Size: Normal)   Pulse 62   Temp 98.2 F (36.8 C) (Oral)   Wt 197 lb (89.4 kg)   BMI 27.87 kg/m   Body mass index is 27.87 kg/m.  GENERAL: vitals reviewed and listed above, alert, oriented, appears well hydrated and in no acute distress HEENT: atraumatic, conjunctiva  clear, no obvious abnormalities on inspection of external nose and ears NECK: no obvious masses on inspection palpation  MS: moves all extremities without noticeable focal  Abnormality left arm shunt in place  PSYCH: pleasant and cooperative, no obvious depression or anxiety cognition appears intact   BP Readings from Last 3 Encounters:  12/31/17 (!) 122/50  11/11/17 122/62  09/16/17 (!) 100/50   Wt Readings from Last 3 Encounters:  12/31/17 197 lb (89.4 kg)  11/11/17 196 lb 11.2 oz (89.2 kg)  09/16/17 198 lb (89.8 kg)     ASSESSMENT AND PLAN:  Discussed the following assessment and plan:  ESRD (end stage renal disease) on dialysis ([place]) - proceed with eval for renal transplant  Need for influenza vaccination - Plan: Flu vaccine HIGH DOSE PF (Fluzone High dose)  History of prostate cancer - treated with cryo and deemed clear to proceed with transplant from urology   Type 2 diabetes mellitus with diabetic nephropathy, unspecified whether long term insulin use ([place]) Prostate cancer felt to be remitted and gone per urology note   And no need for fu at specialist .   PCP requested to do referral to transplant team as the nephrologist is  In [place] system ? And  Referral needs to be out side referral  . Other optinos are [facility] in [place] system .  Flu vaccine today   Total visit 66mins > 50% spent counseling and coordinating care as indicated in above note and in instructions to patient .  -Patient advised to return or notify health care team  if  new concerns arise.  Patient Instructions  Glad you are doing ok .  Will work on  referral to renal transplant team  At [place] planned  [facility] has a good surgical medical community and resources .   Flu vaccine today   Ok to get PSA at  [facility] .   Or here   But not currently needed     Standley Brooking. Tynell Winchell M.D.

## 2017-12-31 ENCOUNTER — Encounter: Payer: Self-pay | Admitting: Internal Medicine

## 2017-12-31 ENCOUNTER — Ambulatory Visit (INDEPENDENT_AMBULATORY_CARE_PROVIDER_SITE_OTHER): Payer: Medicare Other | Admitting: Internal Medicine

## 2017-12-31 VITALS — BP 122/50 | HR 62 | Temp 98.2°F | Wt 197.0 lb

## 2017-12-31 DIAGNOSIS — N186 End stage renal disease: Secondary | ICD-10-CM

## 2017-12-31 DIAGNOSIS — Z23 Encounter for immunization: Secondary | ICD-10-CM

## 2017-12-31 DIAGNOSIS — E1121 Type 2 diabetes mellitus with diabetic nephropathy: Secondary | ICD-10-CM | POA: Diagnosis not present

## 2017-12-31 DIAGNOSIS — Z8546 Personal history of malignant neoplasm of prostate: Secondary | ICD-10-CM

## 2017-12-31 DIAGNOSIS — Z992 Dependence on renal dialysis: Secondary | ICD-10-CM

## 2017-12-31 NOTE — Patient Instructions (Addendum)
Glad you are doing ok .  Will work on referral to renal transplant team  At [place] planned  [facility] has a good surgical medical community and resources .   Flu vaccine today   Ok to get PSA at  [facility] .   Or here   But not currently needed

## 2018-05-07 ENCOUNTER — Encounter: Payer: Self-pay | Admitting: Internal Medicine

## 2018-05-07 LAB — HM DIABETES EYE EXAM

## 2018-05-14 ENCOUNTER — Ambulatory Visit: Payer: Medicare Other | Admitting: Internal Medicine

## 2018-06-23 ENCOUNTER — Encounter: Payer: Self-pay | Admitting: Internal Medicine

## 2018-09-04 ENCOUNTER — Encounter: Payer: Self-pay | Admitting: Internal Medicine

## 2018-09-18 ENCOUNTER — Telehealth: Payer: Self-pay | Admitting: Internal Medicine

## 2018-09-18 NOTE — Telephone Encounter (Signed)
Copied from [place] [phone]. Topic: Quick Communication - See Telephone Encounter >> Sep 18, 2018  3:20 PM Berneta Levins wrote: CRM for notification. See Telephone encounter for: 09/18/18.  Pt called, he wants to make sure he is on the kidney transplant list.  States he is a dialysis patient at the [place]. Pt can be reached at [phone]

## 2018-09-21 NOTE — Telephone Encounter (Signed)
lvm for pt to call back pt needs to call the [place] where he is getting treatment for this. CRM created

## 2018-09-22 ENCOUNTER — Ambulatory Visit: Payer: Medicare Other

## 2018-10-05 ENCOUNTER — Other Ambulatory Visit: Payer: Self-pay | Admitting: *Deleted

## 2018-10-05 DIAGNOSIS — Z20822 Contact with and (suspected) exposure to covid-19: Secondary | ICD-10-CM

## 2018-10-11 LAB — NOVEL CORONAVIRUS, NAA: SARS-CoV-2, NAA: NOT DETECTED

## 2018-10-21 IMAGING — CT CT RENAL STONE PROTOCOL
2 of 4 series · 15 of 46 positions shown, 17 images · non-contrast
Comparison: July 20, 2014

CLINICAL DATA: Left flank pain radiating into left inguinal region

EXAM:
CT ABDOMEN AND PELVIS WITHOUT CONTRAST
TECHNIQUE: Multidetector CT imaging of the abdomen and pelvis was performed
following the standard protocol without oral or intravenous contrast
maternal administration.

[Series 2: stone study 5.0 i30f 1 · axial · 0.84mm/px · z∈[-472,-77]mm · 12 of 87 slices shown, 14 images]
[im 4/87  soft-tissue]
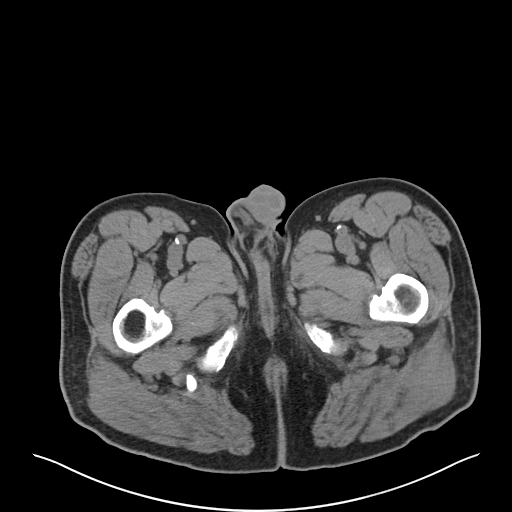
[im 4/87  bone]
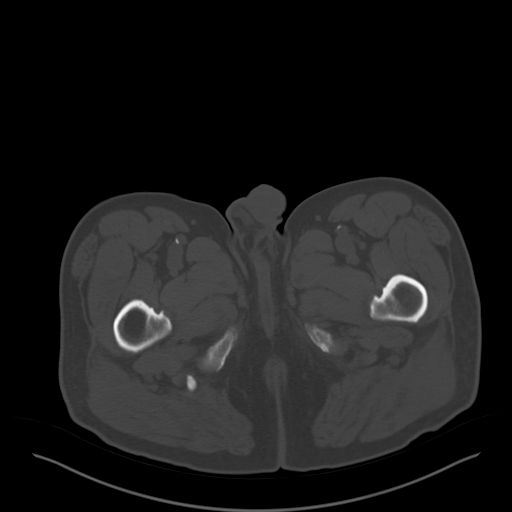
[im 11/87  soft-tissue]
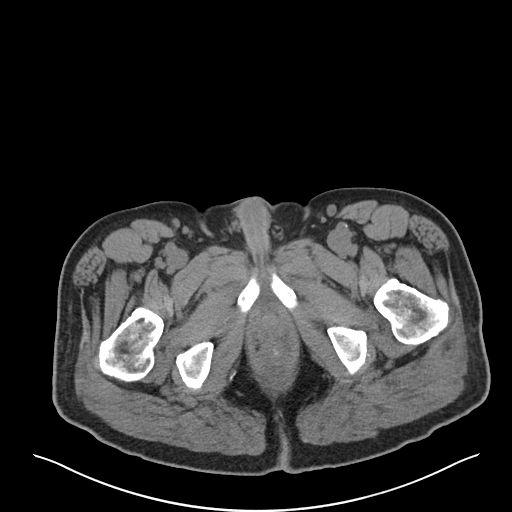
[im 18/87  soft-tissue]
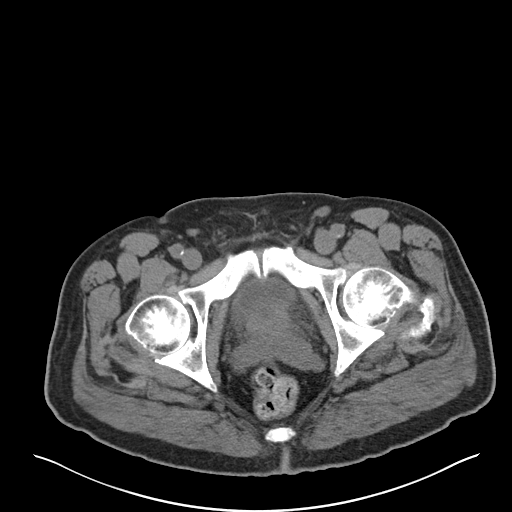
[im 26/87  soft-tissue]
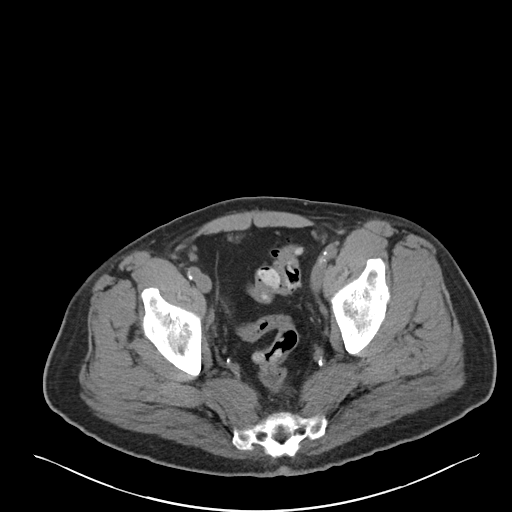
[im 33/87  soft-tissue]
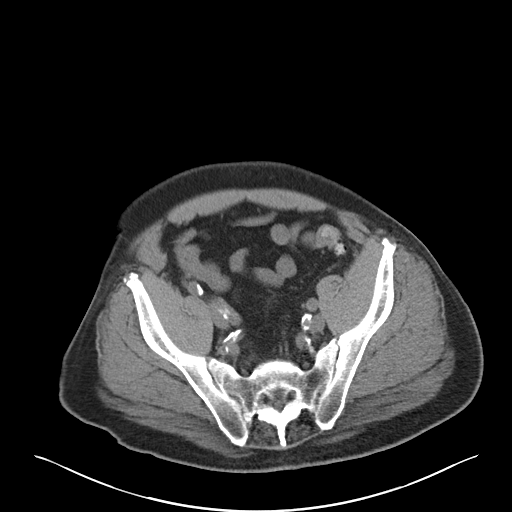
[im 40/87  soft-tissue]
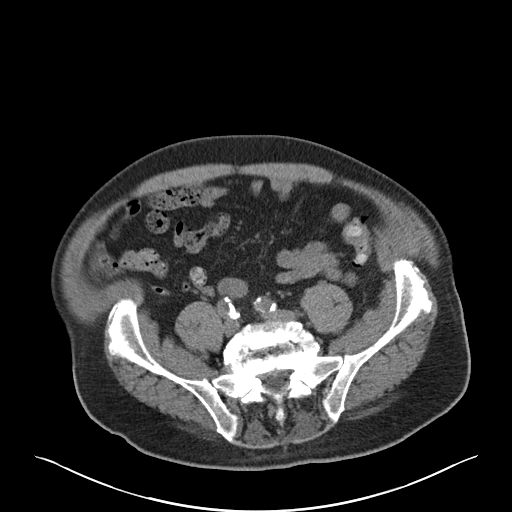
[im 47/87  soft-tissue]
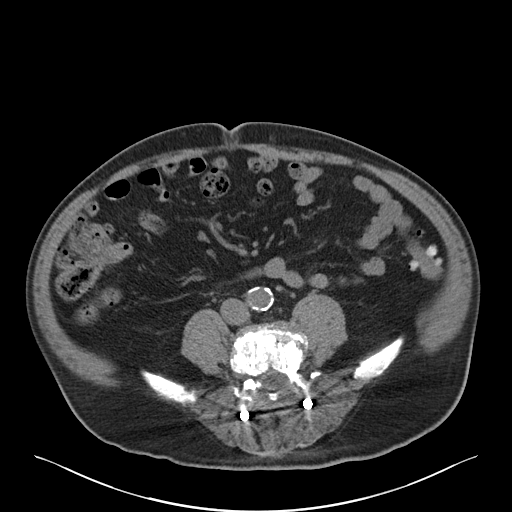
[im 54/87  soft-tissue]
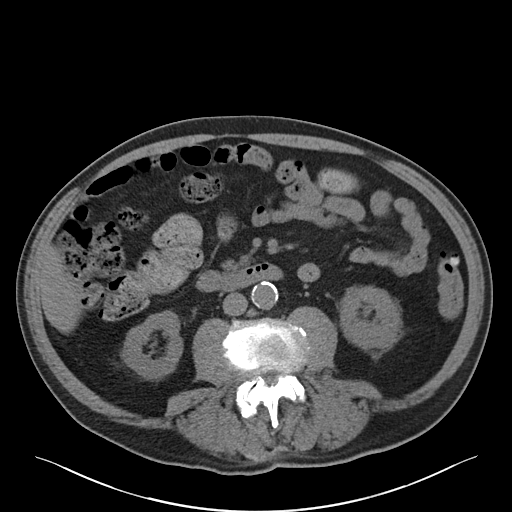
[im 61/87  soft-tissue]
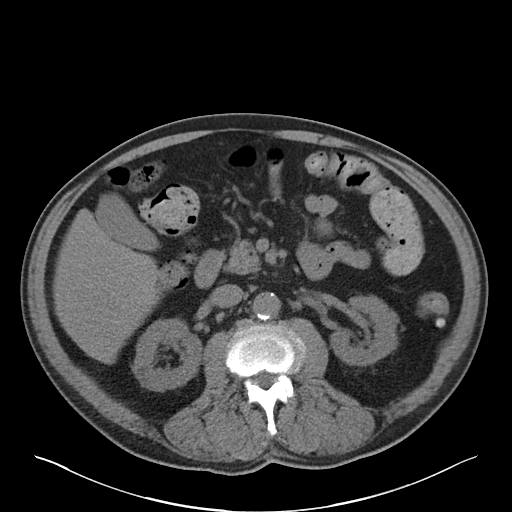
[im 61/87  bone]
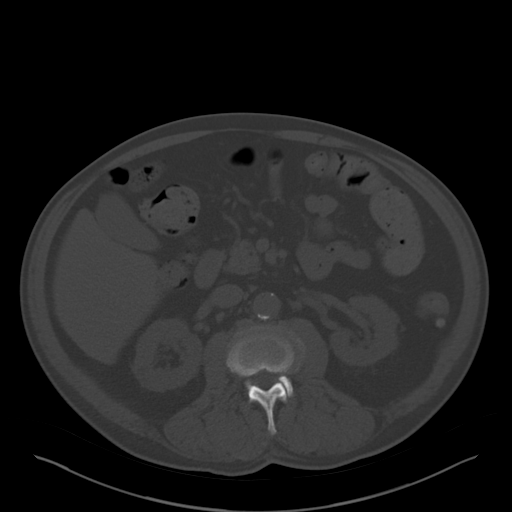
[im 69/87  soft-tissue]
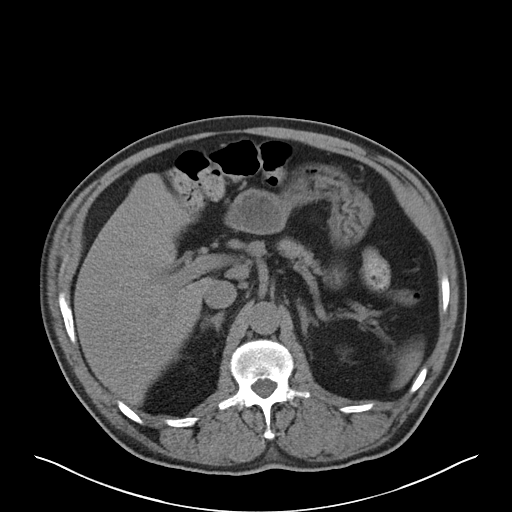
[im 76/87  soft-tissue]
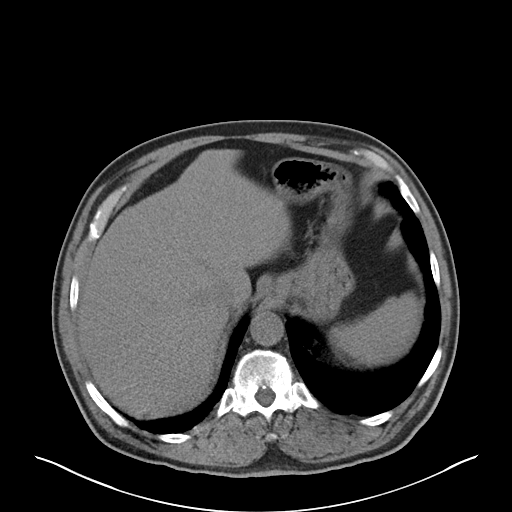
[im 83/87  soft-tissue]
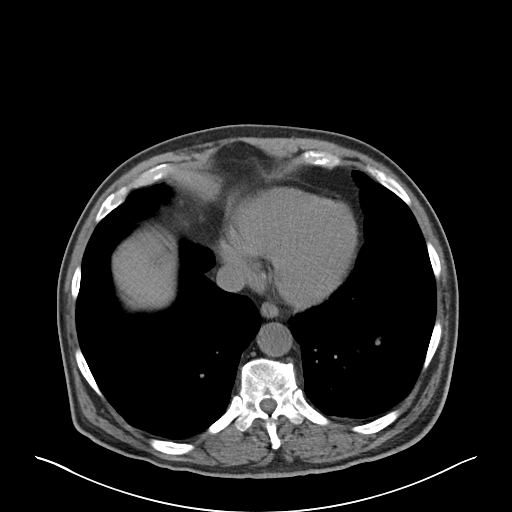

[Series 5: coronal soft tissue · coronal · 0.77mm/px · 3 of 96 slices shown]
[im 32/96  soft-tissue]
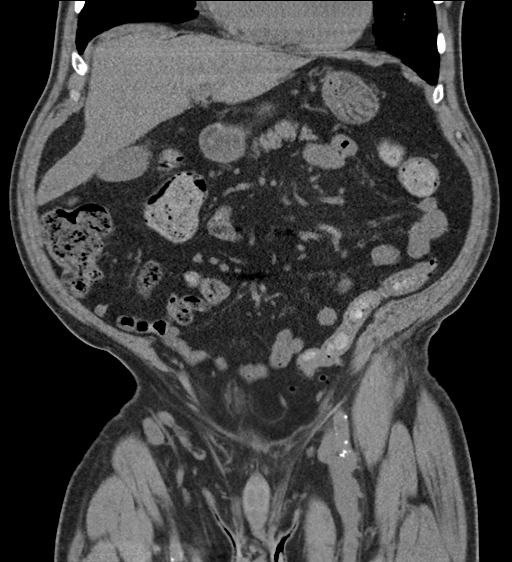
[im 43/96  soft-tissue]
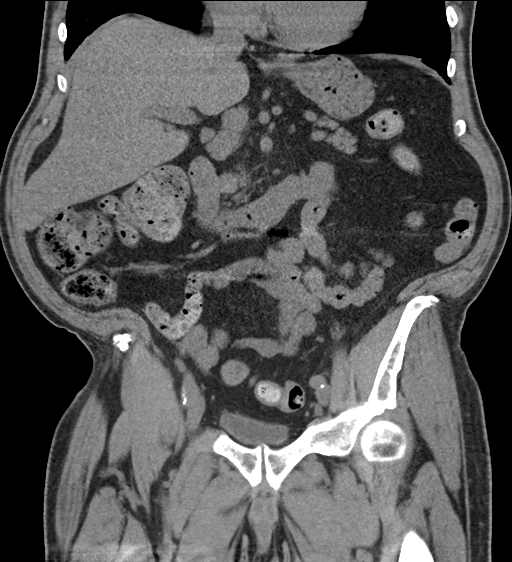
[im 53/96  soft-tissue]
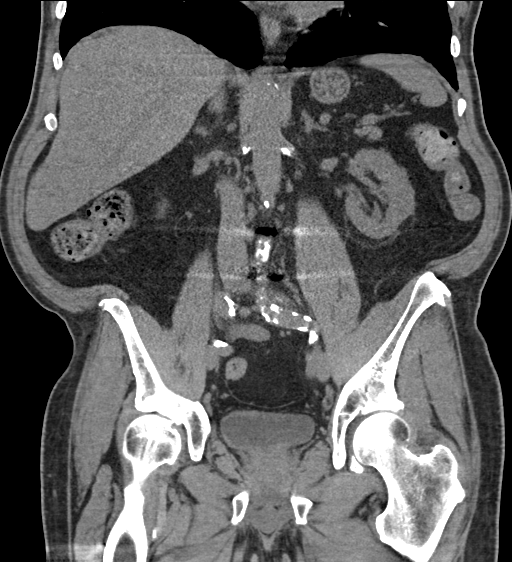

[15 of 46 positions shown; findings below may reference images not displayed]

FINDINGS: Lower chest: Lung bases are clear. There are foci of coronary artery
calcification evident.

Hepatobiliary: No focal liver lesions are appreciable on this
noncontrast enhanced study. There is no appreciable gallbladder wall
thickening. There is no appreciable biliary duct dilatation.

Pancreas: There is no pancreatic mass or inflammatory focus.

Spleen: No splenic lesions are evident.

Adrenals/Urinary Tract: Adrenals appear normal bilaterally. There is
a cyst arising from the lateral aspect of the upper pole of the
right kidney measuring 3.1 x 2.4 cm. There is a cyst arising from
the anterior mid left kidney measuring 1.0 x 0.8 cm. There is a 6 mm
cyst arising from the periphery of the lower pole the right kidney
laterally. There is no appreciable hydronephrosis on either side.
There is no renal or ureteral calculus on either side. Urinary
bladder is midline with wall thickness within normal limits.

Stomach/Bowel: There are scattered colonic diverticula in the
descending colon and sigmoid regions without diverticulitis. There
is no bowel wall or mesenteric thickening on this study. No bowel
obstruction. No free air or portal venous air.

Vascular/Lymphatic: There is atherosclerotic calcification in the
aorta and iliac arteries. There is moderate calcification in each
proximal renal artery with what appears to be hemodynamically
significant obstruction in each proximal renal artery. There is no
mesenteric vessel occlusion is seen on this noncontrast enhanced
study. There is no evident adenopathy in the abdomen or pelvis.

Reproductive: There are scattered prostatic calculi. Prostate and
seminal vesicles appear within normal limits in size and contour. No
evident pelvic mass.

Other: Appendix appears unremarkable. There is no abscess or ascites
in the abdomen or pelvis. There is a small ventral hernia containing
only fat.

Musculoskeletal: There is postoperative change with pedicle screw
placement at L4 and L5 bilaterally. There is degenerative change in
the lower lumbar spine. There are no blastic or lytic bone lesions.
There is no intramuscular or abdominal wall lesion.
IMPRESSION: 1. No renal or ureteral calculus evident. No hydronephrosis. Urinary
bladder wall is not appreciably thickened.

2. Extensive aortoiliac atherosclerosis. There appears to be
hemodynamically significant obstruction due to calcification in each
proximal renal artery. In this regard, question whether patient is
hypertensive due to renovascular factors. Foci of coronary artery
calcification also noted.

3. Left colonic diverticulosis without diverticulitis. No bowel
obstruction. No abscess. Appendix appears normal.

4.  Several prostatic calculi noted.

5.  Postoperative change lower lumbar spine.

6.  Small ventral hernia containing only fat.

Aortic Atherosclerosis ([id number]-671.1).

## 2018-10-22 ENCOUNTER — Ambulatory Visit: Payer: Medicare Other

## 2018-12-07 IMAGING — DX DG CHEST 2V
2 series · 2 of 2 positions shown · non-contrast
Comparison: 03/03/2014

CLINICAL DATA: Cough, congestion and dyspnea for 2 weeks, history
hypertension, diabetes mellitus, former smoker

EXAM:
CHEST  2 VIEW

[chest pa]
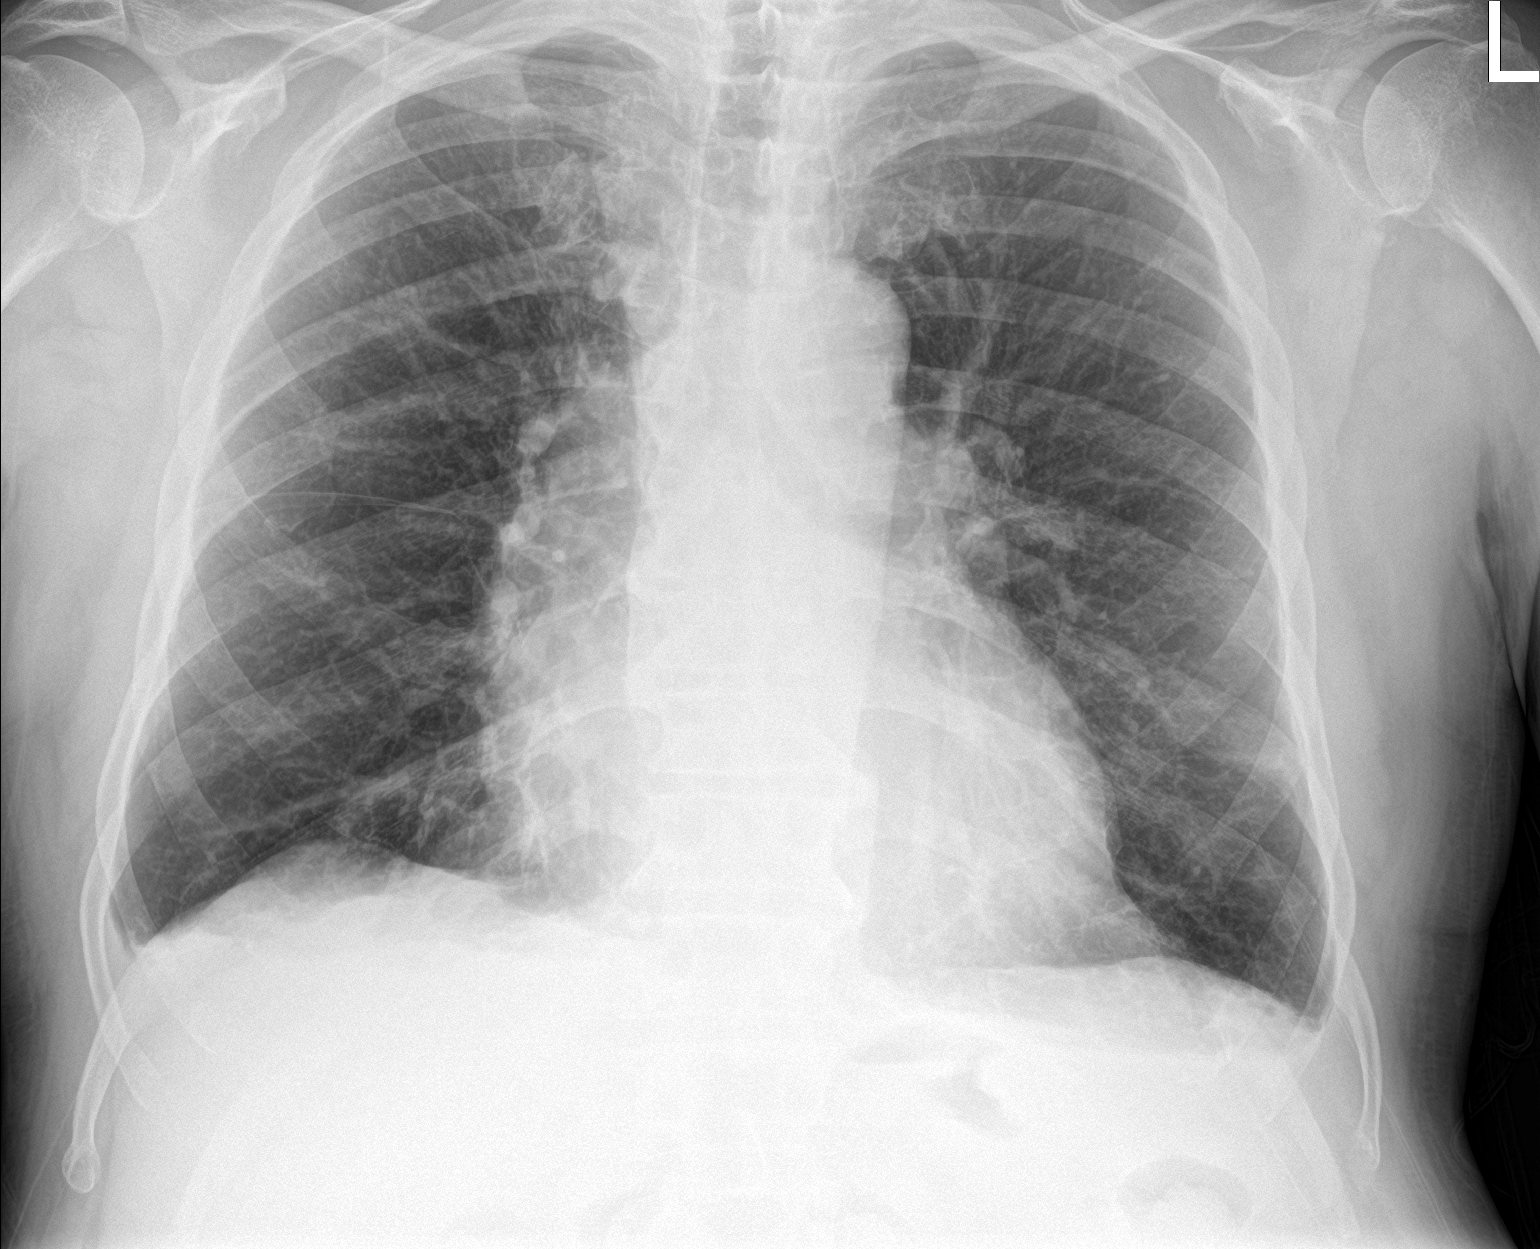

[chest lat]
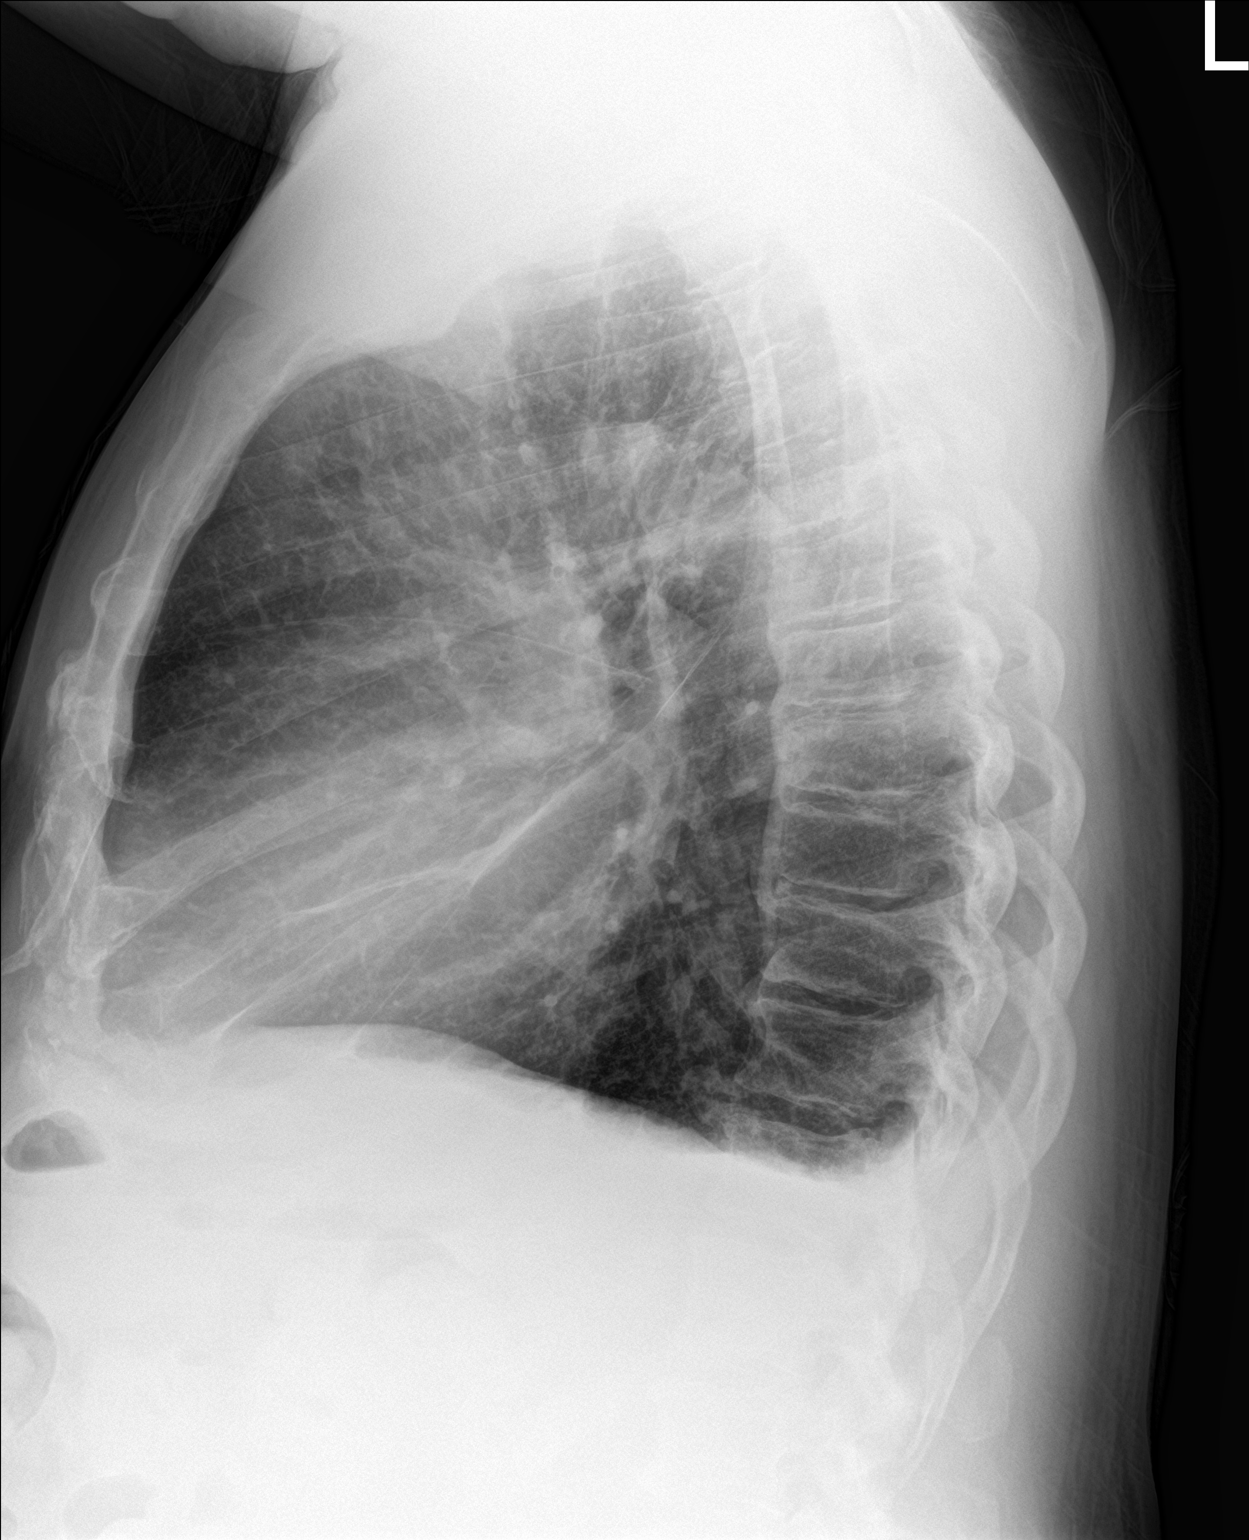

[2 of 2 positions shown; findings below may reference images not displayed]

FINDINGS: Borderline enlargement of cardiac silhouette.

Mediastinal contours and pulmonary vascularity normal.

Small bibasilar pleural effusions and minimal atelectasis.

Upper lungs clear.

No pleural effusion or pneumothorax.

Scattered endplate spur formation thoracic spine.
IMPRESSION: Small bibasilar pleural effusions and atelectasis without definite
acute infiltrate.

## 2019-01-05 ENCOUNTER — Other Ambulatory Visit: Payer: Self-pay

## 2019-01-05 ENCOUNTER — Encounter: Payer: Self-pay | Admitting: Family Medicine

## 2019-01-05 ENCOUNTER — Telehealth (INDEPENDENT_AMBULATORY_CARE_PROVIDER_SITE_OTHER): Payer: Medicare Other | Admitting: Family Medicine

## 2019-01-05 DIAGNOSIS — Z0000 Encounter for general adult medical examination without abnormal findings: Secondary | ICD-10-CM | POA: Diagnosis not present

## 2019-01-05 NOTE — Progress Notes (Signed)
Medicare Annual Preventive Care Visit  (initial annual wellness or annual wellness exam)  Virtual Visit via Video Note  I connected with Jeremy Black  on 09/01/18 at  3:20 PM EDT by a video enabled telemedicine application and verified that I am speaking with the correct person using two identifiers.  Location patient: home Location provider:work or home office Persons participating in the virtual visit: patient, provider  Concerns and/or follow up today:  PMH significant for diabetes, HTN, HLD, prostate ca, ESRD. Followed by PCP and specialists for these issues. He also see the [facility] in [place] for primary care. He reports he does his labs there.  He is currently on dialysis. He has just been put on the transplant list and will be doing transplant classes at [facility]. He sees a cardiologist as well through the [place]. Reports his kidney doctor manages his BP.  See HM section in Epic for other details of completed HM. See scanned documentation under Media Tab for further documentation HPI, health risk assessment. See Media Tab and Care Teams sections in Epic for other providers.  ROS: negative for report of fevers, unintentional weight loss, vision changes, vision loss, hearing loss or change, chest pain, sob, hemoptysis, melena, hematochezia, hematuria, genital discharge or lesions, falls, bleeding or bruising, loc, thoughts of suicide or self harm, memory loss  1.) Patient-completed health risk assessment  - completed and reviewed, see scanned documentation  2.) Review of Medical History: -PMH, PSH, Family History and current specialty and care providers reviewed and updated and listed below  - see scanned in document in chart and below Patient Care Team: Panosh, Standley Brooking, MD as PCP - General Corliss Parish, MD (Nephrology) Psychiatrist and PCP at the [facility], MD as Consulting Physician (Neurosurgery) Vanita Ingles, MD as Consulting Physician  (Cardiology) Verline Lema, MD as Consulting Physician (Internal Medicine) [place] Team: [place] Doctor: Dr. Terance Hart, [facility] - reports does eye exam once per year  Past Medical History:  Diagnosis Date  . Allergy   . Arthritis   . Cataract   . Diabetes mellitus without complication ([place])   . ED (erectile dysfunction)   . GERD (gastroesophageal reflux disease)   . Hx of adenomatous colonic polyps 07/15/2017  . Hyperlipidemia   . Hypertension   . Normal nuclear stress test    myoview neg 12 09 except for HT  . Other and unspecified alcohol dependence, unspecified drinking behavior    date of last use 02/13/11  . Prostate cancer ([place]) 02/2017   stage t1c, low risk and asymptomatic followed at [facility]  . Proteinuria    past record showed 500mg  protein excretion per 24 hours eval by renal  . PTSD (post-traumatic stress disorder)    from [place] experience [place]  . [facility]    [facility] History:  Procedure Laterality Date  . ls spinal surgery    . NM MYOVIEW LTD  12/09   neg except for HT  . PROSTATE BIOPSY  2018   stage t1c cancer  . TRIGGER FINGER RELEASE Left 10/18/2016   Procedure: LEFT TRIGGER THUMB RELEASE;  Surgeon: Milly Jakob, MD;  Location: [facility];  Service: Orthopedics;  Laterality: Left;    Social History   Socioeconomic History  . Marital status: Legally Separated    Spouse name: Not on file  . Number of children: Not on file  . Years  of education: Not on file  . Highest education level: Not on file  Occupational History  . 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 Financial resource strain: Not on file  . Food insecurity    Worry: Not on file    Inability: Not on file  . Transportation needs    Medical: Not on file    Non-medical: Not on file  Tobacco Use  . Smoking status: Former Smoker    Packs/day: 0.50    Years: 40.00     Pack years: 20.00    Types: Cigarettes    Quit date: 04/15/2009    Years since quitting: 9.7  . Smokeless tobacco: Never Used  Substance and Sexual Activity  . Alcohol use: Yes    Alcohol/week: 50.0 standard drinks    Types: 50 Shots of liquor per week    Comment: Pt in treatment for alcohol dependence. Last date of use 02/13/11.,  Very very little 01/27/13  . Drug use: No  . Sexual activity: Not on file  Lifestyle  . Physical activity    Days per week: Not on file    Minutes per session: Not on file  . Stress: Not on file  Relationships  . Social Herbalist on phone: Not on file    Gets together: Not on file    Attends religious service: Not on file    Active member of club or organization: Not on file    Attends meetings of clubs or organizations: Not on file    Relationship status: Not on file  . Intimate partner violence    Fear of current or ex partner: Not on file    Emotionally abused: Not on file    Physically abused: Not on file    Forced sexual activity: Not on file  Other Topics Concern  . 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   Working with disabled adults.    Playedgolf  Hunting in past           Family History  Problem Relation Age of Onset  . Diabetes Mother   . Hypertension Mother   . Other Mother        nerves  . Throat cancer Father        deceased  . Coronary artery disease Sister   . Diabetes Sister   . Heart disease Sister   . Hypertension Brother   . Alcohol abuse Paternal Aunt   . Alcohol abuse Cousin   . Drug abuse Son        cocaine dependent    Current Outpatient Medications on File Prior to Visit  Medication Sig Dispense Refill  .  amLODipine (NORVASC) 10 MG tablet Take 1 tablet (10 mg total) by mouth daily. 90 tablet 3  . carvedilol (COREG) 12.5 MG tablet Take 6.25 mg by mouth 2 (two) times daily with a meal.    . cetirizine (ZYRTEC) 10 MG tablet Take 10 mg by mouth daily.    . furosemide (LASIX) 40 MG tablet Take 1-2 tablets (40-80 mg total)  by mouth daily. Or as directed (Patient taking differently: Take 40 mg by mouth 2 (two) times daily. Take 1 tablet (40 mg) BID for 1 week. Then take 1 tablet (40 mg) daily) 30 tablet 0  . hydrALAZINE (APRESOLINE) 50 MG tablet Take 50 mg by mouth 3 (three) times daily.     . hydroxypropyl methylcellulose / hypromellose (ISOPTO TEARS / GONIOVISC) 2.5 % ophthalmic solution Place 1 drop into both eyes 2 (two) times daily.    Marland Kitchen ipratropium (ATROVENT) 0.03 % nasal spray Place 2 sprays into both nostrils every 12 (twelve) hours.    Marland Kitchen ketotifen (ZADITOR) 0.025 % ophthalmic solution Place 1 drop into both eyes 2 (two) times daily as needed.    . Megestrol Acetate (MEGACE ORAL PO) Take by mouth.    . memantine (NAMENDA) 10 MG tablet Take by mouth. Take 1/2 tab x 2 weeks, then 1 tab x 2 weeks, then 1.5 tab x 2 weeks then 2 tab daily to continue.    . montelukast (SINGULAIR) 10 MG tablet Take 10 mg by mouth daily.     . tamsulosin (FLOMAX) 0.4 MG CAPS capsule Take 0.4 mg by mouth at bedtime.    . traZODone (DESYREL) 50 MG tablet Take 25 mg by mouth at bedtime as needed for sleep.      Current Facility-Administered Medications on File Prior to Visit  Medication Dose Route Frequency Provider Last Rate Last Dose  . 0.9 %  sodium chloride infusion  500 mL Intravenous Once Gatha Mayer, MD         3.) Review of functional ability and level of safety:  Any difficulty hearing?  See scanned documentation - has hearing aides, uses sometimes  History of falling?  See scanned documentation  Any trouble with IADLs - using a phone, using transportation, grocery shopping, preparing meals, doing  housework, doing laundry, taking medications and managing money?  See scanned documentation  Advance Directives?  Discussed briefly and offered more resources and detailed discussion with our trained staff. Has set this up.   See summary of recommendations in Patient Instructions below.  4.) Physical Exam There were no vitals filed for this visit. Estimated body mass index is 27.87 kg/m as calculated from the following:   Height as of 09/16/17: 5' 10.5" (1.791 m).   Weight as of 12/31/17: 197 lb (89.4 kg).  EKG (optional): deferred  VITALS per patient if applicable: 831/51   GENERAL: alert, oriented, appears well and in no acute distress; visual acuity grossly intact, full vision exam deferred due to pandemic and/or virtual encounter  HEENT: atraumatic, conjunttiva clear, no obvious abnormalities on inspection of external nose and ears  NECK: normal movements of the head and neck  LUNGS: on inspection no signs of respiratory distress, breathing rate appears normal, no obvious gross SOB, gasping or wheezing  CV: no obvious cyanosis  MS: moves all visible extremities without noticeable abnormality  PSYCH/NEURO: pleasant and cooperative, no obvious depression or anxiety, speech and thought processing grossly intact, Cognitive function grossly intact  Depression screen [facility] 2/9 01/05/2019 09/16/2017 09/18/2016 06/06/2016 05/16/2016  Decreased Interest 0 1 0 0 0  Down, Depressed, Hopeless 0 0 0 0 0  PHQ - 2 Score 0 1 0 0 0     See patient instructions for recommendations.  Education and counseling regarding the above review of health provided with a plan for the following: -see scanned patient completed form for further details -fall prevention strategies discussed  - discussed, use  caution with getting up and down -healthy lifestyle discussed, eats a special diet, no exercise - has a nutritionist -importance and resources for completing advanced directives discussed, he has done  this -see patient instructions below for any other recommendations provided  4)The following written screening schedule of preventive measures were reviewed with assessment and plan made per below, orders and patient instructions:      AAA screening done if applicable - n/a not a smoker     Alcohol screening done - no alcohol     Obesity Screening and counseling done     STI screening (Hep C if born [birth year]) offered and per pt wishes - pt reports done     Tobacco Screening done        Pneumococcal (PPSV23 -one dose after 64, one before if risk factors), influenza yearly and hepatitis B vaccines (if high risk - end stage renal disease, IV drugs, homosexual men, live in home for mentally retarded, hemophilia receiving factors) ASSESSMENT/PLAN: done if applicable, reports done at the [place]      Prostate cancer screening ASSESSMENT/PLAN: reports was treated for prostate ca and released from urology, needs PCP follow up for exam      Colorectal cancer screening (FOBT yearly or flex sig q4y or colonoscopy q10y or barium enema q4y) ASSESSMENT/PLAN: utd or ordered - reports does colonosocpy at the [facility]      Diabetes outpatient self-management training services ASSESSMENT/PLAN: utd or done - needs follow up with PCP      Screening for glaucoma(q1y if high risk - diabetes, FH, AA and > 50 or hispanic and > 65) ASSESSMENT/PLAN: utd or advised      Medical nutritional therapy for individuals with diabetes or renal disease ASSESSMENT/PLAN: see orders      Cardiovascular screening blood tests (lipids q5y) ASSESSMENT/PLAN: see orders and labs      Diabetes screening tests ASSESSMENT/PLAN: see orders and labs   7.) Summary:   Medicare annual wellness visit, subsequent  -risk factors and conditions per above assessment were discussed and treatment, recommendations and referrals were offered per documentation above and orders and patient instructions. -he reports that the majority of his  preventive care is done at the [place], he would like to see his PCP for general follow up as not seen in some time, labs, foot exam prostate exam if she feels is needed. ASked him to bring any labs that he did elsewhere in the last year to that appointment. Sent message to scheduling staff to assist.  Patient Instructions    Jeremy Black , Thank you for taking time to come for your Medicare Wellness Visit. I appreciate your ongoing commitment to your health goals. Please review the following plan we discussed and let me know if I can assist you in the future.   I sent a message to our scheduling specialist to assist you with getting a follow up appointment with Dr. Regis Bill. You can do labs that day if she feels they are needed. Please bring any labs that you have done elsewhere in the last 1 year. Please discuss the need for any further monitoring of you prostate with her that day.   These are the goals we discussed: Goals      Exercise/Diet   . Exercise 150 minutes per week (moderate activity) Given the pandemic, you can do walking outside or home exercise videos. I would not recommend indoor group exercise during the pandemic.   Work with your nutritionist to continue a healthy  diet.          Please bring copies of the following to your primary doctor: -colonoscopy -advanced directives -vaccine records -last 1 year of labs               This is a list of the screening recommended for you and due dates:  Health Maintenance  Topic Date Due  . Complete foot exam   Schedule appt with your doctor  . Hemoglobin A1C  Bring labs, request at your doctor visit if not done in the last 1 year.  . Eye exam for diabetics  05/08/2019  . Colon Cancer Screening  07/08/2020  . Tetanus Vaccine  04/20/2024  . Flu Shot  Completed  . Pneumonia vaccines  Completed    Lucretia Kern, DO

## 2019-01-05 NOTE — Patient Instructions (Signed)
  Jeremy Black , Thank you for taking time to come for your Medicare Wellness Visit. I appreciate your ongoing commitment to your health goals. Please review the following plan we discussed and let me know if I can assist you in the future.   I sent a message to our scheduling specialist to assist you with getting a follow up appointment with Dr. Regis Bill. You can do labs that day if she feels they are needed. Please bring any labs that you have done elsewhere in the last 1 year. Please discuss the need for any further monitoring of you prostate with her that day.   These are the goals we discussed: Goals      Exercise/Diet   . Exercise 150 minutes per week (moderate activity) Given the pandemic, you can do walking outside or home exercise videos. I would not recommend indoor group exercise during the pandemic.   Work with your nutritionist to continue a healthy diet.          Please bring copies of the following to your primary doctor: -colonoscopy -advanced directives -vaccine records -last 1 year of labs               This is a list of the screening recommended for you and due dates:  Health Maintenance  Topic Date Due  . Complete foot exam   Schedule appt with your doctor  . Hemoglobin A1C  Bring labs, request at your doctor visit if not done in the last 1 year.  . Eye exam for diabetics  05/08/2019  . Colon Cancer Screening  07/08/2020  . Tetanus Vaccine  04/20/2024  . Flu Shot  Completed  . Pneumonia vaccines  Completed

## 2019-01-07 NOTE — Progress Notes (Signed)
Scheduled f/u per Dr Julianne Rice visit//tes

## 2019-01-11 NOTE — Progress Notes (Signed)
Okay.  I think it is that way due to it being scheduled under the Telemedicine type visit.  I see this charge though.  Thanks, [employer]

## 2019-01-11 NOTE — Addendum Note (Signed)
Addended by: Lucretia Kern on: 01/11/2019 11:34 AM   Modules accepted: Level of Service

## 2019-01-12 NOTE — Progress Notes (Signed)
Hi Jeremy Black,  Sorry. Had already communicated with [facility] regarding this patient. Should be the AWV.  Thanks!

## 2019-01-26 NOTE — Progress Notes (Signed)
Chief Complaint  Patient presents with  . Annual Exam    HPI: Jeremy Black 73 y.o. comes in today for Preventive Medicare exam/ wellness visit .saw dr Maudie Mercury for AWV  And told to come in for [place]   Last visit with me was July 2019 .  He is here with his wife  Has  ESRD   In transplant classes  On dialysis  for a  Year  an today had iron infusion cause of anemia   DM not cheking bg and no meds   [facility]  did  [facility] exam  Doesn't check feet  But not concerned   Prostate  Cancer  rx with " Shots and froze" fu  at [place] . Seldom need for cv doc   heart  But [facility].  Is team  Has hearing aids  Wife says does better with them but not wearing today  Psych ptsd   Still getting counseling  BP:  monitored med [facility]   No longer on amlodipine and lasix  But hydralazine and  coreg thinks BP  has been in 150 range   No swelling  syncope      Health Maintenance  Topic Date Due  . FOOT EXAM  02/23/2014  . HEMOGLOBIN A1C  03/18/2018  . OPHTHALMOLOGY EXAM  05/08/2019  . COLONOSCOPY  07/08/2020  . TETANUS/TDAP  04/20/2024  . INFLUENZA VACCINE  Completed  . PNA vac Low Risk Adult  Completed   Health Maintenance Review LIFESTYLE:  Exercise:   less Tobacco/ETS: no Alcohol:  no Sugar beverages:  Grape juice .   No bg check  Sleep:night  Drug use: no HH:2 no pets  ROS:  GEN/ HEENT: No fever, significant weight changes sweats headaches vision problems hearing changes, CV/ PULM; No chest pain shortness of breath cough, syncope,edema  change in exercise tolerance. But tired and not exercising  GI /GU: No adominal pain, vomiting, change in bowel habits. No blood in the stool. No significant new GU symptoms. SKIN/HEME: ,no acute skin rashes suspicious lesions or bleeding. No lymphadenopathy, nodules, masses.  NEURO/ PSYCH:  No  New neurologic signs such as weakness numbness.  IMM/ Allergy: No unusual infections.  Allergy .   REST of 12 system review negative except as per HPI   Past Medical History:   Diagnosis Date  . Allergy   . Arthritis   . Cataract   . Diabetes mellitus without complication ([place])   . ED (erectile dysfunction)   . GERD (gastroesophageal reflux disease)   . Hx of adenomatous colonic polyps 07/15/2017  . Hyperlipidemia   . Hypertension   . Normal nuclear stress test    myoview neg 12 09 except for HT  . Other and unspecified alcohol dependence, unspecified drinking behavior    date of last use 02/13/11  . Prostate cancer ([place]) 02/2017   stage t1c, low risk and asymptomatic followed at [facility]  . Proteinuria    past record showed 500mg  protein excretion per 24 hours eval by renal  . PTSD (post-traumatic stress disorder)    from [place] experience [place]  . S/P dilatation of esophageal stricture    at the [facility]    Family History  Problem Relation Age of Onset  . Diabetes Mother   . Hypertension Mother   . Other Mother        nerves  . Throat cancer Father        deceased  . Coronary artery disease Sister   .  Diabetes Sister   . Heart disease Sister   . Hypertension Brother   . Alcohol abuse Paternal Aunt   . Alcohol abuse Cousin   . Drug abuse Son        cocaine dependent    Social History   Socioeconomic History  . Marital status: Legally Separated    Spouse name: Not on file  . Number of children: Not on file  . Years of education: Not on file  . Highest education level: Not on file  Occupational History  . 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 Financial resource strain: Not on file  . Food insecurity    Worry: Not on file    Inability: Not on file  . Transportation needs    Medical: Not on file    Non-medical: Not on file  Tobacco Use  . Smoking status: Former Smoker    Packs/day: 0.50    Years: 40.00    Pack years: 20.00    Types: Cigarettes    Quit date: 04/15/2009    Years since quitting: 9.7  . Smokeless tobacco: Never Used  Substance and Sexual Activity  . Alcohol use: Yes    Alcohol/week: 50.0  standard drinks    Types: 50 Shots of liquor per week    Comment: Pt in treatment for alcohol dependence. Last date of use 02/13/11.,  Very very little 01/27/13  . Drug use: No  . Sexual activity: Not on file  Lifestyle  . Physical activity    Days per week: Not on file    Minutes per session: Not on file  . Stress: Not on file  Relationships  . Social Herbalist on phone: Not on file    Gets together: Not on file    Attends religious service: Not on file    Active member of club or organization: Not on file    Attends meetings of clubs or organizations: Not on file    Relationship status: Not on file  Other Topics Concern  . 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   Working with disabled adults.    Playedgolf  Hunting in past           Outpatient Encounter Medications as of 01/27/2019  Medication Sig  . carvedilol (COREG) 12.5 MG tablet Take 6.25 mg by mouth 2 (two) times daily with a meal.  . cetirizine (ZYRTEC) 10 MG tablet Take 10 mg by mouth daily.  . hydrALAZINE (APRESOLINE) 50 MG tablet Take 50 mg by mouth 3 (three) times daily.   . hydroxypropyl methylcellulose / hypromellose (ISOPTO TEARS / GONIOVISC) 2.5 % ophthalmic solution Place 1 drop into both eyes 2 (two) times daily.  Marland Kitchen ipratropium (ATROVENT) 0.03 % nasal spray Place 2 sprays into both nostrils every 12 (twelve) hours.  Marland Kitchen ketotifen (ZADITOR) 0.025 % ophthalmic solution Place 1 drop into both eyes 2 (two) times daily as needed.  . Megestrol Acetate (MEGACE ORAL PO) Take by mouth.  . memantine (NAMENDA) 10 MG tablet Take by mouth. Take 1/2 tab x 2  weeks, then 1 tab x 2 weeks, then 1.5 tab x 2 weeks then 2 tab daily to  continue.  . montelukast (SINGULAIR) 10 MG tablet Take 10 mg by mouth daily.   . tamsulosin (FLOMAX) 0.4 MG CAPS capsule Take 0.4 mg by mouth at bedtime.  . traZODone (DESYREL) 50 MG tablet Take 25 mg by mouth at bedtime as needed for sleep.   . [DISCONTINUED] amLODipine (NORVASC) 10 MG tablet Take 1 tablet (10 mg total) by mouth daily.  . [DISCONTINUED] furosemide (LASIX) 40 MG tablet Take 1-2 tablets (40-80 mg total) by mouth daily. Or as directed (Patient taking differently: Take 40 mg by mouth 2 (two) times daily. Take 1 tablet (40 mg) BID for 1 week. Then take 1 tablet (40 mg) daily)   Facility-Administered Encounter Medications as of 01/27/2019  Medication  . 0.9 %  sodium chloride infusion    EXAM:  BP (!) 150/70 (BP Location: Left Arm, Patient Position: Sitting, Cuff Size: Normal)   Pulse 87   Temp 98.5 F (36.9 C) (Temporal)   Ht 5\' 11"  (1.803 m)   Wt 198 lb 6.4 oz (90 kg)   SpO2 95%   BMI 27.67 kg/m   Body mass index is 27.67 kg/m.  Physical Exam: Vital signs reviewed WYO:VZCH is a well-developed well-nourished alert cooperative   who appears stated age in no acute distress.  HEENT: normocephalic atraumatic , Eyes: PERRL EOM's full, conjunctiva clear, , Ears: no deformity EAC's clear TMs with normal landmarks. OP,masked NECK: supple without masses, thyromegaly or bruits. CHEST/PULM:  Clear to auscultation and percussion breath sounds equal no wheeze , rales or rhonchi. No chest wall deformities or tenderness. CV: PMI is nondisplaced, S1 S2 no gallops, murmurs, rubs. Peripheral pulses are present without delay.No JVD .  ABDOMEN: Bowel sounds normal nontender  No guard or rebound, no hepato splenomegal no CVA tenderness.   Extremtities:  No clubbing cyanosis or edema, no acute joint swelling or redness no focal atrophy NEURO:  Oriented x3, cranial nerves 3-12 appear to be intact, no obvious focal weakness,gait within normal limits  SKIN: No acute rashes normal turgor,  color, no bruising or petechiae. PSYCH: Oriented, good eye contact, no obvious depression anxiety, cognition and judgment appear normal. LN: no cervical axillaryadenopathy Memory not formally tested  Diabetic Foot Exam - Simple   Simple Foot Form Diabetic Foot exam was performed with the following findings: Yes 01/27/2019  3:25 PM  Visual Inspection No deformities, no ulcerations, no other skin breakdown bilaterally: Yes Sensation Testing See comments: Yes Pulse Check Posterior Tibialis and Dorsalis pulse intact bilaterally: Yes Comments Dec sense monofilament  Toes   Distal planar foot but ok no dorsal surface no  Lesions  Or ulcers       Lab Results  Component Value Date   WBC 7.9 02/05/2017   HGB 9.0 (L) 02/05/2017   HCT 27.4 (L) 02/05/2017   PLT 249 02/05/2017   GLUCOSE 102 (H) 02/05/2017   CHOL 147 04/20/2014   TRIG 129.0 04/20/2014   HDL 42.20 04/20/2014   LDLDIRECT 66.7 01/31/2011   LDLCALC 79 04/20/2014   ALT 23 02/05/2017   AST 20 02/05/2017   NA 140 02/05/2017   K 4.1 02/05/2017   CL 108 02/05/2017   CREATININE 3.98 (H) 02/05/2017   BUN 32 (H) 02/05/2017   CO2 20 (L) 02/05/2017   TSH 0.76 07/09/2012   PSA 0.82 09/07/2009   HGBA1C 5.1 01/27/2019   MICROALBUR 141.5 (H) 09/14/2009  ASSESSMENT AND PLAN:  Discussed the following assessment and plan:  Visit for preventive health examination  ESRD (end stage renal disease) on dialysis ([place])  History of prostate cancer  Essential hypertension - check with va pcp who is managmeing and get readings about the 150 bp readings and med adjustment  DM type 2, controlled, with complication ([place]) - controlled   since  esrd  - Plan: POCT glycosylated hemoglobin (Hb A1C)  ESRD on dialysis ([place])  Anemia in chronic kidney disease, unspecified CKD stage  Hearing aid worn - but not today   Other diabetic neurological complication associated with type 2 diabetes mellitus ([place]) Reported ud  Influenza vaccine   Patient Care Team: Chirstina Haan, Standley Brooking, MD as PCP - General Corliss Parish, MD (Nephrology) Psychiatrist and PCP at the [facility], MD as Consulting Physician (Neurosurgery) Braxton Feathers, Wallis Bamberg, MD as Consulting Physician (Cardiology) Verline Lema, MD as Consulting Physician (Internal Medicine)  Patient Instructions  If bp 150 range   Please ask your PCP at the [facility] about  Whether you should adjust BP medication and or add back low dose amlodipine   Your a 1c is excellent today  5.1   Keep [place] informed about any changes in your medical status . Not doing any labs today  Otherwise  .   Check your feet once a day  To be sure no injuries or sores.  On them.   Otherwise   Yearly cpx and or as needed.    Health Maintenance, Male Adopting a healthy lifestyle and getting preventive care are important in promoting health and wellness. Ask your health care provider about:  The right schedule for you to have regular tests and exams.  Things you can do on your own to prevent diseases and keep yourself healthy. What should I know about diet, weight, and exercise? Eat a healthy diet   Eat a diet that includes plenty of vegetables, fruits, low-fat dairy products, and lean protein.  Do not eat a lot of foods that are high in solid fats, added sugars, or sodium. Maintain a healthy weight Body mass index (BMI) is a measurement that can be used to identify possible weight problems. It estimates body fat based on height and weight. Your health care provider can help determine your BMI and help you achieve or maintain a healthy weight. Get regular exercise Get regular exercise. This is one of the most important things you can do for your health. Most adults should:  Exercise for at least 150 minutes each week. The exercise should increase your heart rate and make you sweat (moderate-intensity exercise).  Do strengthening exercises at least twice a week. This is in addition  to the moderate-intensity exercise.  Spend less time sitting. Even light physical activity can be beneficial. Watch cholesterol and blood lipids Have your blood tested for lipids and cholesterol at 73 years of age, then have this test every 5 years. You may need to have your cholesterol levels checked more often if:  Your lipid or cholesterol levels are high.  You are older than 73 years of age.  You are at high risk for heart disease. What should I know about cancer screening? Many types of cancers can be detected early and may often be prevented. Depending on your health history and family history, you may need to have cancer screening at various ages. This may include screening for:  Colorectal cancer.  Prostate cancer.  Skin cancer.  Lung  cancer. What should I know about heart disease, diabetes, and high blood pressure? Blood pressure and heart disease  High blood pressure causes heart disease and increases the risk of stroke. This is more likely to develop in people who have high blood pressure readings, are of African descent, or are overweight.  Talk with your health care provider about your target blood pressure readings.  Have your blood pressure checked: ? Every 3-5 years if you are 48-76 years of age. ? Every year if you are 57 years old or older.  If you are between the ages of 56 and 34 and are a current or former smoker, ask your health care provider if you should have a one-time screening for abdominal aortic aneurysm (AAA). Diabetes Have regular diabetes screenings. This checks your fasting blood sugar level. Have the screening done:  Once every three years after age 20 if you are at a normal weight and have a low risk for diabetes.  More often and at a younger age if you are overweight or have a high risk for diabetes. What should I know about preventing infection? Hepatitis B If you have a higher risk for hepatitis B, you should be screened for this virus.  Talk with your health care provider to find out if you are at risk for hepatitis B infection. Hepatitis C Blood testing is recommended for:  Everyone born from [birth year] through 1965.  Anyone with known risk factors for hepatitis C. Sexually transmitted infections (STIs)  You should be screened each year for STIs, including gonorrhea and chlamydia, if: ? You are sexually active and are younger than 73 years of age. ? You are older than 73 years of age and your health care provider tells you that you are at risk for this type of infection. ? Your sexual activity has changed since you were last screened, and you are at increased risk for chlamydia or gonorrhea. Ask your health care provider if you are at risk.  Ask your health care provider about whether you are at high risk for HIV. Your health care provider may recommend a prescription medicine to help prevent HIV infection. If you choose to take medicine to prevent HIV, you should first get tested for HIV. You should then be tested every 3 months for as long as you are taking the medicine. Follow these instructions at home: Lifestyle  Do not use any products that contain nicotine or tobacco, such as cigarettes, e-cigarettes, and chewing tobacco. If you need help quitting, ask your health care provider.  Do not use street drugs.  Do not share needles.  Ask your health care provider for help if you need support or information about quitting drugs. Alcohol use  Do not drink alcohol if your health care provider tells you not to drink.  If you drink alcohol: ? Limit how much you have to 0-2 drinks a day. ? Be aware of how much alcohol is in your drink. In the [place] one drink equals one 12 oz bottle of beer (355 mL), one 5 oz glass of wine (148 mL), or one 1 oz glass of hard liquor (44 mL). General instructions  Schedule regular health, dental, and eye exams.  Stay current with your vaccines.  Tell your health care provider if: ? You  often feel depressed. ? You have ever been abused or do not feel safe at home. Summary  Adopting a healthy lifestyle and getting preventive care are important in promoting health and wellness.  Follow your health care provider's instructions about healthy diet, exercising, and getting tested or screened for diseases.  Follow your health care provider's instructions on monitoring your cholesterol and blood pressure. This information is not intended to replace advice given to you by your health care provider. Make sure you discuss any questions you have with your health care provider. Document Released: 09/28/2007 Document Revised: 03/25/2018 Document Reviewed: 03/25/2018 Elsevier Patient Education  2020 [place] Carlye Panameno M.D.

## 2019-01-27 ENCOUNTER — Ambulatory Visit (INDEPENDENT_AMBULATORY_CARE_PROVIDER_SITE_OTHER): Payer: Medicare Other | Admitting: Internal Medicine

## 2019-01-27 ENCOUNTER — Other Ambulatory Visit: Payer: Self-pay

## 2019-01-27 ENCOUNTER — Encounter: Payer: Self-pay | Admitting: Internal Medicine

## 2019-01-27 VITALS — BP 150/70 | HR 87 | Temp 98.5°F | Ht 71.0 in | Wt 198.4 lb

## 2019-01-27 DIAGNOSIS — N186 End stage renal disease: Secondary | ICD-10-CM

## 2019-01-27 DIAGNOSIS — Z8546 Personal history of malignant neoplasm of prostate: Secondary | ICD-10-CM

## 2019-01-27 DIAGNOSIS — I10 Essential (primary) hypertension: Secondary | ICD-10-CM

## 2019-01-27 DIAGNOSIS — Z0000 Encounter for general adult medical examination without abnormal findings: Secondary | ICD-10-CM | POA: Diagnosis not present

## 2019-01-27 DIAGNOSIS — Z974 Presence of external hearing-aid: Secondary | ICD-10-CM

## 2019-01-27 DIAGNOSIS — E118 Type 2 diabetes mellitus with unspecified complications: Secondary | ICD-10-CM | POA: Diagnosis not present

## 2019-01-27 DIAGNOSIS — N189 Chronic kidney disease, unspecified: Secondary | ICD-10-CM

## 2019-01-27 DIAGNOSIS — E1149 Type 2 diabetes mellitus with other diabetic neurological complication: Secondary | ICD-10-CM

## 2019-01-27 DIAGNOSIS — Z992 Dependence on renal dialysis: Secondary | ICD-10-CM

## 2019-01-27 DIAGNOSIS — D631 Anemia in chronic kidney disease: Secondary | ICD-10-CM

## 2019-01-27 LAB — POCT GLYCOSYLATED HEMOGLOBIN (HGB A1C): Hemoglobin A1C: 5.1 % (ref 4.0–5.6)

## 2019-01-27 NOTE — Patient Instructions (Addendum)
If bp 150 range   Please ask your PCP at the [place] about  Whether you should adjust BP medication and or add back low dose amlodipine   Your a 1c is excellent today  5.1   Keep [place] informed about any changes in your medical status . Not doing any labs today  Otherwise  .   Check your feet once a day  To be sure no injuries or sores.  On them.   Otherwise   Yearly cpx and or as needed.    Health Maintenance, Male Adopting a healthy lifestyle and getting preventive care are important in promoting health and wellness. Ask your health care provider about:  The right schedule for you to have regular tests and exams.  Things you can do on your own to prevent diseases and keep yourself healthy. What should I know about diet, weight, and exercise? Eat a healthy diet   Eat a diet that includes plenty of vegetables, fruits, low-fat dairy products, and lean protein.  Do not eat a lot of foods that are high in solid fats, added sugars, or sodium. Maintain a healthy weight Body mass index (BMI) is a measurement that can be used to identify possible weight problems. It estimates body fat based on height and weight. Your health care provider can help determine your BMI and help you achieve or maintain a healthy weight. Get regular exercise Get regular exercise. This is one of the most important things you can do for your health. Most adults should:  Exercise for at least 150 minutes each week. The exercise should increase your heart rate and make you sweat (moderate-intensity exercise).  Do strengthening exercises at least twice a week. This is in addition to the moderate-intensity exercise.  Spend less time sitting. Even light physical activity can be beneficial. Watch cholesterol and blood lipids Have your blood tested for lipids and cholesterol at 73 years of age, then have this test every 5 years. You may need to have your cholesterol levels checked more often if:  Your lipid or cholesterol  levels are high.  You are older than 73 years of age.  You are at high risk for heart disease. What should I know about cancer screening? Many types of cancers can be detected early and may often be prevented. Depending on your health history and family history, you may need to have cancer screening at various ages. This may include screening for:  Colorectal cancer.  Prostate cancer.  Skin cancer.  Lung cancer. What should I know about heart disease, diabetes, and high blood pressure? Blood pressure and heart disease  High blood pressure causes heart disease and increases the risk of stroke. This is more likely to develop in people who have high blood pressure readings, are of African descent, or are overweight.  Talk with your health care provider about your target blood pressure readings.  Have your blood pressure checked: ? Every 3-5 years if you are 73-4 years of age. ? Every year if you are 19 years old or older.  If you are between the ages of 73 and 31 and are a current or former smoker, ask your health care provider if you should have a one-time screening for abdominal aortic aneurysm (AAA). Diabetes Have regular diabetes screenings. This checks your fasting blood sugar level. Have the screening done:  Once every three years after age 41 if you are at a normal weight and have a low risk for diabetes.  More often  and at a younger age if you are overweight or have a high risk for diabetes. What should I know about preventing infection? Hepatitis B If you have a higher risk for hepatitis B, you should be screened for this virus. Talk with your health care provider to find out if you are at risk for hepatitis B infection. Hepatitis C Blood testing is recommended for:  Everyone born from [birth year] through 1965.  Anyone with known risk factors for hepatitis C. Sexually transmitted infections (STIs)  You should be screened each year for STIs, including gonorrhea and  chlamydia, if: ? You are sexually active and are younger than 73 years of age. ? You are older than 73 years of age and your health care provider tells you that you are at risk for this type of infection. ? Your sexual activity has changed since you were last screened, and you are at increased risk for chlamydia or gonorrhea. Ask your health care provider if you are at risk.  Ask your health care provider about whether you are at high risk for HIV. Your health care provider may recommend a prescription medicine to help prevent HIV infection. If you choose to take medicine to prevent HIV, you should first get tested for HIV. You should then be tested every 3 months for as long as you are taking the medicine. Follow these instructions at home: Lifestyle  Do not use any products that contain nicotine or tobacco, such as cigarettes, e-cigarettes, and chewing tobacco. If you need help quitting, ask your health care provider.  Do not use street drugs.  Do not share needles.  Ask your health care provider for help if you need support or information about quitting drugs. Alcohol use  Do not drink alcohol if your health care provider tells you not to drink.  If you drink alcohol: ? Limit how much you have to 0-2 drinks a day. ? Be aware of how much alcohol is in your drink. In the [place] one drink equals one 12 oz bottle of beer (355 mL), one 5 oz glass of wine (148 mL), or one 1 oz glass of hard liquor (44 mL). General instructions  Schedule regular health, dental, and eye exams.  Stay current with your vaccines.  Tell your health care provider if: ? You often feel depressed. ? You have ever been abused or do not feel safe at home. Summary  Adopting a healthy lifestyle and getting preventive care are important in promoting health and wellness.  Follow your health care provider's instructions about healthy diet, exercising, and getting tested or screened for diseases.  Follow your health  care provider's instructions on monitoring your cholesterol and blood pressure. This information is not intended to replace advice given to you by your health care provider. Make sure you discuss any questions you have with your health care provider. Document Released: 09/28/2007 Document Revised: 03/25/2018 Document Reviewed: 03/25/2018 Elsevier Patient Education  2020 [employer].

## 2019-05-19 DIAGNOSIS — G4700 Insomnia, unspecified: Secondary | ICD-10-CM | POA: Diagnosis not present

## 2019-05-19 DIAGNOSIS — H9319 Tinnitus, unspecified ear: Secondary | ICD-10-CM | POA: Diagnosis not present

## 2019-05-19 DIAGNOSIS — I120 Hypertensive chronic kidney disease with stage 5 chronic kidney disease or end stage renal disease: Secondary | ICD-10-CM | POA: Diagnosis not present

## 2019-05-19 DIAGNOSIS — Z992 Dependence on renal dialysis: Secondary | ICD-10-CM | POA: Diagnosis not present

## 2019-05-19 DIAGNOSIS — E785 Hyperlipidemia, unspecified: Secondary | ICD-10-CM | POA: Diagnosis not present

## 2019-05-19 DIAGNOSIS — H269 Unspecified cataract: Secondary | ICD-10-CM | POA: Diagnosis not present

## 2019-05-19 DIAGNOSIS — N186 End stage renal disease: Secondary | ICD-10-CM | POA: Diagnosis not present

## 2019-05-19 DIAGNOSIS — K219 Gastro-esophageal reflux disease without esophagitis: Secondary | ICD-10-CM | POA: Diagnosis not present

## 2019-05-19 DIAGNOSIS — J309 Allergic rhinitis, unspecified: Secondary | ICD-10-CM | POA: Diagnosis not present

## 2019-05-19 DIAGNOSIS — H9190 Unspecified hearing loss, unspecified ear: Secondary | ICD-10-CM | POA: Diagnosis not present

## 2019-05-27 DIAGNOSIS — H2513 Age-related nuclear cataract, bilateral: Secondary | ICD-10-CM | POA: Diagnosis not present

## 2019-05-31 ENCOUNTER — Encounter (INDEPENDENT_AMBULATORY_CARE_PROVIDER_SITE_OTHER): Payer: Medicare Other | Admitting: Ophthalmology

## 2019-05-31 ENCOUNTER — Other Ambulatory Visit: Payer: Self-pay

## 2019-05-31 DIAGNOSIS — H2513 Age-related nuclear cataract, bilateral: Secondary | ICD-10-CM

## 2019-05-31 DIAGNOSIS — H43813 Vitreous degeneration, bilateral: Secondary | ICD-10-CM | POA: Diagnosis not present

## 2019-05-31 DIAGNOSIS — H35033 Hypertensive retinopathy, bilateral: Secondary | ICD-10-CM | POA: Diagnosis not present

## 2019-05-31 DIAGNOSIS — I10 Essential (primary) hypertension: Secondary | ICD-10-CM

## 2019-07-22 DIAGNOSIS — H35361 Drusen (degenerative) of macula, right eye: Secondary | ICD-10-CM | POA: Diagnosis not present

## 2019-07-22 DIAGNOSIS — H2511 Age-related nuclear cataract, right eye: Secondary | ICD-10-CM | POA: Diagnosis not present

## 2019-07-22 DIAGNOSIS — H25043 Posterior subcapsular polar age-related cataract, bilateral: Secondary | ICD-10-CM | POA: Diagnosis not present

## 2019-07-22 DIAGNOSIS — H35033 Hypertensive retinopathy, bilateral: Secondary | ICD-10-CM | POA: Diagnosis not present

## 2019-07-22 DIAGNOSIS — H25013 Cortical age-related cataract, bilateral: Secondary | ICD-10-CM | POA: Diagnosis not present

## 2019-07-22 DIAGNOSIS — H2513 Age-related nuclear cataract, bilateral: Secondary | ICD-10-CM | POA: Diagnosis not present

## 2019-07-22 LAB — HM DIABETES EYE EXAM

## 2019-10-12 DIAGNOSIS — H2511 Age-related nuclear cataract, right eye: Secondary | ICD-10-CM | POA: Diagnosis not present

## 2019-10-12 DIAGNOSIS — H25811 Combined forms of age-related cataract, right eye: Secondary | ICD-10-CM | POA: Diagnosis not present

## 2019-11-03 DIAGNOSIS — H2512 Age-related nuclear cataract, left eye: Secondary | ICD-10-CM | POA: Diagnosis not present

## 2019-11-03 DIAGNOSIS — H25012 Cortical age-related cataract, left eye: Secondary | ICD-10-CM | POA: Diagnosis not present

## 2019-11-03 DIAGNOSIS — H25042 Posterior subcapsular polar age-related cataract, left eye: Secondary | ICD-10-CM | POA: Diagnosis not present

## 2019-11-03 LAB — HM DIABETES EYE EXAM

## 2019-11-16 DIAGNOSIS — H2512 Age-related nuclear cataract, left eye: Secondary | ICD-10-CM | POA: Diagnosis not present

## 2019-11-16 DIAGNOSIS — H25012 Cortical age-related cataract, left eye: Secondary | ICD-10-CM | POA: Diagnosis not present

## 2019-11-16 DIAGNOSIS — H25042 Posterior subcapsular polar age-related cataract, left eye: Secondary | ICD-10-CM | POA: Diagnosis not present

## 2019-11-16 DIAGNOSIS — H5712 Ocular pain, left eye: Secondary | ICD-10-CM | POA: Diagnosis not present

## 2019-11-16 DIAGNOSIS — H25812 Combined forms of age-related cataract, left eye: Secondary | ICD-10-CM | POA: Diagnosis not present

## 2019-12-16 LAB — HM DIABETES EYE EXAM

## 2020-03-23 DIAGNOSIS — Z01818 Encounter for other preprocedural examination: Secondary | ICD-10-CM | POA: Diagnosis not present

## 2020-03-23 DIAGNOSIS — J432 Centrilobular emphysema: Secondary | ICD-10-CM | POA: Diagnosis not present

## 2020-03-23 DIAGNOSIS — Z992 Dependence on renal dialysis: Secondary | ICD-10-CM | POA: Diagnosis not present

## 2020-03-23 DIAGNOSIS — I120 Hypertensive chronic kidney disease with stage 5 chronic kidney disease or end stage renal disease: Secondary | ICD-10-CM | POA: Diagnosis not present

## 2020-03-23 DIAGNOSIS — Z01810 Encounter for preprocedural cardiovascular examination: Secondary | ICD-10-CM | POA: Diagnosis not present

## 2020-03-23 DIAGNOSIS — I2510 Atherosclerotic heart disease of native coronary artery without angina pectoris: Secondary | ICD-10-CM | POA: Diagnosis not present

## 2020-03-23 DIAGNOSIS — Z1159 Encounter for screening for other viral diseases: Secondary | ICD-10-CM | POA: Diagnosis not present

## 2020-03-23 DIAGNOSIS — N281 Cyst of kidney, acquired: Secondary | ICD-10-CM | POA: Diagnosis not present

## 2020-03-23 DIAGNOSIS — N186 End stage renal disease: Secondary | ICD-10-CM | POA: Diagnosis not present

## 2020-03-23 DIAGNOSIS — Z8679 Personal history of other diseases of the circulatory system: Secondary | ICD-10-CM | POA: Diagnosis not present

## 2020-03-23 DIAGNOSIS — E1122 Type 2 diabetes mellitus with diabetic chronic kidney disease: Secondary | ICD-10-CM | POA: Diagnosis not present

## 2020-05-18 ENCOUNTER — Telehealth: Payer: Self-pay | Admitting: Internal Medicine

## 2020-05-18 NOTE — Telephone Encounter (Signed)
Tried calling patient to  schedule Medicare Annual Wellness Visit (AWV) either virtually or in office. No answer   Last AWV 01/05/19 please schedule at anytime with [facility] Nurse Health Advisor 1 or 2   This should be a 45 minute visit. Patient also needs appointment with pcp last appointment 01/27/2019

## 2020-07-24 ENCOUNTER — Telehealth: Payer: Self-pay | Admitting: Internal Medicine

## 2020-07-24 NOTE — Telephone Encounter (Signed)
If patient returned my call  Please disregard.  I was able to reschedule another patient.  Just confirm appointment with patient

## 2020-07-25 ENCOUNTER — Ambulatory Visit (INDEPENDENT_AMBULATORY_CARE_PROVIDER_SITE_OTHER): Payer: Medicare PPO | Admitting: Internal Medicine

## 2020-07-25 ENCOUNTER — Encounter: Payer: Self-pay | Admitting: Internal Medicine

## 2020-07-25 ENCOUNTER — Other Ambulatory Visit: Payer: Self-pay

## 2020-07-25 VITALS — BP 90/58 | HR 86 | Temp 98.2°F | Ht 71.0 in | Wt 196.6 lb

## 2020-07-25 DIAGNOSIS — Z992 Dependence on renal dialysis: Secondary | ICD-10-CM

## 2020-07-25 DIAGNOSIS — N186 End stage renal disease: Secondary | ICD-10-CM

## 2020-07-25 DIAGNOSIS — R2681 Unsteadiness on feet: Secondary | ICD-10-CM

## 2020-07-25 DIAGNOSIS — D631 Anemia in chronic kidney disease: Secondary | ICD-10-CM | POA: Diagnosis not present

## 2020-07-25 DIAGNOSIS — N189 Chronic kidney disease, unspecified: Secondary | ICD-10-CM

## 2020-07-25 DIAGNOSIS — R5383 Other fatigue: Secondary | ICD-10-CM

## 2020-07-25 DIAGNOSIS — I959 Hypotension, unspecified: Secondary | ICD-10-CM | POA: Diagnosis not present

## 2020-07-25 NOTE — Patient Instructions (Signed)
I will send info to Dr Theda Sers fax   Check Bp readings before and after hydralazine for 5 days and then as needed.  Consideration if  Decrease the dose of  Hydralazine would help fatigue when bp is low .  And  Then fu with them .   I agree if PT is  Concerned need more   Evaluation or   Direction   Moving forward.

## 2020-07-25 NOTE — Progress Notes (Signed)
Chief Complaint  Patient presents with  . low blood pressure   . Shortness of Breath  . Balance issues     HPI: Jeremy Black 75 y.o. come in for SDA with wife today he gets his care at the [facility] for primary care and medications and [facility] for dialysis and on the renal transplant list. Last visit with me was October 2020. PCP team had referred home health for physical therapist coming 2 times a week and a nurse once a week to help increase his energy and fatigue. [facility]  But  Sent to PCP    Because of listed .   Having a hard time doing exercises and balance  An issue.    [facility]'t see [facility]    Soon so came here.  Blood pressure readings have been low many under 100 not on dialysis days.  He gets dialysis 3 days a week Monday Wednesday Friday they take blood pressure before orthostatic and after apparently gets "blood" weekly and fluids as needed for low blood pressure. He is on hydralazine 50 3 times daily Cardis and amlodipine  He does complain of orthostasis after dialysis no fainting or size tolerance is suboptimal but unclear if it is changed  He is on Aricept by the [place] doctor Last Monday  Seen doctor team at the [facility] and they are planning on neurology evaluation at the end of the month.     No active bleeding cough edema.  ROS: See pertinent positives and negatives per HPI.  Wife thinks there could be some depression involved also.  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  So not involved at this time.  Was a past history of PTSD and treatment.  Past Medical History:  Diagnosis Date  . Allergy   . Arthritis   . Cataract   . Diabetes mellitus without complication ([place])   . ED (erectile dysfunction)   . GERD (gastroesophageal reflux disease)   . Hx of adenomatous colonic polyps 07/15/2017  . Hyperlipidemia   . Hypertension   . Normal nuclear stress test    myoview neg 12 09 except for HT  . Other and unspecified  alcohol dependence, unspecified drinking behavior    date of last use 02/13/11  . Prostate cancer ([place]) 02/2017   stage t1c, low risk and asymptomatic followed at [facility]  . Proteinuria    past record showed 500mg  protein excretion per 24 hours eval by renal  . PTSD (post-traumatic stress disorder)    from [place] experience [place]  . S/P dilatation of [facility] stricture    [facility]    Family History  Problem Relation Age of Onset  . Diabetes Mother   . Hypertension Mother   . Other Mother        nerves  . Throat cancer Father        deceased  . Coronary artery disease Sister   . Diabetes Sister   . Heart disease Sister   . Hypertension Brother   . Alcohol abuse Paternal Aunt   . Alcohol abuse Cousin   . Drug abuse Son        cocaine dependent    Social History   Socioeconomic History  . Marital status: Legally Separated    Spouse name: Not on file  . Number of children: Not on file  . Years of education: Not on file  . Highest education level: Not  on file  Occupational History  . 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 Smoking status: Former Smoker    Packs/day: 0.50    Years: 40.00    Pack years: 20.00    Types: Cigarettes    Quit date: 04/15/2009    Years since quitting: 11.2  . Smokeless tobacco: Never Used  Vaping Use  . Vaping Use: Never used  Substance and Sexual Activity  . Alcohol use: Yes    Alcohol/week: 50.0 standard drinks    Types: 50 Shots of liquor per week    Comment: Pt in treatment for alcohol dependence. Last date of use 02/13/11.,  Very very little 01/27/13  . Drug use: No  . Sexual activity: Not on file  Other Topics Concern  . 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   Working with disabled adults.    Playedgolf  Hunting in past          Social Determinants of Health   Financial Resource Strain: Not on file  Food Insecurity: Not on file  Transportation Needs: Not on file  Physical Activity: Not on file  Stress: Not on file  Social Connections: Not on file       EXAM:  BP (!) 90/58 (BP Location: Right Arm, Patient Position: Sitting, Cuff Size: Large)   Pulse 86   Temp 98.2 F (36.8 C) (Oral)   Ht 5\' 11"  (1.803 m)   Wt 196 lb 9.6 oz (89.2 kg)   SpO2 96%   BMI 27.42 kg/m   Body mass index is 27.42 kg/m.  GENERAL: vitals reviewed and listed above, alert, oriented, appears well hydrated and in no acute distress HEENT: atraumatic, conjunctiva  clear, no obvious abnormalities on inspection of external nose and ears OP : Masked NECK: no obvious masses on inspection palpation  LUNGS: clear to auscultation bilaterally, no wheezes, rales or rhonchi, good air movement CV: HRRR, no clubbing cyanosis or  peripheral edema nl cap refill  MS: moves all extremities without noticeable focal  Abnormality Gait to walk without assistance gets up slowly walks pretty steady at this time nonfocal neuro.  Orthostatic blood pressures not checked here today. PSYCH: pleasant and cooperative, normal conversation but wife fills in the details.  Memory not formally checked today  BP Readings from Last 3 Encounters:  07/25/20 (!) 90/58  01/27/19 (!) 150/70  12/31/17 (!) 122/50  Viewed labs that were available on care everywhere from January hemoglobin was in the 8 range.  ASSESSMENT AND PLAN:  Discussed the following assessment and plan:  Other fatigue  Hypotension, unspecified hypotension type  Anemia in chronic kidney disease, unspecified CKD stage  ESRD (end stage renal disease) on dialysis ([place])  Unsteady gait - appears pretty steady  today in office  Unfortunately I do not have records from the [place] but some  lab work was available online from January.  On the renal transplant team Blood pressure repeat today was 114/50 right arm sitting.  Pulse 90. I suggest check readings blood pressure before and after hydralazine for 5 days.  If  dropping significantly consideration of a plan to either half the dose or hold the dose temporarily. This management should be done by the [place] primary team who is prescribing his medicine. I am in a disadvantage without the other information.  It appears that the physical therapist feels uncomfortable with his vital signs and condition to proceed with physical therapy. He should get a appointment at some point with his primary care team who is managing his medicines. In the meantime I will send a copy hopefully by fax to his PCP team. They can may be reach out about medication management in case that is adding to his fatigue.  Patient and wife are aware that blood pressure can be low around the dialysis time but on all other days may be meds can be adjusted.  -Patient advised to return or notify health care team  if  new concerns arise. Review interview update exam plan fax 55 minutes Patient Instructions  I will send info to Dr Theda Sers fax   Check Bp readings before and after hydralazine for 5 days and then as needed.  Consideration if  Decrease the dose of  Hydralazine would help fatigue when bp is low .  And  Then fu with them .   I agree if PT is  Concerned need more   Evaluation or   Direction   Moving forward.     Standley Brooking. Jeyren Danowski M.D. Dr. Latricia Heft fax [phone] [place] [phone] ext [phone]

## 2020-07-27 ENCOUNTER — Telehealth: Payer: Self-pay

## 2020-07-27 NOTE — Telephone Encounter (Signed)
Form faxed on 04/14.

## 2020-07-27 NOTE — Telephone Encounter (Signed)
Progress notes faxed on 04/14

## 2020-07-27 NOTE — Telephone Encounter (Signed)
Patient called back and wanted to let the provider know that the fax number for Dr. Phillips Odor  is [phone], please advise. CB is [phone]

## 2020-07-27 NOTE — Telephone Encounter (Signed)
-----   Message from [name], MD sent at 07/26/2020  6:54 PM EDT ----- Please fax note to DrTapp [phone] at the [facility]

## 2020-07-27 NOTE — Telephone Encounter (Signed)
Patient informed that his forms have been faxed to Dr. Theda Sers

## 2020-07-30 DIAGNOSIS — E873 Alkalosis: Secondary | ICD-10-CM | POA: Diagnosis not present

## 2020-07-30 DIAGNOSIS — Z4822 Encounter for aftercare following kidney transplant: Secondary | ICD-10-CM | POA: Diagnosis not present

## 2020-07-30 DIAGNOSIS — E876 Hypokalemia: Secondary | ICD-10-CM | POA: Diagnosis not present

## 2020-07-30 DIAGNOSIS — R001 Bradycardia, unspecified: Secondary | ICD-10-CM | POA: Diagnosis not present

## 2020-07-30 DIAGNOSIS — D649 Anemia, unspecified: Secondary | ICD-10-CM | POA: Diagnosis not present

## 2020-07-30 DIAGNOSIS — E872 Acidosis: Secondary | ICD-10-CM | POA: Diagnosis not present

## 2020-07-30 DIAGNOSIS — T8619 Other complication of kidney transplant: Secondary | ICD-10-CM | POA: Diagnosis not present

## 2020-07-30 DIAGNOSIS — Z574 Occupational exposure to toxic agents in agriculture: Secondary | ICD-10-CM | POA: Diagnosis not present

## 2020-07-30 DIAGNOSIS — I120 Hypertensive chronic kidney disease with stage 5 chronic kidney disease or end stage renal disease: Secondary | ICD-10-CM | POA: Diagnosis not present

## 2020-07-30 DIAGNOSIS — N186 End stage renal disease: Secondary | ICD-10-CM | POA: Diagnosis not present

## 2020-07-30 DIAGNOSIS — D631 Anemia in chronic kidney disease: Secondary | ICD-10-CM | POA: Diagnosis not present

## 2020-07-30 DIAGNOSIS — Z20822 Contact with and (suspected) exposure to covid-19: Secondary | ICD-10-CM | POA: Diagnosis not present

## 2020-07-30 DIAGNOSIS — D849 Immunodeficiency, unspecified: Secondary | ICD-10-CM | POA: Diagnosis not present

## 2020-07-30 DIAGNOSIS — J9811 Atelectasis: Secondary | ICD-10-CM | POA: Diagnosis not present

## 2020-07-30 DIAGNOSIS — J9601 Acute respiratory failure with hypoxia: Secondary | ICD-10-CM | POA: Diagnosis not present

## 2020-07-30 DIAGNOSIS — I10 Essential (primary) hypertension: Secondary | ICD-10-CM | POA: Diagnosis not present

## 2020-07-30 DIAGNOSIS — Z7682 Awaiting organ transplant status: Secondary | ICD-10-CM | POA: Diagnosis not present

## 2020-07-30 DIAGNOSIS — E8770 Fluid overload, unspecified: Secondary | ICD-10-CM | POA: Diagnosis not present

## 2020-07-30 DIAGNOSIS — Z940 Kidney transplant status: Secondary | ICD-10-CM | POA: Diagnosis not present

## 2020-07-30 DIAGNOSIS — R34 Anuria and oliguria: Secondary | ICD-10-CM | POA: Diagnosis not present

## 2020-07-30 DIAGNOSIS — R918 Other nonspecific abnormal finding of lung field: Secondary | ICD-10-CM | POA: Diagnosis not present

## 2020-07-30 DIAGNOSIS — D62 Acute posthemorrhagic anemia: Secondary | ICD-10-CM | POA: Diagnosis not present

## 2020-07-30 DIAGNOSIS — R7989 Other specified abnormal findings of blood chemistry: Secondary | ICD-10-CM | POA: Diagnosis not present

## 2020-07-30 DIAGNOSIS — T65891S Toxic effect of other specified substances, accidental (unintentional), sequela: Secondary | ICD-10-CM | POA: Diagnosis not present

## 2020-07-30 DIAGNOSIS — Z01818 Encounter for other preprocedural examination: Secondary | ICD-10-CM | POA: Diagnosis not present

## 2020-07-30 DIAGNOSIS — N2581 Secondary hyperparathyroidism of renal origin: Secondary | ICD-10-CM | POA: Diagnosis not present

## 2020-07-31 ENCOUNTER — Telehealth: Payer: Self-pay | Admitting: Internal Medicine

## 2020-07-31 NOTE — Progress Notes (Unsigned)
Subjective:   RORIK VESPA is a 75 y.o. male who presents for Medicare Annual/Subsequent preventive examination.   Virtual Visit via Video Note  I connected with Nonda Lou  by a video enabled telemedicine application and verified that I am speaking with the correct person using two identifiers.  Location: Patient: Home Provider: Office Persons participating in the virtual visit: patient, provider   I discussed the limitations of evaluation and management by telemedicine and the availability of in person appointments. The patient expressed understanding and agreed to proceed.     Mickel Baas Mariadelaluz [place],LPN   Review of Systems   n/a       Objective:    There were no vitals filed for this visit. There is no height or weight on file to calculate BMI.  Advanced Directives 02/05/2017 10/18/2016 10/17/2016 09/18/2016 04/01/2016 05/04/2014 05/04/2014  Does Patient Have a Medical Advance Directive? No Yes Yes No No No No  Type of Advance Directive - Living will;Healthcare Power of [place];Living will - - - -  Does patient want to make changes to medical advance directive? - No - Patient declined - - - - -  Copy of [place] in Chart? - No - copy requested - - - - -  Would patient like information on creating a medical advance directive? Yes (ED - Information included in AVS) - - - - No - patient declined information No - patient declined information    Current Medications (verified) Outpatient Encounter Medications as of 08/01/2020  Medication Sig  . amLODipine (NORVASC) 5 MG tablet Take 1 tablet by mouth daily.  Marland Kitchen atorvastatin (LIPITOR) 20 MG tablet TAKE ONE TABLET BY MOUTH AT BEDTIME FOR CHOLESTEROL  . donepezil (ARICEPT) 5 MG tablet TAKE ONE TABLET BY MOUTH AS DIRECTED BY YOUR MEDICAL PROVIDER -  TAKE ONE TABLET BY MOUTH AT BEDTIME FOR 14 DAYS THEN TAKE TWO TABLETS BY  MOUTH AT BEDTIME FOR 14 DAYS THEN TAKE THREE TABLETS BY MOUTH AT BEDTIME   FOR 14 DAYS THEN TAKE FOUR TABLETS BY MOUTH AT BEDTIME -   TAKE ONE TABLET BY MOUTH AT BEDTIME FOR 14 DAYS THEN TAKE TWO TABLETS BY  MOUTH AT BEDTIME FOR 14 DAYS THEN TAKE THREE TABLETS BY MOUTH AT BEDTIME  FOR 14 DAYS THEN TAKE FOUR TABLETS BY MOUTH AT BEDTIME  . hydrALAZINE (APRESOLINE) 50 MG tablet Take 50 mg by mouth 3 (three) times daily.   . hydroxypropyl methylcellulose / hypromellose (ISOPTO TEARS / GONIOVISC) 2.5 % ophthalmic solution Place 1 drop into both eyes 2 (two) times daily.  Marland Kitchen ipratropium (ATROVENT) 0.03 % nasal spray Place 2 sprays into both nostrils every 12 (twelve) hours.  . Megestrol Acetate (MEGACE ORAL PO) Take by mouth.  . memantine (NAMENDA) 10 MG tablet Take by mouth. Take 1/2 tab x 2 weeks, then 1 tab x 2 weeks, then 1.5 tab x 2 weeks then 2 tab daily to continue.  Marland Kitchen omeprazole (PRILOSEC) 20 MG capsule Take 1 capsule by mouth daily.  Marland Kitchen telmisartan (MICARDIS) 40 MG tablet TAKE 8 TABLETS BY MOUTH ONCE EVERY DAY  . traZODone (DESYREL) 50 MG tablet Take 25 mg by mouth at bedtime as needed for sleep.    Facility-Administered Encounter Medications as of 08/01/2020  Medication  . 0.9 %  sodium chloride infusion    Allergies (verified) Codeine phosphate and Lisinopril   History: Past Medical History:  Diagnosis Date  . Allergy   . Arthritis   .  Cataract   . Diabetes mellitus without complication ([place])   . ED (erectile dysfunction)   . GERD (gastroesophageal reflux disease)   . Hx of adenomatous colonic polyps 07/15/2017  . Hyperlipidemia   . Hypertension   . Normal nuclear stress test    myoview neg 12 09 except for HT  . Other and unspecified alcohol dependence, unspecified drinking behavior    date of last use 02/13/11  . Prostate cancer ([place]) 02/2017   stage t1c, low risk and asymptomatic followed at [facility]  . Proteinuria    past record showed 500mg  protein excretion per 24 hours eval by renal  . PTSD (post-traumatic stress disorder)    from [place]  experience [place]  . S[facility] strict[facility]    [facility]   [facility] Surgical History:  Procedure Laterality Date  . ls spinal surgery    . NM MYOVIEW LTD  12/09   neg except for HT  . PROSTATE BIOPSY  2018   stage t1c cancer  . TRIGGER FINGER RELEASE Left 10/18/2016   Procedure: LEFT TRIGGER THUMB RELEASE;  Surgeon: Milly Jakob, MD;  Location: [facility];  Service: Orthopedics;  Laterality: Left;   Family History  Problem Relation Age of Onset  . Diabetes Mother   . Hypertension Mother   . Other Mother        nerves  . Throat cancer Father        deceased  . Coronary artery disease Sister   . Diabetes Sister   . Heart disease Sister   . Hypertension Brother   . Alcohol abuse Paternal Aunt   . Alcohol abuse Cousin   . Drug abuse Son        cocaine dependent   Social History   Socioeconomic History  . Marital status: Legally Separated    Spouse name: Not on file  . Number of children: Not on file  . Years of education: Not on file  . Highest education level: Not on file  Occupational History  . 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 Smoking status: Former Smoker    Packs/day: 0.50    Years: 40.00    Pack years: 20.00    Types: Cigarettes    Quit date: 04/15/2009    Years since quitting: 11.3  . Smokeless tobacco: Never Used  Vaping Use  . Vaping Use: Never used  Substance and Sexual Activity  . Alcohol use: Yes    Alcohol/week: 50.0 standard drinks    Types: 50 Shots of liquor per week    Comment: Pt in treatment for alcohol dependence. Last date of use 02/13/11.,  Very very little 01/27/13  . Drug use: No  . Sexual activity: Not on file  Other Topics Concern  . 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   Working with disabled adults.    Playedgolf  Hunting in past          Social Determinants of Health   Financial Resource Strain: Not on file  Food Insecurity: Not on file  Transportation Needs: Not on file  Physical Activity: Not on file  Stress: Not on file  Social Connections: Not on file    Tobacco Counseling Counseling given: Not Answered   Clinical Intake:                 Diabetic?yes  Nutrition Risk Assessment:  Has the patient had any N/V/D within the last 2 months?  No  Does the patient have any non-healing wounds?  No  Has the patient had any unintentional weight loss or weight gain?  No   Diabetes:  Is the patient diabetic?  Yes  If diabetic, was a CBG obtained today?  No  Did the patient bring in their glucometer from home?  No  How often do you monitor your CBG's? ***.   Financial Strains and Diabetes Management:  Are you having any financial strains with the device, your supplies or your medication? No .  Does the patient want to be seen by Chronic Care Management for management of their diabetes?  No  Would the patient like to be referred to a Nutritionist or for Diabetic Management?  No   Diabetic Exams:  Diabetic Eye Exam: Overdue for diabetic eye exam. Pt has been advised about the importance in completing this exam. Patient advised to call and schedule an eye exam. Diabetic Foot Exam: Overdue, Pt has been advised about the importance in completing this exam. Pt is scheduled for diabetic foot exam on not office visit .          Activities of Daily Living No flowsheet data found.  Patient Care Team: Panosh, Standley Brooking, MD as PCP - General Corliss Parish, MD (Nephrology) Psychiatrist and PCP at the [facility], MD as Consulting Physician (Neurosurgery) Vanita Ingles, MD as Consulting Physician (Cardiology) Latricia Heft as  Attending Physician  Indicate any recent Medical Services you may have received from other than [facility] providers in the past year (date may be approximate).     Assessment:   This is a routine wellness examination for [place].  Hearing/Vision screen No exam data present  Dietary issues and exercise activities discussed:    Goals    . Exercise 150 minutes per week (moderate activity)     Start exercising in the pool It is great therapy as well as easy on your joints Will go 3 times a week; don't aggravate the hip. Walking in the pool 30 minutes is equal to 2 hours on the ground    . Patient Stated     Set a goal to get a kidney !      Depression Screen PHQ 2/9 Scores 01/05/2019 09/16/2017 09/18/2016 06/06/2016 05/16/2016 05/04/2014 05/04/2014  PHQ - 2 Score 0 1 0 0 0 0 0  Exception Documentation - - - Other- indicate reason in comment box - - -    Fall Risk Fall Risk  01/05/2019 09/16/2017 09/18/2016 06/06/2016 05/16/2016  Falls in the past year? 0 No No No No  Number falls in past yr: 0 - - - -  Injury with Fall? 0 - - - -  Risk for fall due to : - - - Other (Comment) -    FALL RISK PREVENTION PERTAINING TO THE HOME:  Any stairs in or around the home? {YES/NO:21197} If so, are there any without handrails? Yes  Home free of loose throw rugs in walkways, pet beds, electrical cords, etc? Yes  Adequate lighting in your home to reduce risk of  falls? Yes   ASSISTIVE DEVICES UTILIZED TO PREVENT FALLS:  Life alert? {YES/NO:21197} Use of a cane, walker or w/c? {YES/NO:21197} Grab bars in the bathroom? {YES/NO:21197} Shower chair or bench in shower? {YES/NO:21197} Elevated toilet seat or a handicapped toilet? {YES/NO:21197}  Cognitive Function: Normal cognitive status assessed by direct observation by this Nurse Health Advisor. No abnormalities found.   MMSE - Mini Mental State Exam 09/16/2017 09/18/2016  Not completed: (No Data) -  Orientation to time - 5  Orientation to Place - 5   Registration - 3  Attention/ Calculation - 5  Recall - 1  Language- name 2 objects - 2  Language- repeat - 1  Language- follow 3 step command - 3  Language- read & follow direction - 1  Write a sentence - 1  Copy design - 0  Total score - 27        Immunizations Immunization History  Administered Date(s) Administered  . Hepatitis B 08/06/2005  . Influenza Split 01/21/2011, 01/13/2014  . Influenza Whole 02/08/2009, 01/11/2010  . Influenza, High Dose Seasonal PF 01/13/2017, 12/31/2017  . Influenza-Unspecified 12/14/2012  . Pneumococcal Conjugate-13 09/14/2013, 01/25/2014  . Td 12/07/2002, 04/20/2014    TDAP status: Up to date  Flu Vaccine status: Up to date  Pneumococcal vaccine status: Up to date  Covid-19 vaccine status: Declined, Education has been provided regarding the importance of this vaccine but patient still declined. Advised may receive this vaccine at local pharmacy or Health Dept.or vaccine clinic. Aware to provide a copy of the vaccination record if obtained from local pharmacy or Health Dept. Verbalized acceptance and understanding.  Qualifies for Shingles Vaccine? Yes   Zostavax completed No   Shingrix Completed?: No.    Education has been provided regarding the importance of this vaccine. Patient has been advised to call insurance company to determine out of pocket expense if they have not yet received this vaccine. Advised may also receive vaccine at local pharmacy or Health Dept. Verbalized acceptance and understanding.  Screening Tests Health Maintenance  Topic Date Due  . HEMOGLOBIN A1C  07/28/2019  . FOOT EXAM  01/27/2020  . COLONOSCOPY (Pts 45-70yrs Insurance coverage will need to be confirmed)  07/08/2020  . COVID-19 Vaccine (4 - Booster for Moderna series) 08/07/2020  . INFLUENZA VACCINE  11/13/2020  . OPHTHALMOLOGY EXAM  12/15/2020  . TETANUS/TDAP  04/20/2024  . PNA vac Low Risk Adult  Completed  . HPV VACCINES  Aged Out    Health  Maintenance  Health Maintenance Due  Topic Date Due  . HEMOGLOBIN A1C  07/28/2019  . FOOT EXAM  01/27/2020  . COLONOSCOPY (Pts 45-85yrs Insurance coverage will need to be confirmed)  07/08/2020    Colorectal cancer screening: Referral to GI placed 08/01/2020. Pt aware the office will call re: appt.  Lung Cancer Screening: (Low Dose CT Chest recommended if Age 20-80 years, 30 pack-year currently smoking OR have quit w/in 15years.) does qualify.   Lung Cancer Screening Referral: yes  Additional Screening:  Hepatitis C Screening: does qualify  Vision Screening: Recommended annual ophthalmology exams for early detection of glaucoma and other disorders of the eye. Is the patient up to date with their annual eye exam?  {YES/NO:21197} Who is the provider or what is the name of the office in which the patient attends annual eye exams? *** If pt is not established with a provider, would they like to be referred to a provider to establish care? {YES/NO:21197}.   Dental Screening: Recommended annual  dental exams for proper oral hygiene  Community Resource Referral / Chronic Care Management: CRR required this visit?  No   CCM required this visit?  No      Plan:     I have personally reviewed and noted the following in the patient's chart:   . Medical and social history . Use of alcohol, tobacco or illicit drugs  . Current medications and supplements . Functional ability and status . Nutritional status . Physical activity . Advanced directives . List of other physicians . Hospitalizations, surgeries, and ER visits in previous 12 months . Vitals . Screenings to include cognitive, depression, and falls . Referrals and appointments  In addition, I have reviewed and discussed with patient certain preventive protocols, quality metrics, and best practice recommendations. A written personalized care plan for preventive services as well as general preventive health recommendations were  provided to patient.     Randel Pigg, LPN   [id number]/[id number]   Nurse Notes: none

## 2020-07-31 NOTE — Telephone Encounter (Signed)
Patient's wife Bertram Millard is calling to advise the patient received a kidney transplant on 07/30/20.  FYI

## 2020-07-31 NOTE — Telephone Encounter (Signed)
So that is great !hope things go well.  Let us know if we can help.

## 2020-08-01 ENCOUNTER — Ambulatory Visit: Payer: Medicare PPO

## 2020-08-01 ENCOUNTER — Telehealth: Payer: Self-pay

## 2020-08-01 DIAGNOSIS — Z122 Encounter for screening for malignant neoplasm of respiratory organs: Secondary | ICD-10-CM

## 2020-08-01 DIAGNOSIS — Z1211 Encounter for screening for malignant neoplasm of colon: Secondary | ICD-10-CM

## 2020-08-01 DIAGNOSIS — Z0000 Encounter for general adult medical examination without abnormal findings: Secondary | ICD-10-CM

## 2020-08-01 NOTE — Telephone Encounter (Cosign Needed)
Pt scheduled for AWV  0815 ,  No virtual connection with pt attempted to call pt 3 times via telephone [phone] to reach to complete visit. Phone automatically  Goes to vm, left vm message for pt to call and reschedule AWV with Nurse health advisor.    Mickel Baas Quasim Doyon,LPN

## 2020-08-01 NOTE — Telephone Encounter (Signed)
Noted  

## 2020-08-01 NOTE — Telephone Encounter (Signed)
Message from wife that  He just had a renal transplant this week so I would   Take him off the list for now   He gets his care at the [place]  And [place] as needed.

## 2020-08-07 DIAGNOSIS — E876 Hypokalemia: Secondary | ICD-10-CM | POA: Diagnosis not present

## 2020-08-07 DIAGNOSIS — Z4822 Encounter for aftercare following kidney transplant: Secondary | ICD-10-CM | POA: Diagnosis not present

## 2020-08-07 DIAGNOSIS — R413 Other amnesia: Secondary | ICD-10-CM | POA: Diagnosis not present

## 2020-08-07 DIAGNOSIS — I10 Essential (primary) hypertension: Secondary | ICD-10-CM | POA: Diagnosis not present

## 2020-08-07 DIAGNOSIS — Z5181 Encounter for therapeutic drug level monitoring: Secondary | ICD-10-CM | POA: Diagnosis not present

## 2020-08-07 DIAGNOSIS — I4510 Unspecified right bundle-branch block: Secondary | ICD-10-CM | POA: Diagnosis not present

## 2020-08-07 DIAGNOSIS — D849 Immunodeficiency, unspecified: Secondary | ICD-10-CM | POA: Diagnosis not present

## 2020-08-07 DIAGNOSIS — Z940 Kidney transplant status: Secondary | ICD-10-CM | POA: Diagnosis not present

## 2020-08-07 DIAGNOSIS — Z79899 Other long term (current) drug therapy: Secondary | ICD-10-CM | POA: Diagnosis not present

## 2020-08-09 ENCOUNTER — Telehealth: Payer: Self-pay | Admitting: Cardiovascular Disease

## 2020-08-09 NOTE — Telephone Encounter (Signed)
New Message:     Laurey Arrow from [facility] called. He wants to know what the pt's weight perimeter is please. Please fax to [phone]

## 2020-08-09 NOTE — Telephone Encounter (Signed)
[facility] with [facility], notified him that this patient has not been seen by Dr.Nishan.  Our records show that he has seen cardiology with [facility].  He apologized for call.

## 2020-08-10 DIAGNOSIS — E119 Type 2 diabetes mellitus without complications: Secondary | ICD-10-CM | POA: Diagnosis not present

## 2020-08-10 DIAGNOSIS — E876 Hypokalemia: Secondary | ICD-10-CM | POA: Diagnosis not present

## 2020-08-10 DIAGNOSIS — Z131 Encounter for screening for diabetes mellitus: Secondary | ICD-10-CM | POA: Diagnosis not present

## 2020-08-10 DIAGNOSIS — F0390 Unspecified dementia without behavioral disturbance: Secondary | ICD-10-CM | POA: Diagnosis not present

## 2020-08-10 DIAGNOSIS — I10 Essential (primary) hypertension: Secondary | ICD-10-CM | POA: Diagnosis not present

## 2020-08-10 DIAGNOSIS — E785 Hyperlipidemia, unspecified: Secondary | ICD-10-CM | POA: Diagnosis not present

## 2020-08-10 DIAGNOSIS — Z79899 Other long term (current) drug therapy: Secondary | ICD-10-CM | POA: Diagnosis not present

## 2020-08-10 DIAGNOSIS — R413 Other amnesia: Secondary | ICD-10-CM | POA: Diagnosis not present

## 2020-08-10 DIAGNOSIS — Z7952 Long term (current) use of systemic steroids: Secondary | ICD-10-CM | POA: Diagnosis not present

## 2020-08-10 DIAGNOSIS — Z792 Long term (current) use of antibiotics: Secondary | ICD-10-CM | POA: Diagnosis not present

## 2020-08-10 DIAGNOSIS — D84821 Immunodeficiency due to drugs: Secondary | ICD-10-CM | POA: Diagnosis not present

## 2020-08-10 DIAGNOSIS — Z940 Kidney transplant status: Secondary | ICD-10-CM | POA: Diagnosis not present

## 2020-08-10 DIAGNOSIS — Z4822 Encounter for aftercare following kidney transplant: Secondary | ICD-10-CM | POA: Diagnosis not present

## 2020-08-10 DIAGNOSIS — D649 Anemia, unspecified: Secondary | ICD-10-CM | POA: Diagnosis not present

## 2020-08-10 DIAGNOSIS — F4310 Post-traumatic stress disorder, unspecified: Secondary | ICD-10-CM | POA: Diagnosis not present

## 2020-08-14 DIAGNOSIS — D649 Anemia, unspecified: Secondary | ICD-10-CM | POA: Diagnosis not present

## 2020-08-14 DIAGNOSIS — E876 Hypokalemia: Secondary | ICD-10-CM | POA: Diagnosis not present

## 2020-08-14 DIAGNOSIS — Z131 Encounter for screening for diabetes mellitus: Secondary | ICD-10-CM | POA: Diagnosis not present

## 2020-08-14 DIAGNOSIS — I10 Essential (primary) hypertension: Secondary | ICD-10-CM | POA: Diagnosis not present

## 2020-08-14 DIAGNOSIS — Z940 Kidney transplant status: Secondary | ICD-10-CM | POA: Diagnosis not present

## 2020-08-14 DIAGNOSIS — Z792 Long term (current) use of antibiotics: Secondary | ICD-10-CM | POA: Diagnosis not present

## 2020-08-14 DIAGNOSIS — Z5181 Encounter for therapeutic drug level monitoring: Secondary | ICD-10-CM | POA: Diagnosis not present

## 2020-08-14 DIAGNOSIS — D849 Immunodeficiency, unspecified: Secondary | ICD-10-CM | POA: Diagnosis not present

## 2020-08-14 DIAGNOSIS — I4510 Unspecified right bundle-branch block: Secondary | ICD-10-CM | POA: Diagnosis not present

## 2020-08-14 DIAGNOSIS — F0390 Unspecified dementia without behavioral disturbance: Secondary | ICD-10-CM | POA: Diagnosis not present

## 2020-08-14 DIAGNOSIS — Z79899 Other long term (current) drug therapy: Secondary | ICD-10-CM | POA: Diagnosis not present

## 2020-08-14 DIAGNOSIS — E785 Hyperlipidemia, unspecified: Secondary | ICD-10-CM | POA: Diagnosis not present

## 2020-08-14 DIAGNOSIS — Z4822 Encounter for aftercare following kidney transplant: Secondary | ICD-10-CM | POA: Diagnosis not present

## 2020-08-14 DIAGNOSIS — R413 Other amnesia: Secondary | ICD-10-CM | POA: Diagnosis not present

## 2020-08-17 ENCOUNTER — Telehealth: Payer: Medicare Other | Admitting: Internal Medicine

## 2020-08-17 DIAGNOSIS — R001 Bradycardia, unspecified: Secondary | ICD-10-CM | POA: Diagnosis not present

## 2020-08-17 DIAGNOSIS — Z574 Occupational exposure to toxic agents in agriculture: Secondary | ICD-10-CM | POA: Diagnosis not present

## 2020-08-17 DIAGNOSIS — I10 Essential (primary) hypertension: Secondary | ICD-10-CM | POA: Diagnosis not present

## 2020-08-17 DIAGNOSIS — D649 Anemia, unspecified: Secondary | ICD-10-CM | POA: Diagnosis not present

## 2020-08-17 DIAGNOSIS — D8989 Other specified disorders involving the immune mechanism, not elsewhere classified: Secondary | ICD-10-CM | POA: Diagnosis not present

## 2020-08-17 DIAGNOSIS — Z940 Kidney transplant status: Secondary | ICD-10-CM | POA: Diagnosis not present

## 2020-08-17 DIAGNOSIS — I4510 Unspecified right bundle-branch block: Secondary | ICD-10-CM | POA: Diagnosis not present

## 2020-08-17 DIAGNOSIS — E785 Hyperlipidemia, unspecified: Secondary | ICD-10-CM | POA: Diagnosis not present

## 2020-08-17 DIAGNOSIS — Z4822 Encounter for aftercare following kidney transplant: Secondary | ICD-10-CM | POA: Diagnosis not present

## 2020-08-17 DIAGNOSIS — F0390 Unspecified dementia without behavioral disturbance: Secondary | ICD-10-CM | POA: Diagnosis not present

## 2020-08-17 DIAGNOSIS — R4189 Other symptoms and signs involving cognitive functions and awareness: Secondary | ICD-10-CM | POA: Diagnosis not present

## 2020-08-17 DIAGNOSIS — E876 Hypokalemia: Secondary | ICD-10-CM | POA: Diagnosis not present

## 2020-08-17 DIAGNOSIS — Z131 Encounter for screening for diabetes mellitus: Secondary | ICD-10-CM | POA: Diagnosis not present

## 2020-08-21 DIAGNOSIS — R3989 Other symptoms and signs involving the genitourinary system: Secondary | ICD-10-CM | POA: Diagnosis not present

## 2020-08-21 DIAGNOSIS — R4182 Altered mental status, unspecified: Secondary | ICD-10-CM | POA: Diagnosis not present

## 2020-08-21 DIAGNOSIS — I10 Essential (primary) hypertension: Secondary | ICD-10-CM | POA: Diagnosis not present

## 2020-08-21 DIAGNOSIS — R159 Full incontinence of feces: Secondary | ICD-10-CM | POA: Diagnosis not present

## 2020-08-21 DIAGNOSIS — F0390 Unspecified dementia without behavioral disturbance: Secondary | ICD-10-CM | POA: Diagnosis not present

## 2020-08-21 DIAGNOSIS — N186 End stage renal disease: Secondary | ICD-10-CM | POA: Diagnosis not present

## 2020-08-21 DIAGNOSIS — Z940 Kidney transplant status: Secondary | ICD-10-CM | POA: Diagnosis not present

## 2020-08-21 DIAGNOSIS — R399 Unspecified symptoms and signs involving the genitourinary system: Secondary | ICD-10-CM | POA: Diagnosis not present

## 2020-08-21 DIAGNOSIS — R000 Tachycardia, unspecified: Secondary | ICD-10-CM | POA: Diagnosis not present

## 2020-08-21 DIAGNOSIS — I120 Hypertensive chronic kidney disease with stage 5 chronic kidney disease or end stage renal disease: Secondary | ICD-10-CM | POA: Diagnosis not present

## 2020-08-21 DIAGNOSIS — D849 Immunodeficiency, unspecified: Secondary | ICD-10-CM | POA: Diagnosis not present

## 2020-08-21 DIAGNOSIS — Z79899 Other long term (current) drug therapy: Secondary | ICD-10-CM | POA: Diagnosis not present

## 2020-08-21 DIAGNOSIS — Z77098 Contact with and (suspected) exposure to other hazardous, chiefly nonmedicinal, chemicals: Secondary | ICD-10-CM | POA: Diagnosis not present

## 2020-08-22 DIAGNOSIS — R000 Tachycardia, unspecified: Secondary | ICD-10-CM | POA: Diagnosis not present

## 2020-08-22 DIAGNOSIS — I120 Hypertensive chronic kidney disease with stage 5 chronic kidney disease or end stage renal disease: Secondary | ICD-10-CM | POA: Diagnosis not present

## 2020-08-22 DIAGNOSIS — R159 Full incontinence of feces: Secondary | ICD-10-CM | POA: Diagnosis not present

## 2020-08-22 DIAGNOSIS — F0390 Unspecified dementia without behavioral disturbance: Secondary | ICD-10-CM | POA: Diagnosis not present

## 2020-08-22 DIAGNOSIS — Z77098 Contact with and (suspected) exposure to other hazardous, chiefly nonmedicinal, chemicals: Secondary | ICD-10-CM | POA: Diagnosis not present

## 2020-08-22 DIAGNOSIS — Z940 Kidney transplant status: Secondary | ICD-10-CM | POA: Diagnosis not present

## 2020-08-22 DIAGNOSIS — N186 End stage renal disease: Secondary | ICD-10-CM | POA: Diagnosis not present

## 2020-08-22 DIAGNOSIS — R4182 Altered mental status, unspecified: Secondary | ICD-10-CM | POA: Diagnosis not present

## 2020-08-22 DIAGNOSIS — R399 Unspecified symptoms and signs involving the genitourinary system: Secondary | ICD-10-CM | POA: Diagnosis not present

## 2020-08-22 DIAGNOSIS — I10 Essential (primary) hypertension: Secondary | ICD-10-CM | POA: Diagnosis not present

## 2020-08-22 DIAGNOSIS — D849 Immunodeficiency, unspecified: Secondary | ICD-10-CM | POA: Diagnosis not present

## 2020-08-24 DIAGNOSIS — Z792 Long term (current) use of antibiotics: Secondary | ICD-10-CM | POA: Diagnosis not present

## 2020-08-24 DIAGNOSIS — I10 Essential (primary) hypertension: Secondary | ICD-10-CM | POA: Diagnosis not present

## 2020-08-24 DIAGNOSIS — Z940 Kidney transplant status: Secondary | ICD-10-CM | POA: Diagnosis not present

## 2020-08-24 DIAGNOSIS — R7303 Prediabetes: Secondary | ICD-10-CM | POA: Diagnosis not present

## 2020-08-24 DIAGNOSIS — Z4822 Encounter for aftercare following kidney transplant: Secondary | ICD-10-CM | POA: Diagnosis not present

## 2020-08-24 DIAGNOSIS — D849 Immunodeficiency, unspecified: Secondary | ICD-10-CM | POA: Diagnosis not present

## 2020-08-24 DIAGNOSIS — D649 Anemia, unspecified: Secondary | ICD-10-CM | POA: Diagnosis not present

## 2020-08-24 DIAGNOSIS — E785 Hyperlipidemia, unspecified: Secondary | ICD-10-CM | POA: Diagnosis not present

## 2020-08-24 DIAGNOSIS — F0390 Unspecified dementia without behavioral disturbance: Secondary | ICD-10-CM | POA: Diagnosis not present

## 2020-08-24 DIAGNOSIS — E876 Hypokalemia: Secondary | ICD-10-CM | POA: Diagnosis not present

## 2020-08-24 DIAGNOSIS — Z8546 Personal history of malignant neoplasm of prostate: Secondary | ICD-10-CM | POA: Diagnosis not present

## 2020-08-28 DIAGNOSIS — R000 Tachycardia, unspecified: Secondary | ICD-10-CM | POA: Diagnosis not present

## 2020-08-28 DIAGNOSIS — R627 Adult failure to thrive: Secondary | ICD-10-CM | POA: Diagnosis not present

## 2020-08-28 DIAGNOSIS — R413 Other amnesia: Secondary | ICD-10-CM | POA: Diagnosis not present

## 2020-08-28 DIAGNOSIS — R059 Cough, unspecified: Secondary | ICD-10-CM | POA: Diagnosis not present

## 2020-08-28 DIAGNOSIS — M47812 Spondylosis without myelopathy or radiculopathy, cervical region: Secondary | ICD-10-CM | POA: Diagnosis not present

## 2020-08-28 DIAGNOSIS — R197 Diarrhea, unspecified: Secondary | ICD-10-CM | POA: Diagnosis not present

## 2020-08-28 DIAGNOSIS — R253 Fasciculation: Secondary | ICD-10-CM | POA: Diagnosis not present

## 2020-08-28 DIAGNOSIS — I10 Essential (primary) hypertension: Secondary | ICD-10-CM | POA: Diagnosis not present

## 2020-08-28 DIAGNOSIS — R519 Headache, unspecified: Secondary | ICD-10-CM | POA: Diagnosis not present

## 2020-08-28 DIAGNOSIS — Z20822 Contact with and (suspected) exposure to covid-19: Secondary | ICD-10-CM | POA: Diagnosis not present

## 2020-08-28 DIAGNOSIS — Y92009 Unspecified place in unspecified non-institutional (private) residence as the place of occurrence of the external cause: Secondary | ICD-10-CM | POA: Diagnosis not present

## 2020-08-28 DIAGNOSIS — D849 Immunodeficiency, unspecified: Secondary | ICD-10-CM | POA: Diagnosis not present

## 2020-08-28 DIAGNOSIS — F0390 Unspecified dementia without behavioral disturbance: Secondary | ICD-10-CM | POA: Diagnosis not present

## 2020-08-28 DIAGNOSIS — W19XXXA Unspecified fall, initial encounter: Secondary | ICD-10-CM | POA: Diagnosis not present

## 2020-08-28 DIAGNOSIS — Z09 Encounter for follow-up examination after completed treatment for conditions other than malignant neoplasm: Secondary | ICD-10-CM | POA: Diagnosis not present

## 2020-08-28 DIAGNOSIS — Z940 Kidney transplant status: Secondary | ICD-10-CM | POA: Diagnosis not present

## 2020-08-29 DIAGNOSIS — R627 Adult failure to thrive: Secondary | ICD-10-CM | POA: Diagnosis not present

## 2020-08-29 DIAGNOSIS — R253 Fasciculation: Secondary | ICD-10-CM | POA: Diagnosis not present

## 2020-08-29 DIAGNOSIS — Z940 Kidney transplant status: Secondary | ICD-10-CM | POA: Diagnosis not present

## 2020-08-29 DIAGNOSIS — Z09 Encounter for follow-up examination after completed treatment for conditions other than malignant neoplasm: Secondary | ICD-10-CM | POA: Diagnosis not present

## 2020-08-29 DIAGNOSIS — R000 Tachycardia, unspecified: Secondary | ICD-10-CM | POA: Diagnosis not present

## 2020-08-29 DIAGNOSIS — R509 Fever, unspecified: Secondary | ICD-10-CM | POA: Diagnosis not present

## 2020-08-29 DIAGNOSIS — Z79899 Other long term (current) drug therapy: Secondary | ICD-10-CM | POA: Diagnosis not present

## 2020-08-29 DIAGNOSIS — E872 Acidosis: Secondary | ICD-10-CM | POA: Diagnosis not present

## 2020-08-29 DIAGNOSIS — R079 Chest pain, unspecified: Secondary | ICD-10-CM | POA: Diagnosis not present

## 2020-08-29 DIAGNOSIS — E876 Hypokalemia: Secondary | ICD-10-CM | POA: Diagnosis not present

## 2020-08-29 DIAGNOSIS — R453 Demoralization and apathy: Secondary | ICD-10-CM | POA: Diagnosis not present

## 2020-08-29 DIAGNOSIS — W19XXXA Unspecified fall, initial encounter: Secondary | ICD-10-CM | POA: Diagnosis not present

## 2020-08-29 DIAGNOSIS — R059 Cough, unspecified: Secondary | ICD-10-CM | POA: Diagnosis not present

## 2020-08-29 DIAGNOSIS — Z20822 Contact with and (suspected) exposure to covid-19: Secondary | ICD-10-CM | POA: Diagnosis not present

## 2020-08-29 DIAGNOSIS — R197 Diarrhea, unspecified: Secondary | ICD-10-CM | POA: Diagnosis not present

## 2020-08-29 DIAGNOSIS — I10 Essential (primary) hypertension: Secondary | ICD-10-CM | POA: Diagnosis not present

## 2020-08-29 DIAGNOSIS — D631 Anemia in chronic kidney disease: Secondary | ICD-10-CM | POA: Diagnosis not present

## 2020-08-30 DIAGNOSIS — R253 Fasciculation: Secondary | ICD-10-CM | POA: Diagnosis not present

## 2020-08-30 DIAGNOSIS — Z09 Encounter for follow-up examination after completed treatment for conditions other than malignant neoplasm: Secondary | ICD-10-CM | POA: Diagnosis not present

## 2020-08-30 DIAGNOSIS — R627 Adult failure to thrive: Secondary | ICD-10-CM | POA: Diagnosis not present

## 2020-08-30 DIAGNOSIS — R29818 Other symptoms and signs involving the nervous system: Secondary | ICD-10-CM | POA: Diagnosis not present

## 2020-08-30 DIAGNOSIS — R4789 Other speech disturbances: Secondary | ICD-10-CM | POA: Diagnosis not present

## 2020-08-30 DIAGNOSIS — I10 Essential (primary) hypertension: Secondary | ICD-10-CM | POA: Diagnosis not present

## 2020-08-30 DIAGNOSIS — Z20822 Contact with and (suspected) exposure to covid-19: Secondary | ICD-10-CM | POA: Diagnosis not present

## 2020-08-30 DIAGNOSIS — R059 Cough, unspecified: Secondary | ICD-10-CM | POA: Diagnosis not present

## 2020-08-30 DIAGNOSIS — R000 Tachycardia, unspecified: Secondary | ICD-10-CM | POA: Diagnosis not present

## 2020-08-30 DIAGNOSIS — R197 Diarrhea, unspecified: Secondary | ICD-10-CM | POA: Diagnosis not present

## 2020-08-30 DIAGNOSIS — Z940 Kidney transplant status: Secondary | ICD-10-CM | POA: Diagnosis not present

## 2020-08-31 DIAGNOSIS — Z4822 Encounter for aftercare following kidney transplant: Secondary | ICD-10-CM | POA: Diagnosis not present

## 2020-08-31 DIAGNOSIS — E785 Hyperlipidemia, unspecified: Secondary | ICD-10-CM | POA: Diagnosis not present

## 2020-08-31 DIAGNOSIS — D649 Anemia, unspecified: Secondary | ICD-10-CM | POA: Diagnosis not present

## 2020-08-31 DIAGNOSIS — R059 Cough, unspecified: Secondary | ICD-10-CM | POA: Diagnosis not present

## 2020-08-31 DIAGNOSIS — E872 Acidosis: Secondary | ICD-10-CM | POA: Diagnosis not present

## 2020-08-31 DIAGNOSIS — Z940 Kidney transplant status: Secondary | ICD-10-CM | POA: Diagnosis not present

## 2020-08-31 DIAGNOSIS — M1990 Unspecified osteoarthritis, unspecified site: Secondary | ICD-10-CM | POA: Diagnosis not present

## 2020-08-31 DIAGNOSIS — E876 Hypokalemia: Secondary | ICD-10-CM | POA: Diagnosis not present

## 2020-08-31 DIAGNOSIS — R972 Elevated prostate specific antigen [PSA]: Secondary | ICD-10-CM | POA: Diagnosis not present

## 2020-08-31 DIAGNOSIS — R000 Tachycardia, unspecified: Secondary | ICD-10-CM | POA: Diagnosis not present

## 2020-08-31 DIAGNOSIS — I10 Essential (primary) hypertension: Secondary | ICD-10-CM | POA: Diagnosis not present

## 2020-08-31 DIAGNOSIS — K222 Esophageal obstruction: Secondary | ICD-10-CM | POA: Diagnosis not present

## 2020-08-31 DIAGNOSIS — F0390 Unspecified dementia without behavioral disturbance: Secondary | ICD-10-CM | POA: Diagnosis not present

## 2020-09-04 DIAGNOSIS — Z940 Kidney transplant status: Secondary | ICD-10-CM | POA: Diagnosis not present

## 2020-09-04 DIAGNOSIS — R54 Age-related physical debility: Secondary | ICD-10-CM | POA: Diagnosis not present

## 2020-09-04 DIAGNOSIS — D849 Immunodeficiency, unspecified: Secondary | ICD-10-CM | POA: Diagnosis not present

## 2020-09-04 DIAGNOSIS — N186 End stage renal disease: Secondary | ICD-10-CM | POA: Diagnosis not present

## 2020-09-04 DIAGNOSIS — I10 Essential (primary) hypertension: Secondary | ICD-10-CM | POA: Diagnosis not present

## 2020-09-04 DIAGNOSIS — Z4822 Encounter for aftercare following kidney transplant: Secondary | ICD-10-CM | POA: Diagnosis not present

## 2020-09-04 DIAGNOSIS — I120 Hypertensive chronic kidney disease with stage 5 chronic kidney disease or end stage renal disease: Secondary | ICD-10-CM | POA: Diagnosis not present

## 2020-09-04 DIAGNOSIS — D84821 Immunodeficiency due to drugs: Secondary | ICD-10-CM | POA: Diagnosis not present

## 2020-09-04 DIAGNOSIS — E1122 Type 2 diabetes mellitus with diabetic chronic kidney disease: Secondary | ICD-10-CM | POA: Diagnosis not present

## 2020-09-04 DIAGNOSIS — Z7952 Long term (current) use of systemic steroids: Secondary | ICD-10-CM | POA: Diagnosis not present

## 2020-09-05 DIAGNOSIS — Z960 Presence of urogenital implants: Secondary | ICD-10-CM | POA: Diagnosis not present

## 2020-09-08 DIAGNOSIS — Z5181 Encounter for therapeutic drug level monitoring: Secondary | ICD-10-CM | POA: Diagnosis not present

## 2020-09-08 DIAGNOSIS — Z940 Kidney transplant status: Secondary | ICD-10-CM | POA: Diagnosis not present

## 2020-09-08 DIAGNOSIS — I4510 Unspecified right bundle-branch block: Secondary | ICD-10-CM | POA: Diagnosis not present

## 2020-09-08 DIAGNOSIS — R630 Anorexia: Secondary | ICD-10-CM | POA: Diagnosis not present

## 2020-09-08 DIAGNOSIS — E785 Hyperlipidemia, unspecified: Secondary | ICD-10-CM | POA: Diagnosis not present

## 2020-09-08 DIAGNOSIS — R413 Other amnesia: Secondary | ICD-10-CM | POA: Diagnosis not present

## 2020-09-08 DIAGNOSIS — R55 Syncope and collapse: Secondary | ICD-10-CM | POA: Diagnosis not present

## 2020-09-08 DIAGNOSIS — E876 Hypokalemia: Secondary | ICD-10-CM | POA: Diagnosis not present

## 2020-09-08 DIAGNOSIS — F0390 Unspecified dementia without behavioral disturbance: Secondary | ICD-10-CM | POA: Diagnosis not present

## 2020-09-08 DIAGNOSIS — Z79899 Other long term (current) drug therapy: Secondary | ICD-10-CM | POA: Diagnosis not present

## 2020-09-08 DIAGNOSIS — D849 Immunodeficiency, unspecified: Secondary | ICD-10-CM | POA: Diagnosis not present

## 2020-09-08 DIAGNOSIS — Z4822 Encounter for aftercare following kidney transplant: Secondary | ICD-10-CM | POA: Diagnosis not present

## 2020-09-08 DIAGNOSIS — I10 Essential (primary) hypertension: Secondary | ICD-10-CM | POA: Diagnosis not present

## 2020-09-08 DIAGNOSIS — D649 Anemia, unspecified: Secondary | ICD-10-CM | POA: Diagnosis not present

## 2020-09-08 DIAGNOSIS — R7303 Prediabetes: Secondary | ICD-10-CM | POA: Diagnosis not present

## 2020-09-14 DIAGNOSIS — Z4822 Encounter for aftercare following kidney transplant: Secondary | ICD-10-CM | POA: Diagnosis not present

## 2020-09-14 DIAGNOSIS — D649 Anemia, unspecified: Secondary | ICD-10-CM | POA: Diagnosis not present

## 2020-09-14 DIAGNOSIS — R630 Anorexia: Secondary | ICD-10-CM | POA: Diagnosis not present

## 2020-09-14 DIAGNOSIS — E785 Hyperlipidemia, unspecified: Secondary | ICD-10-CM | POA: Diagnosis not present

## 2020-09-14 DIAGNOSIS — I10 Essential (primary) hypertension: Secondary | ICD-10-CM | POA: Diagnosis not present

## 2020-09-14 DIAGNOSIS — Z9181 History of falling: Secondary | ICD-10-CM | POA: Diagnosis not present

## 2020-09-14 DIAGNOSIS — Z79899 Other long term (current) drug therapy: Secondary | ICD-10-CM | POA: Diagnosis not present

## 2020-09-14 DIAGNOSIS — Z940 Kidney transplant status: Secondary | ICD-10-CM | POA: Diagnosis not present

## 2020-09-14 DIAGNOSIS — R519 Headache, unspecified: Secondary | ICD-10-CM | POA: Diagnosis not present

## 2020-09-14 DIAGNOSIS — F0390 Unspecified dementia without behavioral disturbance: Secondary | ICD-10-CM | POA: Diagnosis not present

## 2020-09-14 DIAGNOSIS — D84821 Immunodeficiency due to drugs: Secondary | ICD-10-CM | POA: Diagnosis not present

## 2020-09-14 DIAGNOSIS — Z7409 Other reduced mobility: Secondary | ICD-10-CM | POA: Diagnosis not present

## 2020-09-14 DIAGNOSIS — Z87891 Personal history of nicotine dependence: Secondary | ICD-10-CM | POA: Diagnosis not present

## 2020-09-14 DIAGNOSIS — E876 Hypokalemia: Secondary | ICD-10-CM | POA: Diagnosis not present

## 2020-09-14 DIAGNOSIS — R7303 Prediabetes: Secondary | ICD-10-CM | POA: Diagnosis not present

## 2020-09-20 DIAGNOSIS — R413 Other amnesia: Secondary | ICD-10-CM | POA: Diagnosis not present

## 2020-09-21 DIAGNOSIS — D84821 Immunodeficiency due to drugs: Secondary | ICD-10-CM | POA: Diagnosis not present

## 2020-09-21 DIAGNOSIS — D849 Immunodeficiency, unspecified: Secondary | ICD-10-CM | POA: Diagnosis not present

## 2020-09-21 DIAGNOSIS — E785 Hyperlipidemia, unspecified: Secondary | ICD-10-CM | POA: Diagnosis not present

## 2020-09-21 DIAGNOSIS — I10 Essential (primary) hypertension: Secondary | ICD-10-CM | POA: Diagnosis not present

## 2020-09-21 DIAGNOSIS — E119 Type 2 diabetes mellitus without complications: Secondary | ICD-10-CM | POA: Diagnosis not present

## 2020-09-21 DIAGNOSIS — D649 Anemia, unspecified: Secondary | ICD-10-CM | POA: Diagnosis not present

## 2020-09-21 DIAGNOSIS — Z4822 Encounter for aftercare following kidney transplant: Secondary | ICD-10-CM | POA: Diagnosis not present

## 2020-09-21 DIAGNOSIS — Z1331 Encounter for screening for depression: Secondary | ICD-10-CM | POA: Diagnosis not present

## 2020-09-21 DIAGNOSIS — Z940 Kidney transplant status: Secondary | ICD-10-CM | POA: Diagnosis not present

## 2020-09-21 DIAGNOSIS — R296 Repeated falls: Secondary | ICD-10-CM | POA: Diagnosis not present

## 2020-09-21 DIAGNOSIS — I120 Hypertensive chronic kidney disease with stage 5 chronic kidney disease or end stage renal disease: Secondary | ICD-10-CM | POA: Diagnosis not present

## 2020-09-21 DIAGNOSIS — R630 Anorexia: Secondary | ICD-10-CM | POA: Diagnosis not present

## 2020-09-28 DIAGNOSIS — Z4822 Encounter for aftercare following kidney transplant: Secondary | ICD-10-CM | POA: Diagnosis not present

## 2020-09-28 DIAGNOSIS — Z792 Long term (current) use of antibiotics: Secondary | ICD-10-CM | POA: Diagnosis not present

## 2020-09-28 DIAGNOSIS — R972 Elevated prostate specific antigen [PSA]: Secondary | ICD-10-CM | POA: Diagnosis not present

## 2020-09-28 DIAGNOSIS — I10 Essential (primary) hypertension: Secondary | ICD-10-CM | POA: Diagnosis not present

## 2020-09-28 DIAGNOSIS — D849 Immunodeficiency, unspecified: Secondary | ICD-10-CM | POA: Diagnosis not present

## 2020-09-28 DIAGNOSIS — Z7952 Long term (current) use of systemic steroids: Secondary | ICD-10-CM | POA: Diagnosis not present

## 2020-09-28 DIAGNOSIS — F0390 Unspecified dementia without behavioral disturbance: Secondary | ICD-10-CM | POA: Diagnosis not present

## 2020-09-28 DIAGNOSIS — Z789 Other specified health status: Secondary | ICD-10-CM | POA: Diagnosis not present

## 2020-09-28 DIAGNOSIS — E119 Type 2 diabetes mellitus without complications: Secondary | ICD-10-CM | POA: Diagnosis not present

## 2020-09-28 DIAGNOSIS — Z77098 Contact with and (suspected) exposure to other hazardous, chiefly nonmedicinal, chemicals: Secondary | ICD-10-CM | POA: Diagnosis not present

## 2020-09-28 DIAGNOSIS — F4310 Post-traumatic stress disorder, unspecified: Secondary | ICD-10-CM | POA: Diagnosis not present

## 2020-09-28 DIAGNOSIS — F32A Depression, unspecified: Secondary | ICD-10-CM | POA: Diagnosis not present

## 2020-09-28 DIAGNOSIS — Z7409 Other reduced mobility: Secondary | ICD-10-CM | POA: Diagnosis not present

## 2020-09-28 DIAGNOSIS — R296 Repeated falls: Secondary | ICD-10-CM | POA: Diagnosis not present

## 2020-09-28 DIAGNOSIS — Z940 Kidney transplant status: Secondary | ICD-10-CM | POA: Diagnosis not present

## 2020-09-28 DIAGNOSIS — E785 Hyperlipidemia, unspecified: Secondary | ICD-10-CM | POA: Diagnosis not present

## 2020-09-29 ENCOUNTER — Telehealth: Payer: Self-pay | Admitting: Cardiovascular Disease

## 2020-09-29 NOTE — Telephone Encounter (Signed)
Spoke with Jeremy Black and made him aware to contact PCP to let them know about the fall.  He states they have reached out to them as well.

## 2020-09-29 NOTE — Telephone Encounter (Signed)
Jeremy Black with [facility] is calling to report the pt fell up the steps but he is not injured

## 2020-10-02 ENCOUNTER — Telehealth: Payer: Self-pay | Admitting: Internal Medicine

## 2020-10-02 NOTE — Telephone Encounter (Signed)
Left message for patient to call back and schedule Medicare Annual Wellness Visit (AWV) either virtually or in office.   Last AWV 01/05/2019  please schedule at anytime with [facility] Nurse Health Advisor 1 or 2   This should be a 45 minute visit.

## 2020-10-05 DIAGNOSIS — Z79899 Other long term (current) drug therapy: Secondary | ICD-10-CM | POA: Diagnosis not present

## 2020-10-05 DIAGNOSIS — F0390 Unspecified dementia without behavioral disturbance: Secondary | ICD-10-CM | POA: Diagnosis not present

## 2020-10-05 DIAGNOSIS — Z5181 Encounter for therapeutic drug level monitoring: Secondary | ICD-10-CM | POA: Diagnosis not present

## 2020-10-05 DIAGNOSIS — E876 Hypokalemia: Secondary | ICD-10-CM | POA: Diagnosis not present

## 2020-10-05 DIAGNOSIS — F32A Depression, unspecified: Secondary | ICD-10-CM | POA: Diagnosis not present

## 2020-10-05 DIAGNOSIS — E785 Hyperlipidemia, unspecified: Secondary | ICD-10-CM | POA: Diagnosis not present

## 2020-10-05 DIAGNOSIS — R630 Anorexia: Secondary | ICD-10-CM | POA: Diagnosis not present

## 2020-10-05 DIAGNOSIS — I151 Hypertension secondary to other renal disorders: Secondary | ICD-10-CM | POA: Diagnosis not present

## 2020-10-05 DIAGNOSIS — D849 Immunodeficiency, unspecified: Secondary | ICD-10-CM | POA: Diagnosis not present

## 2020-10-05 DIAGNOSIS — Z940 Kidney transplant status: Secondary | ICD-10-CM | POA: Diagnosis not present

## 2020-10-05 DIAGNOSIS — R296 Repeated falls: Secondary | ICD-10-CM | POA: Diagnosis not present

## 2020-10-05 DIAGNOSIS — I10 Essential (primary) hypertension: Secondary | ICD-10-CM | POA: Diagnosis not present

## 2020-10-05 DIAGNOSIS — Z4822 Encounter for aftercare following kidney transplant: Secondary | ICD-10-CM | POA: Diagnosis not present

## 2020-10-05 DIAGNOSIS — D649 Anemia, unspecified: Secondary | ICD-10-CM | POA: Diagnosis not present

## 2020-10-05 DIAGNOSIS — R519 Headache, unspecified: Secondary | ICD-10-CM | POA: Diagnosis not present

## 2020-10-05 DIAGNOSIS — R413 Other amnesia: Secondary | ICD-10-CM | POA: Diagnosis not present

## 2020-10-12 DIAGNOSIS — D649 Anemia, unspecified: Secondary | ICD-10-CM | POA: Diagnosis not present

## 2020-10-12 DIAGNOSIS — F0390 Unspecified dementia without behavioral disturbance: Secondary | ICD-10-CM | POA: Diagnosis not present

## 2020-10-12 DIAGNOSIS — Z4822 Encounter for aftercare following kidney transplant: Secondary | ICD-10-CM | POA: Diagnosis not present

## 2020-10-12 DIAGNOSIS — D849 Immunodeficiency, unspecified: Secondary | ICD-10-CM | POA: Diagnosis not present

## 2020-10-12 DIAGNOSIS — N186 End stage renal disease: Secondary | ICD-10-CM | POA: Diagnosis not present

## 2020-10-12 DIAGNOSIS — E876 Hypokalemia: Secondary | ICD-10-CM | POA: Diagnosis not present

## 2020-10-12 DIAGNOSIS — Z7409 Other reduced mobility: Secondary | ICD-10-CM | POA: Diagnosis not present

## 2020-10-12 DIAGNOSIS — E1122 Type 2 diabetes mellitus with diabetic chronic kidney disease: Secondary | ICD-10-CM | POA: Diagnosis not present

## 2020-10-12 DIAGNOSIS — R296 Repeated falls: Secondary | ICD-10-CM | POA: Diagnosis not present

## 2020-10-12 DIAGNOSIS — Z789 Other specified health status: Secondary | ICD-10-CM | POA: Diagnosis not present

## 2020-10-12 DIAGNOSIS — Z79899 Other long term (current) drug therapy: Secondary | ICD-10-CM | POA: Diagnosis not present

## 2020-10-12 DIAGNOSIS — Z940 Kidney transplant status: Secondary | ICD-10-CM | POA: Diagnosis not present

## 2020-10-12 DIAGNOSIS — E785 Hyperlipidemia, unspecified: Secondary | ICD-10-CM | POA: Diagnosis not present

## 2020-10-12 DIAGNOSIS — I120 Hypertensive chronic kidney disease with stage 5 chronic kidney disease or end stage renal disease: Secondary | ICD-10-CM | POA: Diagnosis not present

## 2020-10-12 DIAGNOSIS — I10 Essential (primary) hypertension: Secondary | ICD-10-CM | POA: Diagnosis not present

## 2020-10-24 ENCOUNTER — Telehealth: Payer: No Typology Code available for payment source | Admitting: Internal Medicine

## 2020-10-26 ENCOUNTER — Telehealth: Payer: Self-pay | Admitting: Internal Medicine

## 2020-10-26 DIAGNOSIS — Z789 Other specified health status: Secondary | ICD-10-CM | POA: Diagnosis not present

## 2020-10-26 DIAGNOSIS — F4310 Post-traumatic stress disorder, unspecified: Secondary | ICD-10-CM | POA: Diagnosis not present

## 2020-10-26 DIAGNOSIS — R413 Other amnesia: Secondary | ICD-10-CM | POA: Diagnosis not present

## 2020-10-26 DIAGNOSIS — Z4822 Encounter for aftercare following kidney transplant: Secondary | ICD-10-CM | POA: Diagnosis not present

## 2020-10-26 DIAGNOSIS — I10 Essential (primary) hypertension: Secondary | ICD-10-CM | POA: Diagnosis not present

## 2020-10-26 DIAGNOSIS — Z7409 Other reduced mobility: Secondary | ICD-10-CM | POA: Diagnosis not present

## 2020-10-26 DIAGNOSIS — Z7952 Long term (current) use of systemic steroids: Secondary | ICD-10-CM | POA: Diagnosis not present

## 2020-10-26 DIAGNOSIS — Z79899 Other long term (current) drug therapy: Secondary | ICD-10-CM | POA: Diagnosis not present

## 2020-10-26 DIAGNOSIS — N186 End stage renal disease: Secondary | ICD-10-CM | POA: Diagnosis not present

## 2020-10-26 DIAGNOSIS — E119 Type 2 diabetes mellitus without complications: Secondary | ICD-10-CM | POA: Diagnosis not present

## 2020-10-26 DIAGNOSIS — Z792 Long term (current) use of antibiotics: Secondary | ICD-10-CM | POA: Diagnosis not present

## 2020-10-26 DIAGNOSIS — D849 Immunodeficiency, unspecified: Secondary | ICD-10-CM | POA: Diagnosis not present

## 2020-10-26 DIAGNOSIS — Z940 Kidney transplant status: Secondary | ICD-10-CM | POA: Diagnosis not present

## 2020-10-26 DIAGNOSIS — I120 Hypertensive chronic kidney disease with stage 5 chronic kidney disease or end stage renal disease: Secondary | ICD-10-CM | POA: Diagnosis not present

## 2020-10-26 DIAGNOSIS — R296 Repeated falls: Secondary | ICD-10-CM | POA: Diagnosis not present

## 2020-10-26 DIAGNOSIS — D649 Anemia, unspecified: Secondary | ICD-10-CM | POA: Diagnosis not present

## 2020-10-26 DIAGNOSIS — Z77098 Contact with and (suspected) exposure to other hazardous, chiefly nonmedicinal, chemicals: Secondary | ICD-10-CM | POA: Diagnosis not present

## 2020-10-26 DIAGNOSIS — F1027 Alcohol dependence with alcohol-induced persisting dementia: Secondary | ICD-10-CM | POA: Diagnosis not present

## 2020-10-26 NOTE — Telephone Encounter (Signed)
Pt is calling in stating that [facility] [employer] [facility] will be faxing over a form for the provider to fill out due to the pt does not eat pork.  Spouse has a DNR form to be completed by the provider and she is aware that she needs to make an appointment (a appointment has been made for a in person appt).

## 2020-11-09 DIAGNOSIS — E872 Acidosis: Secondary | ICD-10-CM | POA: Diagnosis not present

## 2020-11-09 DIAGNOSIS — I10 Essential (primary) hypertension: Secondary | ICD-10-CM | POA: Diagnosis not present

## 2020-11-09 DIAGNOSIS — F32A Depression, unspecified: Secondary | ICD-10-CM | POA: Diagnosis not present

## 2020-11-09 DIAGNOSIS — E876 Hypokalemia: Secondary | ICD-10-CM | POA: Diagnosis not present

## 2020-11-09 DIAGNOSIS — E119 Type 2 diabetes mellitus without complications: Secondary | ICD-10-CM | POA: Diagnosis not present

## 2020-11-09 DIAGNOSIS — D649 Anemia, unspecified: Secondary | ICD-10-CM | POA: Diagnosis not present

## 2020-11-09 DIAGNOSIS — F0390 Unspecified dementia without behavioral disturbance: Secondary | ICD-10-CM | POA: Diagnosis not present

## 2020-11-09 DIAGNOSIS — E785 Hyperlipidemia, unspecified: Secondary | ICD-10-CM | POA: Diagnosis not present

## 2020-11-09 DIAGNOSIS — Z4822 Encounter for aftercare following kidney transplant: Secondary | ICD-10-CM | POA: Diagnosis not present

## 2020-11-09 DIAGNOSIS — Z940 Kidney transplant status: Secondary | ICD-10-CM | POA: Diagnosis not present

## 2020-11-09 DIAGNOSIS — D849 Immunodeficiency, unspecified: Secondary | ICD-10-CM | POA: Diagnosis not present

## 2020-11-09 DIAGNOSIS — Z131 Encounter for screening for diabetes mellitus: Secondary | ICD-10-CM | POA: Diagnosis not present

## 2020-11-13 ENCOUNTER — Other Ambulatory Visit: Payer: Self-pay

## 2020-11-13 NOTE — Progress Notes (Signed)
Chief Complaint  Patient presents with   Form completion     HPI: Jeremy Black 75 y.o. come in f with wife who is now primary caretaker to discuss DNR and a form for dietary limitation not eating pork. Had  renal transplant  deceased donor  transplant  since 4 17 22   H as care from the [place] had  visit [facility] 7 28  transplant  clinic  He is having more memory problems is on Aricept and is to see psychiatrist today.  He has begun an adult daycare 3 days a week at [facility].  Supported by the [place].  This seems to be a positive helpful interaction.  He is also getting OT and PT trying to regain his strength at risk of falling uses a walker and sometimes a wheelchair.  Date originally discussed a DNR form but to discuss this again today. Care is through specialists at [facility]. ROS: See pertinent positives and negatives per HPI.  Past Medical History:  Diagnosis Date   Allergy    Arthritis    Cataract    Diabetes mellitus without complication (HCC)    ED (erectile dysfunction)    GERD (gastroesophageal reflux disease)    Hx of adenomatous colonic polyps 07/15/2017   Hyperlipidemia    Hypertension    Normal nuclear stress test    myoview neg 12 09 except for HT   Other and unspecified alcohol dependence, unspecified drinking behavior    date of last use 02/13/11   Prostate cancer ([place]) 02/2017   stage t1c, low risk and asymptomatic followed at [facility]   Proteinuria    past record showed 532m protein excretion per 24 hours eval by renal   PTSD (post-[place] stress disorder)    [place]  [facility]    Family History  Problem Relation Age of Onset   Diabetes Mother    Hypertension Mother    Other Mother        nerves   Throat cancer Father        deceased   Coronary artery disease Sister    Diabetes Sister    Heart disease Sister    Hypertension Brother    Alcohol abuse Paternal Aunt     Alcohol abuse Cousin    Drug abuse Son        cocaine dependent    Social History   Socioeconomic History   Marital status: Legally Separated    Spouse name: Not on file   Number of children: Not on file   Years of education: Not on file   Highest education level: Not on file  Occupational History   Occupation: CAP worker with special needs adults  Tobacco Use   Smoking status: Former    Packs/day: 0.50    Years: 40.00    Pack years: 20.00    Types: Cigarettes    Quit date: 04/15/2009    Years since quitting: 11.5   Smokeless tobacco: Never  Vaping Use   Vaping Use: Never used  Substance and Sexual Activity   Alcohol use: Yes    Alcohol/week: 50.0 standard drinks    Types: 50 Shots of liquor per week    Comment: Pt in treatment for alcohol dependence. 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   Working with disabled adults.    Playedgolf  Hunting in past          Social Determinants of Health   Financial Resource Strain: Not on file  Food Insecurity: Not on file  Transportation Needs: Not on file  Physical Activity: Not on file  Stress: Not on file  Social Connections: Not on file    Outpatient Medications Prior to Visit  Medication Sig Dispense Refill   amLODipine (NORVASC) 5 MG tablet Take 1 tablet by mouth daily.     donepezil (ARICEPT) 5 MG tablet TAKE ONE TABLET BY MOUTH AS DIRECTED BY YOUR MEDICAL PROVIDER -  TAKE ONE TABLET BY MOUTH AT BEDTIME FOR 14 DAYS THEN TAKE TWO TABLETS BY  MOUTH AT BEDTIME FOR 14 DAYS THEN TAKE THREE TABLETS BY MOUTH AT BEDTIME   FOR 14 DAYS THEN TAKE FOUR TABLETS BY MOUTH AT BEDTIME -   TAKE ONE TABLET BY MOUTH AT BEDTIME FOR 14 DAYS THEN TAKE TWO TABLETS BY  MOUTH AT BEDTIME FOR 14 DAYS THEN TAKE THREE TABLETS BY MOUTH AT BEDTIME  FOR 14 DAYS THEN TAKE FOUR TABLETS BY MOUTH AT BEDTIME     ipratropium (ATROVENT) 0.03 % nasal spray Place 2 sprays into both nostrils every 12 (twelve) hours.     Megestrol Acetate (MEGACE ORAL PO) Take by mouth.     memantine (NAMENDA) 10 MG tablet Take by mouth. Take 1/2 tab x 2 weeks, then 1 tab x 2 weeks, then 1.5 tab x 2 weeks then 2 tab daily to continue.     mirtazapine (REMERON SOL-TAB) 15 MG disintegrating tablet Take 15 mg by mouth at bedtime.     omeprazole (PRILOSEC) 20 MG capsule Take 1 capsule by mouth daily.     potassium chloride (KLOR-CON) 10 MEQ tablet Take 10 mEq by mouth 2 (two) times daily.     tamsulosin (FLOMAX) 0.4 MG CAPS capsule Take 0.4 mg by mouth.     telmisartan (MICARDIS) 40 MG tablet TAKE 8 TABLETS BY MOUTH ONCE EVERY DAY     traZODone (DESYREL) 50 MG tablet Take 25 mg by mouth at bedtime as needed for sleep.      atorvastatin (LIPITOR) 20 MG tablet TAKE ONE TABLET BY MOUTH AT BEDTIME FOR CHOLESTEROL     hydrALAZINE (APRESOLINE) 50 MG tablet Take 50 mg by mouth 3 (three) times daily.      hydroxypropyl methylcellulose / hypromellose (ISOPTO TEARS / GONIOVISC) 2.5 % ophthalmic solution Place 1 drop into both eyes 2 (two) times daily.     Facility-Administered Medications Prior to Visit  Medication Dose Route Frequency Provider Last Rate Last Admin   0.9 %  sodium chloride infusion  500 mL Intravenous Once Gatha Mayer, MD         EXAM:  BP 138/68 (BP Location: Right Arm, Patient Position: Sitting, Cuff Size: Normal)   Pulse 98   Temp 98.3 F (36.8 C) (Oral)   Ht 5' 11"  (1.803 m)   Wt 175 lb 9.6 oz (79.7 kg)   SpO2  96%   BMI 24.49 kg/m   Body mass index is 24.49 kg/m.  GENERAL: vitals reviewed and listed above, alert, interactive appears well  hydrated and in no acute distress pleasant, somewhat monotone wife answers questions.  Has obvious memory difficulties.  Overall decreased muscle mass independent walking slightly unsteady HEENT: atraumatic, conjunctiva  clear, no obvious abnormalities on inspection of external nose and ears  NECK: no obvious masses on inspection palpation  LUNGS: clear to auscultation bilaterally, no wheezes, rales or rhonchi, good air movement CV: HRRR, no clubbing cyanosis or  peripheral edema nl cap refill  MS: moves all extremities without noticeable focal  abnormality PSYCH: pleasant and cooperative,   BP Readings from Last 3 Encounters:  11/14/20 138/68  07/25/20 (!) 90/58  01/27/19 (!) 150/70    ASSESSMENT AND PLAN:  Discussed the following assessment and plan:  S/p cadaver renal transplant  Dementia without behavioral disturbance, unspecified dementia type ([place])  Unsteady gait  Memory deficit Form completed and signed so he is not given port for religious reasons at the adult daycare. Discussed with wife other supportive help as she is the primary caretaker for everything else.  Including personal care. Discussed DNR and patient wants to be resuscitated at this time outside of hospital. Discussed patient is to be discussed of things such as feeding tubes prolonged ventilation etc. He does not remember his login and password to [facility] discussed with he and his wife to get this updated and would be a helpful more efficient mode of communication for some things. Willing to order a form for more help with home care as indicated. She will reach out to [place] as indicated. Record review counsel visit plan 32 minutes -Patient advised to return or notify health care team  if  new concerns arise.  Patient Instructions  Good to see you today .  I agree getting on my chart  be helpful  for future .  Future forms  .    Standley Brooking. Sherida Dobkins M.D. See below latest visit at [place] [facility]  is seen in followup of kidney transplant. 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 PRA 0%. HLA MM 2-2-2. Worsening dementia, poor appetite. Ongoing weight loss, progressive weakness.  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 PRA 0%. HLA MM 2-2-2. Renal function stable. Will check CMP today. Creatinine (MG/DL)  Date Value  10/26/2020 1.29  10/12/2020 1.33 (H)  Will check BK pcr monthly. Will check UPC monthly. Will check UA and Urine Cx today 10/05/2020  Immunosuppression: Pt status post Simulect induction. Continue current regimen of tacrolimus, Imuran 150 mg daily (reduced due to age/frailty/GI issues/poor appetite), and prednisone 5 mg daily. Pt will remain on steroids (prednisone) long term. Goal FK level: 4-5 given advanced age and frailty. May need lower imuran dose in the future per txp.   Fall at home/HA: Pt with multiple falls since transplant but no recent falls. Per PT/OT assessment will have home therapy. [facility] working on this. Wife also reports she needs help and will try to get a CNA as well as a lift for patient - she will meet with PCP to discuss. No lift yet and pt getting harder to get up the stairs. Wife notes she cannot get him upstairs after he takes meds so she must give meds after he is upstairs. Concern for weight loss/muscle loss contributing to  falls therefore patient was transitioned to Imuran.  Volume status: Weight appears to be stabilizing. On Megace for appetite enhancement. Encouraged pt and wife to make sure pt is getting adequate PO intake. Pt also started on mirtazapine 15 mg daily. Wife states pt eating small amounts throughout the day and appetite is improving.  Wt Readings from Last 3 Encounters:  11/09/20 79.8 kg (175 lb 14.8 oz)  10/26/20 79.2 kg (174 lb 9.7 oz)  10/12/20 81.4 kg (179 lb 7.3 oz)   Post op Issues: Wound well  healed. Stent removed 09/05/20.  One month protocol biopsy deferred given pt age and frailty.   ID prophylaxis: Okay to d/c Valcyte today as patient is 3 months post-transplant. CMV IgG + 07/30/2020.  Bactrim long term for PJP prophylaxis.  HTN: Stable, will monitor. Continue current regimen. Avoiding lower BPs to avoid fall risk  Anemia: assessed as very mild on labs today. Received 1 units PRBCs post op. Last CBC: Lab Results  Component Value Date  WBC 5.7 10/26/2020  HGB 12.3 (L) 10/26/2020  HCT 38.3 (L) 10/26/2020  MCV 84.7 10/26/2020  PLT 310 10/26/2020   Hyperlipidemia: Continue statin. Lab Results  Component Value Date  LDL 98 09/04/2020   Electrolytes: check lytes today. Hypokalemia: cont KCl 20 mEq BID. Discussed with Pharmd and KCl is a ghost tablet so medication will get absorbed even if tablet is visible in stool. Acidosis: resolved and K low, will hold sodium bicarb today.  Lab Results  Component Value Date  NA 141 10/26/2020  K 3.2 (L) 10/26/2020  CL 108 10/26/2020  CO2 22 10/26/2020   Magnesium  Date Value Ref Range Status  10/26/2020 1.4 (L) 1.9 - 2.7 MG/DL Final  Comment:  Patients taking eltrombopag at doses >/= 100 mg daily may show falsely elevated values of 10% or greater.   Lab Results  Component Value Date  CALCIUM 9.1 10/26/2020  PHOS 2.8 10/26/2020   DM screening: Patient reports history of pre-diabetes pretransplant and diabetes mentioned on problem list in 2019. A1c 4.9-5.4% since 2020. Will monitor. Lab Results  Component Value Date  HBA1C 5.3 10/26/2020   Dementia: Patient follows with neurology. Will continue 10 mg of Aricept (per wife that was pretransplant dose) and Namenda. They will contact Dr. Theda Sers to set up follow up. Patient is dependent on his wife to assist with ADLs, she is an outstanding caregiver but overall patient is at high risk for complications. Megace changed from PO to suspension, which was his dose prior to transplant. Pt  was started on mirtazipine 15 mg at night to help with appetite. Will see neurologist at [facility].  Depression: patient scored 19 on depression screening recently. He was referred to [facility]ry at the [facility] by his neurologist. Pt also discussed with Yvone Neu, MSW, who will reach out to pt and wife to discuss. Discussed signs and symptoms for which to seek medical evaluation sooner. Following with PCP.   ? Syncopal episode with fall in hospital: EKG 07/31/20 right bundle branch block. Echocardiogram 07/11/20 showed LVH and EF 60-65%, RV with normal size and function and no significant valvular abnormalities. Plan for outpatient follow up with cardiology.  Follow up: RTC 2 weeks if labs stable today.  Depending on patient preference, staffing availability and clinic volume on scheduled clinic day, the patient may be changed to an RCE (return care extender), Nurse Visit, or Lab only visit if deemed clinically stable.  Electronically signed by: Rickey Barbara, PA-C 11/09/2020 8:57 AM

## 2020-11-14 ENCOUNTER — Ambulatory Visit: Payer: Medicare PPO | Admitting: Internal Medicine

## 2020-11-14 ENCOUNTER — Encounter: Payer: Self-pay | Admitting: Internal Medicine

## 2020-11-14 VITALS — BP 138/68 | HR 98 | Temp 98.3°F | Ht 71.0 in | Wt 175.6 lb

## 2020-11-14 DIAGNOSIS — R419 Unspecified symptoms and signs involving cognitive functions and awareness: Secondary | ICD-10-CM | POA: Diagnosis not present

## 2020-11-14 DIAGNOSIS — R413 Other amnesia: Secondary | ICD-10-CM | POA: Diagnosis not present

## 2020-11-14 DIAGNOSIS — F32A Depression, unspecified: Secondary | ICD-10-CM | POA: Diagnosis not present

## 2020-11-14 DIAGNOSIS — R2681 Unsteadiness on feet: Secondary | ICD-10-CM

## 2020-11-14 DIAGNOSIS — F1011 Alcohol abuse, in remission: Secondary | ICD-10-CM | POA: Diagnosis not present

## 2020-11-14 DIAGNOSIS — Z940 Kidney transplant status: Secondary | ICD-10-CM | POA: Diagnosis not present

## 2020-11-14 DIAGNOSIS — F4310 Post-traumatic stress disorder, unspecified: Secondary | ICD-10-CM | POA: Diagnosis not present

## 2020-11-14 DIAGNOSIS — Z79899 Other long term (current) drug therapy: Secondary | ICD-10-CM | POA: Diagnosis not present

## 2020-11-14 DIAGNOSIS — F0390 Unspecified dementia without behavioral disturbance: Secondary | ICD-10-CM | POA: Diagnosis not present

## 2020-11-14 NOTE — Patient Instructions (Signed)
Good to see you today .  I agree getting on my chart  be helpful  for future .  Future forms  .

## 2020-11-14 NOTE — Telephone Encounter (Signed)
Had office visit today.

## 2020-11-21 ENCOUNTER — Telehealth: Payer: Self-pay | Admitting: Internal Medicine

## 2020-11-21 DIAGNOSIS — F0390 Unspecified dementia without behavioral disturbance: Secondary | ICD-10-CM

## 2020-11-21 DIAGNOSIS — Z7409 Other reduced mobility: Secondary | ICD-10-CM

## 2020-11-21 NOTE — Telephone Encounter (Signed)
PT spouse calling to followup on the latest apt where they had talked to Dr.Panosh about Home Health. They were calling to provide the following info:  Contact [employer] at [phone] to provide PA info and to request [place] through this number.

## 2020-11-22 DIAGNOSIS — R413 Other amnesia: Secondary | ICD-10-CM | POA: Diagnosis not present

## 2020-11-23 DIAGNOSIS — E876 Hypokalemia: Secondary | ICD-10-CM | POA: Diagnosis not present

## 2020-11-23 DIAGNOSIS — D649 Anemia, unspecified: Secondary | ICD-10-CM | POA: Diagnosis not present

## 2020-11-23 DIAGNOSIS — Z4822 Encounter for aftercare following kidney transplant: Secondary | ICD-10-CM | POA: Diagnosis not present

## 2020-11-23 DIAGNOSIS — E872 Acidosis: Secondary | ICD-10-CM | POA: Diagnosis not present

## 2020-11-23 DIAGNOSIS — I10 Essential (primary) hypertension: Secondary | ICD-10-CM | POA: Diagnosis not present

## 2020-11-23 DIAGNOSIS — F1027 Alcohol dependence with alcohol-induced persisting dementia: Secondary | ICD-10-CM | POA: Diagnosis not present

## 2020-11-23 DIAGNOSIS — F0390 Unspecified dementia without behavioral disturbance: Secondary | ICD-10-CM | POA: Diagnosis not present

## 2020-11-23 DIAGNOSIS — E785 Hyperlipidemia, unspecified: Secondary | ICD-10-CM | POA: Diagnosis not present

## 2020-11-23 DIAGNOSIS — I119 Hypertensive heart disease without heart failure: Secondary | ICD-10-CM | POA: Diagnosis not present

## 2020-11-23 DIAGNOSIS — Z940 Kidney transplant status: Secondary | ICD-10-CM | POA: Diagnosis not present

## 2020-11-23 DIAGNOSIS — D849 Immunodeficiency, unspecified: Secondary | ICD-10-CM | POA: Diagnosis not present

## 2020-11-23 DIAGNOSIS — I4510 Unspecified right bundle-branch block: Secondary | ICD-10-CM | POA: Diagnosis not present

## 2020-11-23 DIAGNOSIS — F32A Depression, unspecified: Secondary | ICD-10-CM | POA: Diagnosis not present

## 2020-11-24 NOTE — Telephone Encounter (Signed)
I spoke with Jeremy Black with [facility] and he stated in that in order for PA to be initiated a CPD code will need to be provided. Please advise.

## 2020-11-24 NOTE — Telephone Encounter (Signed)
Please  do PA for  home assistant  for the days he is not in day care  to help wife ( he needs help with adls  and  around the clock attendance )  has dementia and hsi renal transplant and balance problem needed walker to  avoid falling.See previous notes

## 2020-11-28 NOTE — Telephone Encounter (Signed)
What is a cpd code ? Cpt?  He has mobility balance deficit , dementia

## 2020-11-28 NOTE — Telephone Encounter (Signed)
I spoke with Jeremy Black with [facility] and he was provided CPT code for the pt. Jeremy Black stated that a PA was not needed for home health services and the pt is 100% covered by insurance.Pt's wife notified of this and requested our office to put in referral for home health.

## 2020-11-28 NOTE — Telephone Encounter (Signed)
Do any referral necessary to get him and spouse caretaker the help.  Needed.  (We are requesting a Psychologist, sport and exercise aide not just home health.)

## 2020-11-28 NOTE — Addendum Note (Signed)
Addended by: Nilda Riggs on: 11/28/2020 01:54 PM   Modules accepted: Orders

## 2020-11-28 NOTE — Telephone Encounter (Signed)
Home health referral has been placed and pt is aware. Pt's wife informed that home health may not provide personal health aid and the pt may need to acquire an aid separately.

## 2020-12-07 DIAGNOSIS — Z5181 Encounter for therapeutic drug level monitoring: Secondary | ICD-10-CM | POA: Diagnosis not present

## 2020-12-07 DIAGNOSIS — R7303 Prediabetes: Secondary | ICD-10-CM | POA: Diagnosis not present

## 2020-12-07 DIAGNOSIS — Z79899 Other long term (current) drug therapy: Secondary | ICD-10-CM | POA: Diagnosis not present

## 2020-12-07 DIAGNOSIS — M25551 Pain in right hip: Secondary | ICD-10-CM | POA: Diagnosis not present

## 2020-12-07 DIAGNOSIS — Z792 Long term (current) use of antibiotics: Secondary | ICD-10-CM | POA: Diagnosis not present

## 2020-12-07 DIAGNOSIS — Z8546 Personal history of malignant neoplasm of prostate: Secondary | ICD-10-CM | POA: Diagnosis not present

## 2020-12-07 DIAGNOSIS — F0390 Unspecified dementia without behavioral disturbance: Secondary | ICD-10-CM | POA: Diagnosis not present

## 2020-12-07 DIAGNOSIS — Z940 Kidney transplant status: Secondary | ICD-10-CM | POA: Diagnosis not present

## 2020-12-07 DIAGNOSIS — R600 Localized edema: Secondary | ICD-10-CM | POA: Diagnosis not present

## 2020-12-07 DIAGNOSIS — Z7952 Long term (current) use of systemic steroids: Secondary | ICD-10-CM | POA: Diagnosis not present

## 2020-12-07 DIAGNOSIS — Z87891 Personal history of nicotine dependence: Secondary | ICD-10-CM | POA: Diagnosis not present

## 2020-12-07 DIAGNOSIS — Z4822 Encounter for aftercare following kidney transplant: Secondary | ICD-10-CM | POA: Diagnosis not present

## 2020-12-07 DIAGNOSIS — I10 Essential (primary) hypertension: Secondary | ICD-10-CM | POA: Diagnosis not present

## 2020-12-12 DIAGNOSIS — I10 Essential (primary) hypertension: Secondary | ICD-10-CM | POA: Diagnosis not present

## 2020-12-12 DIAGNOSIS — R600 Localized edema: Secondary | ICD-10-CM | POA: Diagnosis not present

## 2020-12-12 DIAGNOSIS — Z940 Kidney transplant status: Secondary | ICD-10-CM | POA: Diagnosis not present

## 2020-12-19 ENCOUNTER — Encounter: Payer: Self-pay | Admitting: Internal Medicine

## 2020-12-21 DIAGNOSIS — R600 Localized edema: Secondary | ICD-10-CM | POA: Diagnosis not present

## 2020-12-21 DIAGNOSIS — Z7409 Other reduced mobility: Secondary | ICD-10-CM | POA: Diagnosis not present

## 2020-12-21 DIAGNOSIS — Z79899 Other long term (current) drug therapy: Secondary | ICD-10-CM | POA: Diagnosis not present

## 2020-12-21 DIAGNOSIS — I120 Hypertensive chronic kidney disease with stage 5 chronic kidney disease or end stage renal disease: Secondary | ICD-10-CM | POA: Diagnosis not present

## 2020-12-21 DIAGNOSIS — I10 Essential (primary) hypertension: Secondary | ICD-10-CM | POA: Diagnosis not present

## 2020-12-21 DIAGNOSIS — D849 Immunodeficiency, unspecified: Secondary | ICD-10-CM | POA: Diagnosis not present

## 2020-12-21 DIAGNOSIS — Z9181 History of falling: Secondary | ICD-10-CM | POA: Diagnosis not present

## 2020-12-21 DIAGNOSIS — Z940 Kidney transplant status: Secondary | ICD-10-CM | POA: Diagnosis not present

## 2020-12-21 DIAGNOSIS — F1027 Alcohol dependence with alcohol-induced persisting dementia: Secondary | ICD-10-CM | POA: Diagnosis not present

## 2020-12-21 DIAGNOSIS — Z4822 Encounter for aftercare following kidney transplant: Secondary | ICD-10-CM | POA: Diagnosis not present

## 2020-12-21 DIAGNOSIS — N186 End stage renal disease: Secondary | ICD-10-CM | POA: Diagnosis not present

## 2020-12-21 DIAGNOSIS — Z7952 Long term (current) use of systemic steroids: Secondary | ICD-10-CM | POA: Diagnosis not present

## 2020-12-21 DIAGNOSIS — Z789 Other specified health status: Secondary | ICD-10-CM | POA: Diagnosis not present

## 2020-12-21 DIAGNOSIS — E1122 Type 2 diabetes mellitus with diabetic chronic kidney disease: Secondary | ICD-10-CM | POA: Diagnosis not present

## 2020-12-21 DIAGNOSIS — Z792 Long term (current) use of antibiotics: Secondary | ICD-10-CM | POA: Diagnosis not present

## 2021-01-02 DIAGNOSIS — F4310 Post-traumatic stress disorder, unspecified: Secondary | ICD-10-CM | POA: Diagnosis not present

## 2021-01-02 DIAGNOSIS — F32A Depression, unspecified: Secondary | ICD-10-CM | POA: Diagnosis not present

## 2021-01-02 DIAGNOSIS — F1021 Alcohol dependence, in remission: Secondary | ICD-10-CM | POA: Diagnosis not present

## 2021-01-04 DIAGNOSIS — N186 End stage renal disease: Secondary | ICD-10-CM | POA: Diagnosis not present

## 2021-01-04 DIAGNOSIS — Z940 Kidney transplant status: Secondary | ICD-10-CM | POA: Diagnosis not present

## 2021-01-18 DIAGNOSIS — R600 Localized edema: Secondary | ICD-10-CM | POA: Diagnosis not present

## 2021-01-18 DIAGNOSIS — E785 Hyperlipidemia, unspecified: Secondary | ICD-10-CM | POA: Diagnosis not present

## 2021-01-18 DIAGNOSIS — M6250 Muscle wasting and atrophy, not elsewhere classified, unspecified site: Secondary | ICD-10-CM | POA: Diagnosis not present

## 2021-01-18 DIAGNOSIS — D8989 Other specified disorders involving the immune mechanism, not elsewhere classified: Secondary | ICD-10-CM | POA: Diagnosis not present

## 2021-01-18 DIAGNOSIS — E876 Hypokalemia: Secondary | ICD-10-CM | POA: Diagnosis not present

## 2021-01-18 DIAGNOSIS — R609 Edema, unspecified: Secondary | ICD-10-CM | POA: Diagnosis not present

## 2021-01-18 DIAGNOSIS — Z940 Kidney transplant status: Secondary | ICD-10-CM | POA: Diagnosis not present

## 2021-01-18 DIAGNOSIS — I10 Essential (primary) hypertension: Secondary | ICD-10-CM | POA: Diagnosis not present

## 2021-01-18 DIAGNOSIS — R634 Abnormal weight loss: Secondary | ICD-10-CM | POA: Diagnosis not present

## 2021-01-18 DIAGNOSIS — Z4822 Encounter for aftercare following kidney transplant: Secondary | ICD-10-CM | POA: Diagnosis not present

## 2021-01-18 DIAGNOSIS — D649 Anemia, unspecified: Secondary | ICD-10-CM | POA: Diagnosis not present

## 2021-01-18 DIAGNOSIS — Z77098 Contact with and (suspected) exposure to other hazardous, chiefly nonmedicinal, chemicals: Secondary | ICD-10-CM | POA: Diagnosis not present

## 2021-01-18 DIAGNOSIS — R7303 Prediabetes: Secondary | ICD-10-CM | POA: Diagnosis not present

## 2021-01-18 DIAGNOSIS — M25551 Pain in right hip: Secondary | ICD-10-CM | POA: Diagnosis not present

## 2021-02-01 DIAGNOSIS — Z79899 Other long term (current) drug therapy: Secondary | ICD-10-CM | POA: Diagnosis not present

## 2021-02-01 DIAGNOSIS — Z940 Kidney transplant status: Secondary | ICD-10-CM | POA: Diagnosis not present

## 2021-02-01 DIAGNOSIS — R7989 Other specified abnormal findings of blood chemistry: Secondary | ICD-10-CM | POA: Diagnosis not present

## 2021-02-15 DIAGNOSIS — Z940 Kidney transplant status: Secondary | ICD-10-CM | POA: Diagnosis not present

## 2021-02-15 DIAGNOSIS — E785 Hyperlipidemia, unspecified: Secondary | ICD-10-CM | POA: Diagnosis not present

## 2021-02-15 DIAGNOSIS — R634 Abnormal weight loss: Secondary | ICD-10-CM | POA: Diagnosis not present

## 2021-02-15 DIAGNOSIS — D849 Immunodeficiency, unspecified: Secondary | ICD-10-CM | POA: Diagnosis not present

## 2021-02-15 DIAGNOSIS — D649 Anemia, unspecified: Secondary | ICD-10-CM | POA: Diagnosis not present

## 2021-02-15 DIAGNOSIS — R251 Tremor, unspecified: Secondary | ICD-10-CM | POA: Diagnosis not present

## 2021-02-15 DIAGNOSIS — R600 Localized edema: Secondary | ICD-10-CM | POA: Diagnosis not present

## 2021-02-15 DIAGNOSIS — B348 Other viral infections of unspecified site: Secondary | ICD-10-CM | POA: Diagnosis not present

## 2021-02-15 DIAGNOSIS — D84821 Immunodeficiency due to drugs: Secondary | ICD-10-CM | POA: Diagnosis not present

## 2021-02-15 DIAGNOSIS — I10 Essential (primary) hypertension: Secondary | ICD-10-CM | POA: Diagnosis not present

## 2021-02-15 DIAGNOSIS — R630 Anorexia: Secondary | ICD-10-CM | POA: Diagnosis not present

## 2021-02-15 DIAGNOSIS — Z7409 Other reduced mobility: Secondary | ICD-10-CM | POA: Diagnosis not present

## 2021-02-15 DIAGNOSIS — F0390 Unspecified dementia without behavioral disturbance: Secondary | ICD-10-CM | POA: Diagnosis not present

## 2021-02-15 DIAGNOSIS — Z4822 Encounter for aftercare following kidney transplant: Secondary | ICD-10-CM | POA: Diagnosis not present

## 2021-02-15 DIAGNOSIS — R296 Repeated falls: Secondary | ICD-10-CM | POA: Diagnosis not present

## 2021-02-15 DIAGNOSIS — R413 Other amnesia: Secondary | ICD-10-CM | POA: Diagnosis not present

## 2021-03-01 DIAGNOSIS — Z940 Kidney transplant status: Secondary | ICD-10-CM | POA: Diagnosis not present

## 2021-03-01 DIAGNOSIS — D849 Immunodeficiency, unspecified: Secondary | ICD-10-CM | POA: Diagnosis not present

## 2021-03-06 ENCOUNTER — Ambulatory Visit (INDEPENDENT_AMBULATORY_CARE_PROVIDER_SITE_OTHER): Payer: Medicare PPO | Admitting: Internal Medicine

## 2021-03-06 ENCOUNTER — Encounter: Payer: Self-pay | Admitting: Internal Medicine

## 2021-03-06 VITALS — BP 120/72 | HR 64 | Ht 71.0 in | Wt 195.0 lb

## 2021-03-06 DIAGNOSIS — Z940 Kidney transplant status: Secondary | ICD-10-CM | POA: Diagnosis not present

## 2021-03-06 DIAGNOSIS — Z86010 Personal history of colonic polyps: Secondary | ICD-10-CM

## 2021-03-06 MED ORDER — PLENVU 140 G PO SOLR: 1.0000 | ORAL | 0 refills | Status: DC

## 2021-03-06 NOTE — Progress Notes (Signed)
Jeremy Black 75 y.o. 1946-03-09 [id number]  Assessment & Plan:   Encounter Diagnoses  Name Primary?   Hx of adenomatous colonic polyps Yes   Kidney transplanted     Schedule surveillance colonoscopy.  Plenvu prep samples given to the patient status post renal transplant.The risks and benefits as well as alternatives of endoscopic procedure(s) have been discussed and reviewed. All questions answered. The patient agrees to proceed.    Subjective:   Chief Complaint: History of colon polyps due for colonoscopy  HPI 75 year old African-American man here with his wife, in the setting of a history of adenomatous colon polyps removed in March 2019.  At that time he had 3 adenomas and 2 of them were greater than or equal to 10 mm in size.  He has no active GI symptoms.  He had a normal EGD at that time.  In the interval he has had a successful renal transplant and is quite happy to be off dialysis. Allergies  Allergen Reactions   Codeine Phosphate     Pt unsure of reaction    Lisinopril Cough   Current Meds  Medication Sig   amLODipine (NORVASC) 5 MG tablet Take 1 tablet by mouth daily.   atorvastatin (LIPITOR) 20 MG tablet Take 1 tablet by mouth at bedtime as needed.   azaTHIOprine (IMURAN) 50 MG tablet Take 3 tablets by mouth daily.   carboxymethylcellulose (REFRESH PLUS) 0.5 % SOLN Place 1 drop into both eyes as needed.   carvedilol (COREG) 3.125 MG tablet Take 3.125 mg by mouth 2 (two) times daily.   donepezil (ARICEPT) 5 MG tablet TAKE ONE TABLET BY MOUTH AS DIRECTED BY YOUR MEDICAL PROVIDER -  TAKE ONE TABLET BY MOUTH AT BEDTIME FOR 14 DAYS THEN TAKE TWO TABLETS BY  MOUTH AT BEDTIME FOR 14 DAYS THEN TAKE THREE TABLETS BY MOUTH AT BEDTIME  FOR 14 DAYS THEN TAKE FOUR TABLETS BY MOUTH AT BEDTIME -   TAKE ONE TABLET BY MOUTH AT BEDTIME FOR 14 DAYS THEN TAKE TWO TABLETS BY  MOUTH AT BEDTIME FOR 14 DAYS THEN TAKE THREE TABLETS BY MOUTH AT BEDTIME  FOR 14 DAYS THEN TAKE FOUR TABLETS  BY MOUTH AT BEDTIME   furosemide (LASIX) 20 MG tablet Take by mouth as directed.   ipratropium (ATROVENT) 0.03 % nasal spray Place 2 sprays into both nostrils every 12 (twelve) hours.   Megestrol Acetate (MEGACE ORAL PO) Take by mouth.   memantine (NAMENDA) 10 MG tablet Take by mouth. Take 1/2 tab x 2 weeks, then 1 tab x 2 weeks, then 1.5 tab x 2 weeks then 2 tab daily to continue.   mirtazapine (REMERON SOL-TAB) 15 MG disintegrating tablet Take 15 mg by mouth at bedtime.   Multiple Vitamin (MULTIVITAMIN) tablet Take 1 tablet by mouth daily.   omeprazole (PRILOSEC) 20 MG capsule Take 1 capsule by mouth daily.   PEG-KCl-NaCl-NaSulf-Na Asc-C (PLENVU) 140 g SOLR Take 1 kit by mouth as directed.   potassium chloride (KLOR-CON) 10 MEQ tablet Take 10 mEq by mouth 2 (two) times daily.   predniSONE (DELTASONE) 5 MG tablet Take 1 tablet by mouth daily.   tacrolimus (PROGRAF) 1 MG capsule Take by mouth as directed.   tamsulosin (FLOMAX) 0.4 MG CAPS capsule Take 0.4 mg by mouth.   telmisartan (MICARDIS) 40 MG tablet TAKE 8 TABLETS BY MOUTH ONCE EVERY DAY   traZODone (DESYREL) 50 MG tablet Take 25 mg by mouth at bedtime as needed for sleep.    Past  Medical History:  Diagnosis Date   Allergy    Arthritis    Cataract    Diabetes mellitus without complication ([place])    ED (erectile dysfunction)    GERD (gastroesophageal reflux disease)    Hx of adenomatous colonic polyps 07/15/2017   Hyperlipidemia    Hypertension    Normal nuclear stress test    myoview neg 12 09 except for HT   Other and unspecified alcohol dependence, unspecified drinking behavior    date of last use 02/13/11   Prostate cancer ([place]) 02/2017   stage t1c, low risk and asymptomatic followed at [facility]   Proteinuria    past record showed 552m protein excretion per 24 hours eval by renal   PTSD (post-[place] stress disorder)    [place]  [place] [facility]   Past Surgical  History:  Procedure Laterality Date   ls spinal surgery     NM MYOVIEW LTD  12/09   neg except for HT   PROSTATE BIOPSY  2018   stage t1c cancer   TRIGGER FINGER RELEASE Left 10/18/2016   Procedure: LEFT TRIGGER THUMB RELEASE;  Surgeon: TMilly Jakob MD;  Location: [facility]  Service: Orthopedics;  Laterality: Left;   Social History   Social History Narrative   Former Alcohol use   Widowed   Wife died of stomach cancer    remarried 2014   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   Working with disabled adults.    Playedgolf  Hunting in past          family history includes Alcohol abuse in his cousin and paternal aunt; Coronary artery disease in his sister; Diabetes in his mother and sister; Drug abuse in his son; Heart disease in his sister; Hypertension in his brother and mother; Other in his mother; Throat cancer in his father.   Review of Systems As per HPI Is having short-term memory disturbance  Objective:   Physical Exam BP 120/72   Pulse 64   Ht 5' 11"  (1.803 m)   Wt 195 lb (88.5 kg)   BMI 27.20 kg/m  Lungs clear Heart sounds normal S1-S2 no rubs or gallops The abdomen is soft and nontender Appropriate mood and affect answers questions appropriately

## 2021-03-06 NOTE — Patient Instructions (Signed)
You have been scheduled for a colonoscopy. Please follow written instructions given to you at your visit today.  Please use the prep kit you have been given today. If you use inhalers (even only as needed), please bring them with you on the day of your procedure.  I appreciate the opportunity to care for you. Silvano Rusk, MD, [facility]
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
## 2021-03-13 ENCOUNTER — Encounter: Payer: Self-pay | Admitting: Internal Medicine

## 2021-03-13 ENCOUNTER — Ambulatory Visit: Payer: Medicare PPO | Admitting: Internal Medicine

## 2021-03-13 ENCOUNTER — Ambulatory Visit (INDEPENDENT_AMBULATORY_CARE_PROVIDER_SITE_OTHER): Payer: Medicare PPO

## 2021-03-13 ENCOUNTER — Other Ambulatory Visit: Payer: Self-pay

## 2021-03-13 VITALS — BP 176/90 | HR 69 | Temp 97.8°F | Ht 71.0 in | Wt 195.2 lb

## 2021-03-13 DIAGNOSIS — S161XXA Strain of muscle, fascia and tendon at neck level, initial encounter: Secondary | ICD-10-CM

## 2021-03-13 DIAGNOSIS — I10 Essential (primary) hypertension: Secondary | ICD-10-CM | POA: Diagnosis not present

## 2021-03-13 DIAGNOSIS — Z940 Kidney transplant status: Secondary | ICD-10-CM | POA: Diagnosis not present

## 2021-03-13 DIAGNOSIS — M542 Cervicalgia: Secondary | ICD-10-CM | POA: Diagnosis not present

## 2021-03-13 NOTE — Patient Instructions (Addendum)
Exam is reassuring     cervical neck strain should heal in 4-6 weeks . X ray today .   Neck cautions   tylenol if needed.   Fu in 3  -4 weeks , if not better consider PT  .  Other .  Fu about the BP readings .    Cervical Sprain A cervical sprain is a stretch or tear in one or more of the ligaments in the neck. Ligaments are the tissues that connect bones. Cervical sprains can range from mild to severe. Severe cervical sprains can cause the spinal bones (vertebrae) in the neck to be unstable. This can result in spinal cord damage and in serious nervous system problems. The time that it takes for a cervical sprain to heal depends on the cause and extent of the injury. Most cervical sprains heal in 4-6 weeks. What are the causes? Cervical sprains may be caused by trauma, such as an injury from a motor vehicle accident, a fall, or a sudden forward and backward whipping movement of the head and neck (whiplash injury). Mild cervical sprains may be caused by wear and tear over time. What increases the risk? The following factors may make you more likely to develop this condition: Participating in activities that have a high risk of trauma to the neck. These include contact sports, auto racing, gymnastics, and diving. Taking risks when driving or riding in a motor vehicle. Osteoarthritis of the spine. Poor strength and flexibility of the neck. A previous neck injury. Poor posture. Spending long periods in certain positions that put stress on the neck, such as sitting at a computer for a long time. What are the signs or symptoms? Symptoms of this condition include: Pain, soreness, stiffness, tenderness, swelling, or a burning sensation in the front, back, or sides of the neck, shoulders, or upper back. Sudden tightening of neck muscles (spasms). Limited ability to move the neck. Headache. Dizziness. Nausea or vomiting. Weakness, numbness, or tingling in a hand or an arm. Symptoms may develop  right away after injury, or they may develop over a few days. In some cases, symptoms may go away with treatment and return (recur) over time. How is this diagnosed? This condition may be diagnosed based on: Your medical history. Your symptoms. Any recent injuries or known neck problems that you have, such as arthritis in the neck. A physical exam. Imaging tests, such as X-rays, MRI, and CT scan. How is this treated? This condition is treated by resting and icing the injured area and doing physical therapy exercises. Heat therapy may be used 2-3 days after the injury occurred if there is no swelling. Depending on the severity of your condition, treatment may also include: Keeping your neck in place (immobilized) for periods of time. This may be done using: A cervical collar. This supports your chin and the back of your head. A cervical traction device. This is a sling that holds up your head. The device removes weight and pressure from your neck, and it may help to relieve pain. Medicines that help to relieve pain and inflammation. Medicines that help to relax your muscles (muscle relaxants). Surgery. This is rare. Follow these instructions at home: Medicines  Take over-the-counter and prescription medicines only as told by your health care provider. Ask your health care provider if the medicine prescribed to you: Requires you to avoid driving or using heavy machinery. Can cause constipation. You may need to take these actions to prevent or treat constipation: Drink enough  fluid to keep your urine pale yellow. Take over-the-counter or prescription medicines. Eat foods that are high in fiber, such as beans, whole grains, and fresh fruits and vegetables. Limit foods that are high in fat and processed sugars, such as fried or sweet foods. If you have a cervical collar: Wear the collar as told by your health care provider. Do not remove it unless told. Ask before making any adjustments to  your collar. If you have long hair, keep it outside of the collar. Ask your health care provider if you may remove the collar for cleaning and bathing. If so: Follow instructions about how to remove it safely. Clean it by hand with mild soap and water and air-dry it completely. If your collar has removable pads, remove them every 1-2 days and wash them by hand with soap and water. Let them air-dry completely before putting them back in the collar. Tell your health care provider if your skin under the collar has irritation or sores. Managing pain, stiffness, and swelling   If directed, use a cervical traction device as told. If directed, put ice on the affected area. To do this: Put ice in a plastic bag. Place a towel between your skin and the bag. Leave the ice on for 20 minutes, 2-3 times a day. If directed, apply heat to the affected area before you do your physical therapy or as often as told by your health care provider. Use the heat source that your health care provider recommends, such as a moist heat pack or a heating pad. Place a towel between your skin and the heat source. Leave the heat on for 20-30 minutes. Remove the heat if your skin turns bright red. This is especially important if you are unable to feel pain, heat, or cold. You may have a greater risk of getting burned. Activity Do not drive while wearing a cervical collar. If you do not have a cervical collar, ask if it is safe to drive while your neck heals. Do not lift anything that is heavier than 10 lb (4.5 kg), or the limit that you are told, until your health care provider says that it is safe. Rest as told by your health care provider. If physical therapy was prescribed, do exercises as told by your health care provider or physical therapist. Return to your normal activities as told by your health care provider. Avoid positions and activities that make your symptoms worse. Ask your health care provider what activities  are safe for you. General instructions Do not use any products that contain nicotine or tobacco, such as cigarettes, e-cigarettes, and chewing tobacco. These can delay healing. If you need help quitting, ask your health care provider. Keep all follow-up visits as told by your health care provider or physical therapist. This is important. How is this prevented? To prevent a cervical sprain from happening again: Use and maintain good posture. Make any needed adjustments to your workstation to help you do this. Exercise regularly as told by your health care provider or physical therapist. Avoid risky activities that may cause a cervical sprain. Contact a health care provider if you have: Symptoms that get worse or do not get better after 2 weeks of treatment. Pain that gets worse or does not get better with medicine. New, unexplained symptoms. Sores or irritated skin on your neck from wearing your cervical collar. Get help right away if: You have severe pain. You develop numbness, tingling, or weakness in any part of your  body. You cannot move a part of your body (you have paralysis). You have neck pain along with severe dizziness or headache. Summary A cervical sprain is a stretch or tear in one or more of the ligaments in the neck. Cervical sprains may be caused by trauma, such as an injury from a motor vehicle accident, a fall, or a sudden forward and backward whipping movement of the head and neck (whiplash injury). Symptoms may develop right away after injury, or they may develop over a few days. This condition may be treated with rest, ice, heat, medicines, physical therapy, and surgery. This information is not intended to replace advice given to you by your health care provider. Make sure you discuss any questions you have with your health care provider. Document Revised: 12/09/2018 Document Reviewed: 12/09/2018 Elsevier Patient Education  [place].

## 2021-03-15 DIAGNOSIS — E785 Hyperlipidemia, unspecified: Secondary | ICD-10-CM | POA: Diagnosis not present

## 2021-03-15 DIAGNOSIS — R3989 Other symptoms and signs involving the genitourinary system: Secondary | ICD-10-CM | POA: Diagnosis not present

## 2021-03-15 DIAGNOSIS — I10 Essential (primary) hypertension: Secondary | ICD-10-CM | POA: Diagnosis not present

## 2021-03-15 DIAGNOSIS — Z87891 Personal history of nicotine dependence: Secondary | ICD-10-CM | POA: Diagnosis not present

## 2021-03-15 DIAGNOSIS — Z7409 Other reduced mobility: Secondary | ICD-10-CM | POA: Diagnosis not present

## 2021-03-15 DIAGNOSIS — Z789 Other specified health status: Secondary | ICD-10-CM | POA: Diagnosis not present

## 2021-03-15 DIAGNOSIS — Z940 Kidney transplant status: Secondary | ICD-10-CM | POA: Diagnosis not present

## 2021-03-15 DIAGNOSIS — F0390 Unspecified dementia without behavioral disturbance: Secondary | ICD-10-CM | POA: Diagnosis not present

## 2021-03-15 DIAGNOSIS — Z8546 Personal history of malignant neoplasm of prostate: Secondary | ICD-10-CM | POA: Diagnosis not present

## 2021-03-15 DIAGNOSIS — R600 Localized edema: Secondary | ICD-10-CM | POA: Diagnosis not present

## 2021-03-15 DIAGNOSIS — B348 Other viral infections of unspecified site: Secondary | ICD-10-CM | POA: Diagnosis not present

## 2021-03-15 DIAGNOSIS — D849 Immunodeficiency, unspecified: Secondary | ICD-10-CM | POA: Diagnosis not present

## 2021-03-15 NOTE — Progress Notes (Signed)
Good news    NO evidence of fracture or acute findings on neck x ray .   There  is some mild arthritis .

## 2021-03-20 ENCOUNTER — Telehealth: Payer: Self-pay

## 2021-03-20 NOTE — Telephone Encounter (Signed)
Noted  

## 2021-03-20 NOTE — Telephone Encounter (Signed)
Patient returned call and was informed of results verbalized understanding

## 2021-03-28 ENCOUNTER — Encounter: Payer: Medicare PPO | Admitting: Internal Medicine

## 2021-03-29 ENCOUNTER — Encounter: Payer: Self-pay | Admitting: Family Medicine

## 2021-03-29 ENCOUNTER — Telehealth (INDEPENDENT_AMBULATORY_CARE_PROVIDER_SITE_OTHER): Payer: Medicare PPO | Admitting: Family Medicine

## 2021-03-29 DIAGNOSIS — R059 Cough, unspecified: Secondary | ICD-10-CM | POA: Diagnosis not present

## 2021-03-29 MED ORDER — BENZONATATE 100 MG PO CAPS: ORAL_CAPSULE | ORAL | 0 refills | Status: DC

## 2021-03-29 NOTE — Patient Instructions (Signed)
-  I sent the medication(s) we discussed to your pharmacy: Meds ordered this encounter  Medications   benzonatate (TESSALON PERLES) 100 MG capsule    Sig: 1-2 capsules up to twice daily as needed for cough    Dispense:  40 capsule    Refill:  0     I hope you are feeling better soon!  Seek in person care promptly if your symptoms worsen, new concerns arise or you are not improving with treatment.  It was nice to meet you today. I help [facility] out with telemedicine visits on Tuesdays and Thursdays and am happy to help if you need a virtual follow up visit on those days. Otherwise, if you have any concerns or questions following this visit please schedule a follow up visit with your Primary Care office or seek care at a local urgent care clinic to avoid delays in care

## 2021-03-29 NOTE — Progress Notes (Signed)
Virtual Visit via Video Note  I connected with Isac  on 03/29/21 at 11:40 AM EST by a video enabled telemedicine application and verified that I am speaking with the correct person using two identifiers.  Location patient: home, [place] Location provider:work or home office Persons participating in the virtual visit: patient, provider  I discussed the limitations of evaluation and management by telemedicine and the availability of in person appointments. The patient expressed understanding and agreed to proceed.   HPI:  Acute telemedicine visit for cough and congestion: -Onset: when diagnosed with Covid 10 days ago -Symptoms include: persistent cough - otherwise symptoms resolved -Denies: CP, SOB, NVD, fevers, chills, thick mucus or hemoptysis -Has tried: tessalon - which helped but he ran out of it; has tried tylenol as well, pseudoephedrine -Pertinent past medical history: see below -Pertinent medication allergies:  Allergies  Allergen Reactions   Codeine Phosphate     Pt unsure of reaction    Lisinopril Cough  -COVID-19 vaccine status:  Immunization History  Administered Date(s) Administered   Hepatitis B 08/06/2005   Influenza Split 01/21/2011, 01/13/2014   Influenza Whole 02/08/2009, 01/11/2010   Influenza, High Dose Seasonal PF 01/13/2017, 12/31/2017   Influenza-Unspecified 12/14/2012   Pneumococcal Conjugate-13 09/14/2013, 01/25/2014   Td 12/07/2002, 04/20/2014    ROS: See pertinent positives and negatives per HPI.  Past Medical History:  Diagnosis Date   Allergy    Arthritis    Cataract    Diabetes mellitus without complication ([place])    ED (erectile dysfunction)    GERD (gastroesophageal reflux disease)    Hx of adenomatous colonic polyps 07/15/2017   Hyperlipidemia    Hypertension    Normal nuclear stress test    myoview neg 12 09 except for HT   Other and unspecified alcohol dependence, unspecified drinking behavior    date of last use 02/13/11   Prostate  cancer ([place]) 02/2017   stage t1c, low risk and asymptomatic followed at [facility]   Proteinuria    past record showed 542m protein excretion per 24 hours eval by renal   PTSD (post-[place] stress disorder)    [place]  [facility]    [facility] History:  Procedure Laterality Date   ls spinal surgery     NM MYOVIEW LTD  12/09   neg except for HT   PROSTATE BIOPSY  2018   stage t1c cancer   TRIGGER FINGER RELEASE Left 10/18/2016   Procedure: LEFT TRIGGER THUMB RELEASE;  Surgeon: TMilly Jakob MD;  [facility] [facility]  Service: Orthopedics;  Laterality: Left;     Current Outpatient Medications:    amLODipine (NORVASC) 5 MG tablet, Take 1 tablet by mouth daily., Disp: , Rfl:    azaTHIOprine (IMURAN) 50 MG tablet, Take 3 tablets by mouth daily., Disp: , Rfl:    benzonatate (TESSALON PERLES) 100 MG capsule, 1-2 capsules up to twice daily as needed for cough, Disp: 40 capsule, Rfl: 0   carvedilol (COREG) 3.125 MG tablet, Take 3.125 mg by mouth 2 (two) times daily., Disp: , Rfl:    donepezil (ARICEPT) 5 MG tablet, TAKE ONE TABLET BY MOUTH AS DIRECTED BY YOUR MEDICAL PROVIDER -  TAKE ONE TABLET BY MOUTH AT BEDTIME FOR 14 DAYS THEN TAKE TWO TABLETS BY  MOUTH AT BEDTIME FOR 14 DAYS THEN TAKE THREE TABLETS BY MOUTH AT BEDTIME  FOR 14 DAYS THEN TAKE FOUR TABLETS BY MOUTH AT BEDTIME -  TAKE ONE TABLET BY MOUTH AT BEDTIME FOR 14 DAYS THEN TAKE TWO TABLETS BY  MOUTH AT BEDTIME FOR 14 DAYS THEN TAKE THREE TABLETS BY MOUTH AT BEDTIME  FOR 14 DAYS THEN TAKE FOUR TABLETS BY MOUTH AT BEDTIME, Disp: , Rfl:    folic acid-vitamin b complex-vitamin c-selenium-zinc (DIALYVITE) 3 MG TABS tablet, Take 1 tablet by mouth daily., Disp: , Rfl:    furosemide (LASIX) 20 MG tablet, Take by mouth as directed., Disp: , Rfl:    ipratropium (ATROVENT) 0.03 % nasal spray, Place 2 sprays into both nostrils as needed., Disp: , Rfl:    Megestrol  Acetate (MEGACE ORAL PO), Take by mouth., Disp: , Rfl:    memantine (NAMENDA) 10 MG tablet, Take by mouth. Take 1/2 tab x 2 weeks, then 1 tab x 2 weeks, then 1.5 tab x 2 weeks then 2 tab daily to continue., Disp: , Rfl:    mirtazapine (REMERON SOL-TAB) 15 MG disintegrating tablet, Take 15 mg by mouth at bedtime., Disp: , Rfl:    Multiple Vitamin (MULTIVITAMIN) tablet, Take 1 tablet by mouth daily., Disp: , Rfl:    omeprazole (PRILOSEC) 20 MG capsule, Take 1 capsule by mouth daily., Disp: , Rfl:    PEG-KCl-NaCl-NaSulf-Na Asc-C (PLENVU) 140 g SOLR, Take 1 kit by mouth as directed., Disp: 1 each, Rfl: 0   potassium chloride (KLOR-CON) 10 MEQ tablet, Take 10 mEq by mouth 2 (two) times daily., Disp: , Rfl:    predniSONE (DELTASONE) 5 MG tablet, Take 1 tablet by mouth daily., Disp: , Rfl:    sulfamethoxazole-trimethoprim (BACTRIM) 400-80 MG tablet, Take by mouth 3 (three) times a week., Disp: , Rfl:    tacrolimus (PROGRAF) 1 MG capsule, Take by mouth as directed., Disp: , Rfl:    tamsulosin (FLOMAX) 0.4 MG CAPS capsule, Take 0.4 mg by mouth., Disp: , Rfl:    traZODone (DESYREL) 50 MG tablet, Take 25 mg by mouth at bedtime as needed for sleep. , Disp: , Rfl:   EXAM:  VITALS per patient if applicable:  GENERAL: alert, oriented, appears well and in no acute distress  HEENT: atraumatic, conjunttiva clear, no obvious abnormalities on inspection of external nose and ears  NECK: normal movements of the head and neck  LUNGS: on inspection no signs of respiratory distress, breathing rate appears normal, no obvious gross SOB, gasping or wheezing  CV: no obvious cyanosis  MS: moves all visible extremities without noticeable abnormality  PSYCH/NEURO: pleasant and cooperative, no obvious depression or anxiety, speech and thought processing grossly intact  ASSESSMENT AND PLAN:  Discussed the following assessment and plan:  Cough, unspecified type  -we discussed possible serious and likely  etiologies, options for evaluation and workup, limitations of telemedicine visit vs in person visit, treatment, treatment risks and precautions. Pt is agreeable to treatment via telemedicine at this moment. He opted for a refill on the Tessalon as that was working for him. Discussed potential complications and precautions.  Advised to seek prompt [facility] follow up or in person care if worsening, new symptoms arise, or if is not improving with treatment. Discussed options for inperson care if PCP office not available. Did let this patient know that I only do telemedicine on Tuesdays and Thursdays for [facility]. Advised to schedule follow up visit with PCP or [facility] if any further questions or concerns to avoid delays in care.   I discussed the assessment and treatment plan with the patient. The patient was provided an opportunity to ask questions and  all were answered. The patient agreed with the plan and demonstrated an understanding of the instructions.     Lucretia Kern, DO

## 2021-04-19 DIAGNOSIS — Z131 Encounter for screening for diabetes mellitus: Secondary | ICD-10-CM | POA: Diagnosis not present

## 2021-04-19 DIAGNOSIS — R413 Other amnesia: Secondary | ICD-10-CM | POA: Diagnosis not present

## 2021-04-19 DIAGNOSIS — Z4822 Encounter for aftercare following kidney transplant: Secondary | ICD-10-CM | POA: Diagnosis not present

## 2021-04-19 DIAGNOSIS — M25551 Pain in right hip: Secondary | ICD-10-CM | POA: Diagnosis not present

## 2021-04-19 DIAGNOSIS — Z7409 Other reduced mobility: Secondary | ICD-10-CM | POA: Diagnosis not present

## 2021-04-19 DIAGNOSIS — I10 Essential (primary) hypertension: Secondary | ICD-10-CM | POA: Diagnosis not present

## 2021-04-19 DIAGNOSIS — F1991 Other psychoactive substance use, unspecified, in remission: Secondary | ICD-10-CM | POA: Diagnosis not present

## 2021-04-19 DIAGNOSIS — Z8616 Personal history of COVID-19: Secondary | ICD-10-CM | POA: Diagnosis not present

## 2021-04-19 DIAGNOSIS — D849 Immunodeficiency, unspecified: Secondary | ICD-10-CM | POA: Diagnosis not present

## 2021-04-19 DIAGNOSIS — E785 Hyperlipidemia, unspecified: Secondary | ICD-10-CM | POA: Diagnosis not present

## 2021-04-19 DIAGNOSIS — F0390 Unspecified dementia without behavioral disturbance: Secondary | ICD-10-CM | POA: Diagnosis not present

## 2021-04-19 DIAGNOSIS — D649 Anemia, unspecified: Secondary | ICD-10-CM | POA: Diagnosis not present

## 2021-04-19 DIAGNOSIS — R600 Localized edema: Secondary | ICD-10-CM | POA: Diagnosis not present

## 2021-04-19 DIAGNOSIS — Z940 Kidney transplant status: Secondary | ICD-10-CM | POA: Diagnosis not present

## 2021-04-20 DIAGNOSIS — Z940 Kidney transplant status: Secondary | ICD-10-CM | POA: Diagnosis not present

## 2021-04-20 DIAGNOSIS — R8281 Pyuria: Secondary | ICD-10-CM | POA: Diagnosis not present

## 2021-05-03 ENCOUNTER — Ambulatory Visit (INDEPENDENT_AMBULATORY_CARE_PROVIDER_SITE_OTHER): Payer: Medicare PPO

## 2021-05-03 VITALS — BP 140/62 | HR 63 | Temp 98.5°F | Ht 71.0 in | Wt 205.0 lb

## 2021-05-03 DIAGNOSIS — Z0000 Encounter for general adult medical examination without abnormal findings: Secondary | ICD-10-CM

## 2021-05-03 NOTE — Patient Instructions (Addendum)
Jeremy Black , Thank you for taking time to come for your Medicare Wellness Visit. I appreciate your ongoing commitment to your health goals. Please review the following plan we discussed and let me know if I can assist you in the future.   These are the goals we discussed:  Goals      Exercise 150 minutes per week (moderate activity)     Start exercising in the pool It is great therapy as well as easy on your joints Will go 3 times a week.     Patient Stated     Set a goal to get a kidney !        This is a list of the screening recommended for you and due dates:  Health Maintenance  Topic Date Due   Complete foot exam   01/27/2020   Hemoglobin A1C  06/03/2021*   Pneumonia Vaccine (2 - PPSV23 if available, else PCV20) 05/03/2022*   Colon Cancer Screening  05/03/2022*   Eye exam for diabetics  12/18/2021   Tetanus Vaccine  04/20/2024   Flu Shot  Completed   COVID-19 Vaccine  Completed   Zoster (Shingles) Vaccine  Completed   HPV Vaccine  Aged Out  *Topic was postponed. The date shown is not the original due date.    Advanced directives: Yes  Conditions/risks identified: None  Next appointment: Follow up in one year for your annual wellness visit.  Preventive Care 76 Years and Older, Male Preventive care refers to lifestyle choices and visits with your health care provider that can promote health and wellness. What does preventive care include? A yearly physical exam. This is also called an annual well check. Dental exams once or twice a year. Routine eye exams. Ask your health care provider how often you should have your eyes checked. Personal lifestyle choices, including: Daily care of your teeth and gums. Regular physical activity. Eating a healthy diet. Avoiding tobacco and drug use. Limiting alcohol use. Practicing safe sex. Taking low doses of aspirin every day. Taking vitamin and mineral supplements as recommended by your health care provider. What happens  during an annual well check? The services and screenings done by your health care provider during your annual well check will depend on your age, overall health, lifestyle risk factors, and family history of disease. Counseling  Your health care provider may ask you questions about your: Alcohol use. Tobacco use. Drug use. Emotional well-being. Home and relationship well-being. Sexual activity. Eating habits. History of falls. Memory and ability to understand (cognition). Work and work Statistician. Screening  You may have the following tests or measurements: Height, weight, and BMI. Blood pressure. Lipid and cholesterol levels. These may be checked every 5 years, or more frequently if you are over 13 years old. Skin check. Lung cancer screening. You may have this screening every year starting at age 8 if you have a 30-pack-year history of smoking and currently smoke or have quit within the past 15 years. Fecal occult blood test (FOBT) of the stool. You may have this test every year starting at age 19. Flexible sigmoidoscopy or colonoscopy. You may have a sigmoidoscopy every 5 years or a colonoscopy every 10 years starting at age 18. Prostate cancer screening. Recommendations will vary depending on your family history and other risks. Hepatitis C blood test. Hepatitis B blood test. Sexually transmitted disease (STD) testing. Diabetes screening. This is done by checking your blood sugar (glucose) after you have not eaten for a while (fasting). You  may have this done every 1-3 years. Abdominal aortic aneurysm (AAA) screening. You may need this if you are a current or former smoker. Osteoporosis. You may be screened starting at age 84 if you are at high risk. Talk with your health care provider about your test results, treatment options, and if necessary, the need for more tests. Vaccines  Your health care provider may recommend certain vaccines, such as: Influenza vaccine. This is  recommended every year. Tetanus, diphtheria, and acellular pertussis (Tdap, Td) vaccine. You may need a Td booster every 10 years. Zoster vaccine. You may need this after age 34. Pneumococcal 13-valent conjugate (PCV13) vaccine. One dose is recommended after age 15. Pneumococcal polysaccharide (PPSV23) vaccine. One dose is recommended after age 17. Talk to your health care provider about which screenings and vaccines you need and how often you need them. This information is not intended to replace advice given to you by your health care provider. Make sure you discuss any questions you have with your health care provider. Document Released: 04/28/2015 Document Revised: 12/20/2015 Document Reviewed: 01/31/2015 Elsevier Interactive Patient Education  2017 [employer] Prevention in the Home Falls can cause injuries. They can happen to people of all ages. There are many things you can do to make your home safe and to help prevent falls. What can I do on the outside of my home? Regularly fix the edges of walkways and driveways and fix any cracks. Remove anything that might make you trip as you walk through a door, such as a raised step or threshold. Trim any bushes or trees on the path to your home. Use bright outdoor lighting. Clear any walking paths of anything that might make someone trip, such as rocks or tools. Regularly check to see if handrails are loose or broken. Make sure that both sides of any steps have handrails. Any raised decks and porches should have guardrails on the edges. Have any leaves, snow, or ice cleared regularly. Use sand or salt on walking paths during winter. Clean up any spills in your garage right away. This includes oil or grease spills. What can I do in the bathroom? Use night lights. Install grab bars by the toilet and in the tub and shower. Do not use towel bars as grab bars. Use non-skid mats or decals in the tub or shower. If you need to sit down in  the shower, use a plastic, non-slip stool. Keep the floor dry. Clean up any water that spills on the floor as soon as it happens. Remove soap buildup in the tub or shower regularly. Attach bath mats securely with double-sided non-slip rug tape. Do not have throw rugs and other things on the floor that can make you trip. What can I do in the bedroom? Use night lights. Make sure that you have a light by your bed that is easy to reach. Do not use any sheets or blankets that are too big for your bed. They should not hang down onto the floor. Have a firm chair that has side arms. You can use this for support while you get dressed. Do not have throw rugs and other things on the floor that can make you trip. What can I do in the kitchen? Clean up any spills right away. Avoid walking on wet floors. Keep items that you use a lot in easy-to-reach places. If you need to reach something above you, use a strong step stool that has a grab bar. Keep electrical  cords out of the way. Do not use floor polish or wax that makes floors slippery. If you must use wax, use non-skid floor wax. Do not have throw rugs and other things on the floor that can make you trip. What can I do with my stairs? Do not leave any items on the stairs. Make sure that there are handrails on both sides of the stairs and use them. Fix handrails that are broken or loose. Make sure that handrails are as long as the stairways. Check any carpeting to make sure that it is firmly attached to the stairs. Fix any carpet that is loose or worn. Avoid having throw rugs at the top or bottom of the stairs. If you do have throw rugs, attach them to the floor with carpet tape. Make sure that you have a light switch at the top of the stairs and the bottom of the stairs. If you do not have them, ask someone to add them for you. What else can I do to help prevent falls? Wear shoes that: Do not have high heels. Have rubber bottoms. Are comfortable  and fit you well. Are closed at the toe. Do not wear sandals. If you use a stepladder: Make sure that it is fully opened. Do not climb a closed stepladder. Make sure that both sides of the stepladder are locked into place. Ask someone to hold it for you, if possible. Clearly mark and make sure that you can see: Any grab bars or handrails. First and last steps. Where the edge of each step is. Use tools that help you move around (mobility aids) if they are needed. These include: Canes. Walkers. Scooters. Crutches. Turn on the lights when you go into a dark area. Replace any light bulbs as soon as they burn out. Set up your furniture so you have a clear path. Avoid moving your furniture around. If any of your floors are uneven, fix them. If there are any pets around you, be aware of where they are. Review your medicines with your doctor. Some medicines can make you feel dizzy. This can increase your chance of falling. Ask your doctor what other things that you can do to help prevent falls. This information is not intended to replace advice given to you by your health care provider. Make sure you discuss any questions you have with your health care provider. Document Released: 01/26/2009 Document Revised: 09/07/2015 Document Reviewed: 05/06/2014 Elsevier Interactive Patient Education  2017 [employer].

## 2021-05-03 NOTE — Progress Notes (Signed)
Subjective:   Jeremy Black is a 76 y.o. male who presents for Medicare Annual/Subsequent preventive examination.  Review of Systems    No ROS Cardiac Risk Factors include: advanced age (>63mn, >>50women);hypertension     Objective:    Today's Vitals   05/03/21 0918  BP: 140/62  Pulse: 63  Temp: 98.5 F (36.9 C)  TempSrc: Oral  SpO2: 98%  Weight: 205 lb (93 kg)  Height: _0  (1.803 m)   Body mass index is 28.59 kg/m.  Advanced Directives 05/03/2021 02/05/2017 10/18/2016 10/17/2016 09/18/2016 04/01/2016 05/04/2014  Does Patient Have a Medical Advance Directive? Yes No Yes Yes No No No  Type of [place] - Living will;Healthcare Power of [place] - - -  Does patient want to make changes to medical advance directive? No - Patient declined - No - Patient declined - - - -  Copy of [place] Chart? No - copy requested - No - copy requested - - - -  Would patient like information on creating a medical advance directive? - Yes (ED - Information included in AVS) - - - - No - patient declined information    Current Medications (verified) Outpatient Encounter Medications as of 05/03/2021  Medication Sig   amLODipine (NORVASC) 5 MG tablet Take 1 tablet by mouth daily.   azaTHIOprine (IMURAN) 50 MG tablet Take 3 tablets by mouth daily.   benzonatate (TESSALON PERLES) 100 MG capsule 1-2 capsules up to twice daily as needed for cough   carvedilol (COREG) 3.125 MG tablet Take 3.125 mg by mouth 2 (two) times daily.   donepezil (ARICEPT) 5 MG tablet TAKE ONE TABLET BY MOUTH AS DIRECTED BY YOUR MEDICAL PROVIDER -  TAKE ONE TABLET BY MOUTH AT BEDTIME FOR 14 DAYS THEN TAKE TWO TABLETS BY  MOUTH AT BEDTIME FOR 14 DAYS THEN TAKE THREE TABLETS BY MOUTH AT BEDTIME  FOR 14 DAYS THEN TAKE FOUR TABLETS BY MOUTH AT BEDTIME -   TAKE ONE TABLET BY MOUTH AT BEDTIME FOR 14 DAYS THEN TAKE TWO TABLETS  BY  MOUTH AT BEDTIME FOR 14 DAYS THEN TAKE THREE TABLETS BY MOUTH AT BEDTIME  FOR 14 DAYS THEN TAKE FOUR TABLETS BY MOUTH AT BEDTIME   folic acid-vitamin b complex-vitamin c-selenium-zinc (DIALYVITE) 3 MG TABS tablet Take 1 tablet by mouth daily.   furosemide (LASIX) 20 MG tablet Take by mouth as directed.   ipratropium (ATROVENT) 0.03 % nasal spray Place 2 sprays into both nostrils as needed.   Megestrol Acetate (MEGACE ORAL PO) Take by mouth.   memantine (NAMENDA) 10 MG tablet Take by mouth. Take 1/2 tab x 2 weeks, then 1 tab x 2 weeks, then 1.5 tab x 2 weeks then 2 tab daily to continue.   mirtazapine (REMERON SOL-TAB) 15 MG disintegrating tablet Take 15 mg by mouth at bedtime.   Multiple Vitamin (MULTIVITAMIN) tablet Take 1 tablet by mouth daily.   omeprazole (PRILOSEC) 20 MG capsule Take 1 capsule by mouth daily.   PEG-KCl-NaCl-NaSulf-Na Asc-C (PLENVU) 140 g SOLR Take 1 kit by mouth as directed.   potassium chloride (KLOR-CON) 10 MEQ tablet Take 10 mEq by mouth 2 (two) times daily.   predniSONE (DELTASONE) 5 MG tablet Take 1 tablet by mouth daily.   sulfamethoxazole-trimethoprim (BACTRIM) 400-80 MG tablet Take by mouth 3 (three) times a week.   tacrolimus (PROGRAF) 1 MG capsule Take by mouth as directed.  tamsulosin (FLOMAX) 0.4 MG CAPS capsule Take 0.4 mg by mouth.   traZODone (DESYREL) 50 MG tablet Take 25 mg by mouth at bedtime as needed for sleep.    No facility-administered encounter medications on file as of 05/03/2021.    Allergies (verified) Codeine phosphate and Lisinopril   History: Past Medical History:  Diagnosis Date   Allergy    Arthritis    Cataract    Diabetes mellitus without complication ([place])    ED (erectile dysfunction)    GERD (gastroesophageal reflux disease)    Hx of adenomatous colonic polyps 07/15/2017   Hyperlipidemia    Hypertension    Normal nuclear stress test    myoview neg 12 09 except for HT   Other and unspecified alcohol dependence,  unspecified drinking behavior    date of last use 02/13/11   Prostate cancer ([place]) 02/2017   stage t1c, low risk and asymptomatic followed at [facility]   Proteinuria    past record showed 521m protein excretion per 24 hours eval by renal   [place]SD ([place] stress disorder)    [place]   Past Surgical History:  Procedure Laterality Date   ls spinal surgery     NM MYOVIEW LTD  12/09   neg except for HT   PROSTATE BIOPSY  2018   stage t1c cancer   TRIGGER FINGER RELEASE Left 10/18/2016   Procedure: LEFT TRIGGER THUMB RELEASE;  Surgeon: TMilly Jakob MD;  [place]: [place]opedics;  Laterality: Left;   Family History  Problem Relation Age of Onset   Diabetes Mother    Hypertension Mother    Other Mother        nerves   Throat cancer Father        deceased   Coronary artery disease Sister    Diabetes Sister    Heart disease Sister    Hypertension Brother    Alcohol abuse Paternal Aunt    Alcohol abuse Cousin    Drug abuse Son        cocaine dependent   Social History   Socioeconomic History   Marital status: Legally Separated    Spouse name: Not on file   Number of children: Not on file   Years of education: Not on file   Highest education level: Not on file  Occupational History   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   Working with disabled adults.    Playedgolf  Hunting in past          Social Determinants of Health   Financial Resource Strain: Low Risk    Difficulty of Paying Living Expenses: Not hard at all  Food Insecurity: No Food Insecurity   Worried About Charity fundraiser in the Last Year: Never true   Arboriculturist in the Last Year: Never true  Transportation Needs: No Transportation Needs   Lack of Transportation (Medical): No   Lack of Transportation (Non-Medical): No  Physical Activity: Insufficiently Active   Days of Exercise per Week: 3 days   Minutes of Exercise per Session: 30 min  Stress: No Stress Concern Present   Feeling of Stress : Not at all  Social Connections: Socially Integrated   Frequency of Communication with Friends and Family: More than three times a week   Frequency of Social Gatherings with Friends and Family: More than three times a week   Attends Religious Services: More than 4 times per year   Active Member of [employer] or Organizations: Yes   Attends Music therapist: More than 4 times per year   Marital Status: Married    Clinical Intake: Nutrition Risk Assessment:  Has the patient had any N/V/D within the last 2 months?  No  Does the patient have any non-healing wounds?  No  Has the patient had any unintentional weight loss or weight gain?  No   Diabetes:  Is the patient diabetic?  Yes   Patient has Dx of diabeties patient denies.  If diabetic, was a CBG obtained today?  No    Diabetic Foot Exam: Completed Yes.  Followed by [facility] Pre-visit preparation completed: Yes Activities of Daily  Living In your present state of health, do you have any difficulty performing the following activities: 05/03/2021  Hearing? N  Vision? N  Difficulty concentrating or making decisions? N  Dressing or bathing? N  Doing errands, shopping? N  Comment Wife assist  Using the Toilet? N  In the past six months, have you accidently leaked urine? N  Do you have problems with loss of bowel control? N  Managing your Medications? N  Managing your Finances? N  Housekeeping or managing your Housekeeping? N  Comment Wife Assist  Some recent data might be hidden    Patient Care Team: Panosh, Standley Brooking, MD as PCP - General Corliss Parish, MD (Nephrology) Psychiatrist and PCP at the [facility], MD as Consulting Physician (Neurosurgery) Vanita Ingles, MD as Consulting Physician (Cardiology) sheila Tapp as Attending Physician  Indicate any recent Medical Services you may have received from other than [facility] providers in the past year (date may be approximate).     Assessment:   This is a routine wellness examination for [place].  Hearing/Vision screen Hearing Screening - Comments:: No difficulty hearing Vision Screening - Comments:: Wears glasses. Followed by [facility]  Dietary issues and exercise activities discussed: Current Exercise Habits: Home exercise routine, Type of exercise: stretching;walking, Time (Minutes): 30, Frequency (Times/Week): 3, Weekly Exercise (Minutes/Week): 90, Intensity: Moderate   Goals Addressed             This Visit's Progress    Exercise 150 minutes per week (moderate activity)  Start exercising in the pool It is great therapy as well as easy on your joints Will go 3 times a week.       Depression Screen PHQ 2/9 Scores 05/03/2021 03/13/2021 01/05/2019 09/16/2017 09/18/2016 06/06/2016 05/16/2016  PHQ - 2 Score 0 3 0 1 0 0 0  PHQ- 9 Score 0 7 - - - - -  Exception Documentation - - - - - Other- indicate reason in comment  box -    Fall Risk Fall Risk  05/03/2021 11/14/2020 01/05/2019 09/16/2017 09/18/2016  Falls in the past year? 0 1 0 No No  Number falls in past yr: 0 1 0 - -  Injury with Fall? 0 0 0 - -  Risk for fall due to : - - - - -    FALL RISK PREVENTION PERTAINING TO THE HOME:  Any stairs in or around the home? Yes  If so, are there any without handrails? No  Home free of loose throw rugs in walkways, pet beds, electrical cords, etc? Yes  Adequate lighting in your home to reduce risk of falls? Yes   ASSISTIVE DEVICES UTILIZED TO PREVENT FALLS:  Life alert? No  Use of a cane, walker or w/c? Yes  Grab bars in the bathroom? Yes  Shower chair or bench in shower? Yes  Elevated toilet seat or a handicapped toilet? Yes   TIMED UP AND GO:  Was the test performed? Yes .  Length of time to ambulate 10 feet: 5 sec.   Gait slow steady with use of device. Walker  Cognitive Function: MMSE - Mini Mental State Exam 09/16/2017 09/18/2016  Not completed: (No Data) -  Orientation to time - 5  Orientation to Place - 5  Registration - 3  Attention/ Calculation - 5  Recall - 1  Language- name 2 objects - 2  Language- repeat - 1  Language- follow 3 step command - 3  Language- read & follow direction - 1  Write a sentence - 1  Copy design - 0  Total score - 27     6CIT Screen 05/03/2021  What Year? 0 points  What month? 0 points  What time? 0 points  Count back from 20 0 points  Months in reverse 0 points  Repeat phrase 0 points  Total Score 0    Immunizations Immunization History  Administered Date(s) Administered   Hepatitis B 08/06/2005   Influenza Split 01/21/2011, 01/13/2014   Influenza Whole 02/08/2009, 01/11/2010   Influenza, High Dose Seasonal PF 01/13/2017, 12/31/2017   Influenza-Unspecified 12/14/2012   Pneumococcal Conjugate-13 09/14/2013, 01/25/2014   Td 12/07/2002, 04/20/2014    Pneumococcal vaccine status: Due, Education has been provided regarding the importance of this  vaccine. Advised may receive this vaccine at local pharmacy or Health Dept. Aware to provide a copy of the vaccination record if obtained from local pharmacy or Health Dept. Verbalized acceptance and understanding.   Screening Tests Health Maintenance  Topic Date Due   FOOT EXAM  01/27/2020   HEMOGLOBIN A1C  06/03/2021 (Originally 07/28/2019)   Pneumonia Vaccine 36+ Years old (2 - PPSV23 if available, else PCV20) 05/03/2022 (Originally 01/26/2015)   COLONOSCOPY (Pts 45-60yr Insurance coverage will need to be confirmed)  05/03/2022 (Originally 07/08/2020)   OPHTHALMOLOGY EXAM  12/18/2021   TETANUS/TDAP  04/20/2024   INFLUENZA VACCINE  Completed   COVID-19 Vaccine  Completed   Zoster Vaccines- Shingrix  Completed   HPV VACCINES  Aged Out    Health Maintenance  Health  Maintenance Due  Topic Date Due   FOOT EXAM  01/27/2020   Colorectal Cancer Screening: Patient deferred.   Additional Screening:   Vision Screening: Recommended annual ophthalmology exams for early detection of glaucoma and other disorders of the eye. Is the patient up to date with their annual eye exam?  Yes  Who is the provider or what is the name of the office in which the patient attends annual eye exams? Followed by [facility]   Dental Screening: Recommended annual dental exams for proper oral hygiene  Community Resource Referral / Chronic Care Management:  CRR required this visit?  No   CCM required this visit?  No      Plan:     I have personally reviewed and noted the following in the patients chart:   Medical and social history Use of alcohol, tobacco or illicit drugs  Current medications and supplements including opioid prescriptions. Patient is not currently taking opioid prescriptions. Functional ability and status Nutritional status Physical activity Advanced directives List of other physicians Hospitalizations, surgeries, and ER visits in previous 12  months Vitals Screenings to include cognitive, depression, and falls Referrals and appointments  In addition, I have reviewed and discussed with patient certain preventive protocols, quality metrics, and best practice recommendations. A written personalized care plan for preventive services as well as general preventive health recommendations were provided to patient.     Criselda Peaches, LPN   10/13/4101   Nurse Note: Patient due for Hemoglobin A1C. Patient has office Appt 05/09/21

## 2021-05-09 ENCOUNTER — Ambulatory Visit: Payer: Medicare PPO | Admitting: Internal Medicine

## 2021-05-09 ENCOUNTER — Encounter: Payer: Self-pay | Admitting: Internal Medicine

## 2021-05-09 VITALS — BP 168/70 | HR 69 | Temp 97.5°F | Ht 71.0 in | Wt 202.0 lb

## 2021-05-09 DIAGNOSIS — S161XXS Strain of muscle, fascia and tendon at neck level, sequela: Secondary | ICD-10-CM | POA: Diagnosis not present

## 2021-05-09 DIAGNOSIS — Z940 Kidney transplant status: Secondary | ICD-10-CM | POA: Diagnosis not present

## 2021-05-09 DIAGNOSIS — I10 Essential (primary) hypertension: Secondary | ICD-10-CM

## 2021-05-09 DIAGNOSIS — M542 Cervicalgia: Secondary | ICD-10-CM

## 2021-05-09 NOTE — Patient Instructions (Addendum)
Will do referral to Physical therapy .   Agree with  fu BP control  .   Ask about indigestion meds such as famotidine or pepcid.
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
## 2021-05-18 ENCOUNTER — Other Ambulatory Visit: Payer: Self-pay

## 2021-05-18 ENCOUNTER — Ambulatory Visit: Payer: Medicare PPO | Attending: Internal Medicine | Admitting: Physical Therapy

## 2021-05-18 ENCOUNTER — Encounter: Payer: Self-pay | Admitting: Physical Therapy

## 2021-05-18 DIAGNOSIS — M436 Torticollis: Secondary | ICD-10-CM | POA: Diagnosis not present

## 2021-05-18 DIAGNOSIS — S161XXS Strain of muscle, fascia and tendon at neck level, sequela: Secondary | ICD-10-CM | POA: Diagnosis not present

## 2021-05-18 DIAGNOSIS — M542 Cervicalgia: Secondary | ICD-10-CM | POA: Diagnosis not present
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
## 2021-05-18 NOTE — Patient Instructions (Signed)
Access Code: [email or web address] URL: [email or web address] Date: 05/18/2021 Prepared by: Earlie Counts  Exercises Seated Cervical Sidebending Stretch - 1 x daily - 7 x weekly - 2 sets - 5 reps - 5 sec hold Seated Cervical Rotation AROM - 1 x daily - 7 x weekly - 2 sets - 5 reps - 5 sec hold Seated Cervical Retraction Protraction AROM - 1 x daily - 7 x weekly - 1 sets - 5 reps - 5 sec hold [facility] [address] [place], [place] [address] Phone # [phone] Fax [phone]

## 2021-05-24 DIAGNOSIS — Z940 Kidney transplant status: Secondary | ICD-10-CM | POA: Diagnosis not present

## 2021-05-24 DIAGNOSIS — D849 Immunodeficiency, unspecified: Secondary | ICD-10-CM | POA: Diagnosis not present

## 2021-05-25 ENCOUNTER — Ambulatory Visit: Payer: Medicare PPO | Admitting: Rehabilitative and Restorative Service Providers"

## 2021-05-25 ENCOUNTER — Encounter: Payer: Self-pay | Admitting: Rehabilitative and Restorative Service Providers"

## 2021-05-25 ENCOUNTER — Other Ambulatory Visit: Payer: Self-pay

## 2021-05-25 DIAGNOSIS — M436 Torticollis: Secondary | ICD-10-CM

## 2021-05-25 DIAGNOSIS — M542 Cervicalgia: Secondary | ICD-10-CM | POA: Diagnosis not present

## 2021-05-25 DIAGNOSIS — S161XXS Strain of muscle, fascia and tendon at neck level, sequela: Secondary | ICD-10-CM | POA: Diagnosis not present

## 2021-05-25 NOTE — Therapy (Signed)
[facility] @ [place], [place], [address] Phone: [phone]   Fax:  [phone]  Physical Therapy Treatment  Patient Details  Name: Jeremy Black MRN: [id number] Date of Birth: [birth year] Referring Provider (Jeremy Black): Dr. Shanon Ace   Encounter Date: 05/25/2021   Jeremy Black End of Session - 05/25/21 0852     Visit Number 2    Date for Jeremy Black Re-Evaluation 07/13/21    Authorization Type [employer]    Jeremy Black Start Time 0845    Jeremy Black Stop Time 0925    Jeremy Black Time Calculation (min) 40 min    Activity Tolerance Patient tolerated treatment well    Behavior During Therapy [facility] for tasks assessed/performed             Past Medical History:  Diagnosis Date   Allergy    Arthritis    Cataract    Diabetes mellitus without complication ([place])    ED (erectile dysfunction)    GERD (gastroesophageal reflux disease)    Hx of adenomatous colonic polyps 07/15/2017   Hyperlipidemia    Hypertension    Normal nuclear stress test    myoview neg 12 09 except for HT   Other and unspecified alcohol dependence, unspecified drinking behavior    date of last use 02/13/11   Prostate cancer ([place]) 02/2017   stage t1c, low risk and asymptomatic followed at [facility]   Proteinuria    past record showed 500mg  protein excretion per 24 hours eval by renal   PTSD (post-traumatic stress disorder)    from [place] experience [place]   [facility]    [facility] History:  Procedure Laterality Date   ls spinal surgery     NM MYOVIEW LTD  12/09   neg except for HT   PROSTATE BIOPSY  2018   stage t1c cancer   TRIGGER FINGER RELEASE Left 10/18/2016   Procedure: LEFT TRIGGER THUMB RELEASE;  Surgeon: Milly Jakob, MD;  Location: [facility];  Service: Orthopedics;  Laterality: Left;    There were no vitals filed for this visit.   Subjective Assessment - 05/25/21 0852     Subjective Jeremy Black reports that he has been  doing his HEP and the exercise classes that are similar at his adult day program.    Pertinent History dementia, prostate cancer, PTSD    Currently in Pain? Yes    Pain Score 4     Pain Location Neck    Pain Orientation Right    Pain Descriptors / Indicators Simonne Martinet Adult Jeremy Black Treatment/Exercise - 05/25/21 0001       Exercises   Exercises Neck      Neck Exercises: Machines for Strengthening   UBE (Upper Arm Bike) L 1.0 x3 min each way      Neck Exercises: Theraband   Shoulder Extension 20 reps;Red    Rows 20 reps;Red    Shoulder External Rotation 20 reps;Red    Horizontal ADduction 20 reps;Red      Neck Exercises: Seated   Neck Retraction 20 reps    Cervical Rotation Both;10 reps    Cervical Rotation Limitations cervical flexion/extension x10 reps A/ROM    W Back 20 reps    W Back Weights (lbs) 2  Shoulder Rolls Backwards;20 reps    Shoulder Flexion Both;20 reps    Shoulder Flexion Weights (lbs) 2    Shoulder ABduction Both;20 reps    Shoulder Abduction Weights (lbs) 2      Neck Exercises: Stretches   Upper Trapezius Stretch Right;Left;2 reps;20 seconds    Levator Stretch Right;Left;2 reps;20 seconds    Other Neck Stretches seated ball behind back with thoracic extension stretch x20      Manual Therapy   Manual Therapy Soft tissue mobilization;Myofascial release;Manual Traction    Soft tissue mobilization STM to cervical paraspinals and bilateral upper traps for muscular elongation.    Myofascial Release manual trigger point release to left paraspinals    Manual Traction manual traction 15 sec hold x2 reps                       Jeremy Black Short Term Goals - 05/25/21 0938       Jeremy Black SHORT TERM GOAL #1   Title Patient will be independent with initial HEP for cervical ROM exercises    Status On-going               Jeremy Black Long Term Goals - 05/18/21 0843       Jeremy Black LONG TERM GOAL #1   Title Patient is  independent with advanced HEP for cervical ROM and postural exercises    Time 8    Period Weeks    Status New    Target Date 07/13/21      Jeremy Black LONG TERM GOAL #2   Title Patient is able to turn his head to the right to look behind himself while driving with pain level </=1/10    Time 8    Period Weeks    Status New    Target Date 07/13/21      Jeremy Black LONG TERM GOAL #3   Title Patient is able to look to the right to see a person walking beside himself with increased cervical rotation >/= 75 degrees and pain level </= 1/10    Time 8    Period Weeks    Status New    Target Date 07/13/21      Jeremy Black LONG TERM GOAL #4   Title Patient understands how to hold his head upward with decreased forward head due to increased in postureal strength and awareness    Time 8    Period Weeks    Status New    Target Date 07/13/21                   Plan - 05/25/21 0934     Clinical Impression Statement Jeremy Black tolerated session well, though he was limited in his standing exercises secondary to fatigue. Jeremy Black with tight cervical and upper trap musculature and reported decreased pain following manual therapy.  Jeremy Black with tightness noted in throacic spine and added extension stretch to assist. Jeremy Black continues to require skilled Jeremy Black to address his functional impairments.    Personal Factors and Comorbidities Age;Comorbidity 3+    Comorbidities Kidney transplant 2022; Hx prostate cancer; PTSD, dementia    Jeremy Black Treatment/Interventions ADLs/Self Care Home Management;Cryotherapy;Electrical Stimulation;Moist Heat;Therapeutic activities;Therapeutic exercise;Neuromuscular re-education;Patient/family education;Manual techniques;Passive range of motion;Dry needling;Joint Manipulations;Traction    Jeremy Black Next Visit Plan joint mobilization to cervical; possible dry needling to cervical muscles; cervical ROM exercises; UBE; postural strength    Jeremy Black Home Exercise Plan Access Code: [email or web address]    Consulted and Agree with Plan of Care  Patient  Patient will benefit from skilled therapeutic intervention in order to improve the following deficits and impairments:  Decreased range of motion, Increased fascial restricitons, Pain, Decreased activity tolerance, Decreased mobility  Visit Diagnosis: Cervicalgia  Stiffness of cervical spine     Problem List Patient Active Problem List   Diagnosis Date Noted   Hypotension 11/11/2017   ESRD (end stage renal disease) on dialysis ([place]) 11/11/2017   Hx of adenomatous colonic polyps 07/15/2017   Prostate cancer ([place]) 02/13/2017   Type 2 diabetes mellitus with diabetic nephropathy ([place]) 11/17/2013   Diabetes mellitus, type 2 ([place]) 09/14/2013   Leg cramps 02/23/2013   Other male sexual dysfunction 02/23/2013   CMC arthritis, thumb, degenerative 08/25/2012   Diabetes mellitus, new onset ([place]) 07/21/2012   Thumb pain 07/21/2012   SOB (shortness of breath) 06/30/2012   Bradycardia 06/30/2012   S/P dilatation of esophageal stricture    Renal insufficiency  cr 1.3 range 01/26/2012   Burning pain 01/21/2011   OBESITY 01/11/2010   OSTEOARTHRITIS, HAND 01/11/2010   TOBACCO USE, QUIT 06/29/2009   TRIGGER FINGER 10/31/2008   CHEST PAIN, ATYPICAL 03/15/2008   Proteinuria 11/19/2006   HYPERLIPIDEMIA 10/22/2006   ERECTILE DYSFUNCTION 10/22/2006   Essential hypertension 10/22/2006   DEGENERATION, [place] NOS 10/22/2006   INSOMNIA 10/22/2006   POSITIVE PPD 10/22/2006    Jeremy Black, Jeremy Black, Jeremy Black 05/25/2021, 9:39 AM  [facility] @ [place], [place], [address] Phone: [phone]   Fax:  [phone]  Name: Jeremy Black MRN: [id number] Date of Birth: 31-Mar-1946

## 2021-06-01 ENCOUNTER — Telehealth: Payer: Self-pay | Admitting: Rehabilitative and Restorative Service Providers"

## 2021-06-01 ENCOUNTER — Other Ambulatory Visit: Payer: Self-pay

## 2021-06-01 ENCOUNTER — Ambulatory Visit: Payer: Medicare PPO | Admitting: Rehabilitative and Restorative Service Providers"

## 2021-06-01 NOTE — Telephone Encounter (Signed)
Called pt and left message to notify pt of missed visit on 06/01/2021.  Asked to please call back with any questions and reminded about next appointment scheduled.

## 2021-06-08 ENCOUNTER — Encounter: Payer: Self-pay | Admitting: Rehabilitative and Restorative Service Providers"

## 2021-06-08 ENCOUNTER — Other Ambulatory Visit: Payer: Self-pay

## 2021-06-08 ENCOUNTER — Ambulatory Visit: Payer: Medicare PPO | Admitting: Rehabilitative and Restorative Service Providers"

## 2021-06-08 DIAGNOSIS — S161XXS Strain of muscle, fascia and tendon at neck level, sequela: Secondary | ICD-10-CM | POA: Diagnosis not present

## 2021-06-08 DIAGNOSIS — M436 Torticollis: Secondary | ICD-10-CM

## 2021-06-08 DIAGNOSIS — M542 Cervicalgia: Secondary | ICD-10-CM

## 2021-06-08 NOTE — Therapy (Signed)
[facility] @ [facility] [place], [place], [address] Phone: [phone]   Fax:  [phone]  Physical Therapy Treatment  Patient Details  Name: Jeremy Black MRN: [id number] Date of Birth: [birth year] Referring Provider (PT): Dr. Shanon Ace   Encounter Date: 06/08/2021   PT End of Session - 06/08/21 0848     Visit Number 3    Date for PT Re-Evaluation 07/13/21    Authorization Type Humana    Authorization Time Period 05/18/21-07/13/21    Authorization - Visit Number 3    Authorization - Number of Visits 12    PT Start Time 0845    PT Stop Time 0925    PT Time Calculation (min) 40 min    Activity Tolerance Patient tolerated treatment well    Behavior During Therapy [facility] for tasks assessed/performed             Past Medical History:  Diagnosis Date   Allergy    Arthritis    Cataract    Diabetes mellitus without complication ([place])    ED (erectile dysfunction)    GERD (gastroesophageal reflux disease)    Hx of adenomatous colonic polyps 07/15/2017   Hyperlipidemia    Hypertension    Normal nuclear stress test    myoview neg 12 09 except for HT   Other and unspecified alcohol dependence, unspecified drinking behavior    date of last use 02/13/11   Prostate cancer ([place]) 02/2017   stage t1c, low risk and asymptomatic followed at [facility]   Proteinuria    past record showed 500mg  protein excretion per 24 hours eval by renal   PTSD (post-traumatic stress disorder)    from [place] experience [place]   [facility]    [facility] Surgical History:  Procedure Laterality Date   ls spinal surgery     NM MYOVIEW LTD  12/09   neg except for HT   PROSTATE BIOPSY  2018   stage t1c cancer   TRIGGER FINGER RELEASE Left 10/18/2016   Procedure: LEFT TRIGGER THUMB RELEASE;  Surgeon: Milly Jakob, MD;  Location: [place];  Service: Orthopedics;  Laterality: Left;    There were  no vitals filed for this visit.   Subjective Assessment - 06/08/21 0847     Subjective Pt is reporting some back pain today.  States that he neck is doing better.    Pertinent History dementia, prostate cancer, PTSD    Currently in Pain? Yes    Pain Score 4     Pain Location Neck    Pain Orientation Right    Pain Descriptors / Indicators Aching    Pain Type Acute pain                               [facility] Adult PT Treatment/Exercise - 06/08/21 0001       Neck Exercises: Machines for Strengthening   UBE (Upper Arm Bike) L 1.0 x3 min each way      Neck Exercises: Theraband   Shoulder Extension 20 reps;Red    Rows 20 reps;Red    Shoulder External Rotation 20 reps;Red    Horizontal ADduction 20 reps;Red      Neck Exercises: Seated   Neck Retraction 20 reps    Cervical Rotation Both;15 reps    Cervical Rotation Limitations cervical flexion/extension x15 reps A/ROM  W Back 20 reps    W Back Weights (lbs) 3    Shoulder Rolls Backwards;20 reps    Shoulder Flexion Both;20 reps    Shoulder Flexion Weights (lbs) 3    Shoulder ABduction Both;20 reps    Shoulder Abduction Weights (lbs) 3    Other Seated Exercise Overhead shoulder press and seated chest press 3# 2x10B      Neck Exercises: Stretches   Upper Trapezius Stretch Right;Left;2 reps;20 seconds    Other Neck Stretches seated ball behind back with thoracic extension stretch x20      Manual Therapy   Manual Therapy Soft tissue mobilization;Myofascial release;Manual Traction    Soft tissue mobilization STM to cervical paraspinals and bilateral upper traps for muscular elongation.    Myofascial Release manual trigger point release to left paraspinals    Manual Traction manual traction 15 sec hold x2 reps                       PT Short Term Goals - 06/08/21 0930       PT SHORT TERM GOAL #1   Title Patient will be independent with initial HEP for cervical ROM exercises    Status Achieved                PT Long Term Goals - 06/08/21 0930       PT LONG TERM GOAL #1   Title Patient is independent with advanced HEP for cervical ROM and postural exercises    Status On-going      PT LONG TERM GOAL #2   Title Patient is able to turn his head to the right to look behind himself while driving with pain level </=1/10    Status On-going      PT LONG TERM GOAL #3   Title Patient is able to look to the right to see a person walking beside himself with increased cervical rotation >/= 75 degrees and pain level </= 1/10    Status On-going      PT LONG TERM GOAL #4   Title Patient understands how to hold his head upward with decreased forward head due to increased in postureal strength and awareness    Status On-going                   Plan - 06/08/21 0927     Clinical Impression Statement Mr Caliendo appologized for missing last session, he states that his wife got confused with the appointment times as his was earlier than hers.  Pt able to progress with increased weights during session today and only requires brief seated recovery periods between ther ex.  Pt with increased tightness in upper traps and reports decreased pain following manual therapy and manual trigger point release.  Pt continues to participate in [facility] exercise class 3x/week.  He continues to require skilled PT to progress towards goal related activities and decreased pain.    Personal Factors and Comorbidities Age;Comorbidity 3+    Comorbidities Kidney transplant 2022; Hx prostate cancer; PTSD, dementia    PT Treatment/Interventions ADLs/Self Care Home Management;Cryotherapy;Electrical Stimulation;Moist Heat;Therapeutic activities;Therapeutic exercise;Neuromuscular re-education;Patient/family education;Manual techniques;Passive range of motion;Dry needling;Joint Manipulations;Traction    PT Next Visit Plan joint mobilization to cervical; possible dry needling to cervical muscles; cervical ROM  exercises; UBE; postural strength    Consulted and Agree with Plan of Care Patient             Patient will benefit from skilled  therapeutic intervention in order to improve the following deficits and impairments:  Decreased range of motion, Increased fascial restricitons, Pain, Decreased activity tolerance, Decreased mobility  Visit Diagnosis: Cervicalgia  Stiffness of cervical spine     Problem List Patient Active Problem List   Diagnosis Date Noted   Hypotension 11/11/2017   ESRD (end stage renal disease) on dialysis ([place]) 11/11/2017   Hx of adenomatous colonic polyps 07/15/2017   Prostate cancer ([employer]) 02/13/2017   Type 2 diabetes mellitus with diabetic nephropathy ([place]) 11/17/2013   Diabetes mellitus, type 2 ([place]) 09/14/2013   Leg cramps 02/23/2013   Other male sexual dysfunction 02/23/2013   CMC arthritis, thumb, degenerative 08/25/2012   Diabetes mellitus, new onset ([place]) 07/21/2012   Thumb pain 07/21/2012   SOB (shortness of breath) 06/30/2012   Bradycardia 06/30/2012   S/P dilatation of esophageal stricture    Renal insufficiency  cr 1.3 range 01/26/2012   Burning pain 01/21/2011   OBESITY 01/11/2010   OSTEOARTHRITIS, HAND 01/11/2010   TOBACCO USE, QUIT 06/29/2009   TRIGGER FINGER 10/31/2008   CHEST PAIN, ATYPICAL 03/15/2008   Proteinuria 11/19/2006   HYPERLIPIDEMIA 10/22/2006   ERECTILE DYSFUNCTION 10/22/2006   Essential hypertension 10/22/2006   DEGENERATION, DISC NOS 10/22/2006   INSOMNIA 10/22/2006   POSITIVE PPD 10/22/2006    Juel Burrow, PT 06/08/2021, 9:31 AM  [facility] @ [address] [place], [place], [address] Phone: [phone]   Fax:  [phone]  Name: TYRION GLAUDE MRN: [id number] Date of Birth: Sep 19, 1945

## 2021-06-15 ENCOUNTER — Encounter: Payer: Medicare PPO | Admitting: Rehabilitative and Restorative Service Providers"

## 2021-06-21 DIAGNOSIS — I10 Essential (primary) hypertension: Secondary | ICD-10-CM | POA: Diagnosis not present

## 2021-06-21 DIAGNOSIS — D649 Anemia, unspecified: Secondary | ICD-10-CM | POA: Diagnosis not present

## 2021-06-21 DIAGNOSIS — D849 Immunodeficiency, unspecified: Secondary | ICD-10-CM | POA: Diagnosis not present

## 2021-06-21 DIAGNOSIS — B348 Other viral infections of unspecified site: Secondary | ICD-10-CM | POA: Diagnosis not present

## 2021-06-21 DIAGNOSIS — F32A Depression, unspecified: Secondary | ICD-10-CM | POA: Diagnosis not present

## 2021-06-21 DIAGNOSIS — F0390 Unspecified dementia without behavioral disturbance: Secondary | ICD-10-CM | POA: Diagnosis not present

## 2021-06-21 DIAGNOSIS — Z7952 Long term (current) use of systemic steroids: Secondary | ICD-10-CM | POA: Diagnosis not present

## 2021-06-21 DIAGNOSIS — E785 Hyperlipidemia, unspecified: Secondary | ICD-10-CM | POA: Diagnosis not present

## 2021-06-21 DIAGNOSIS — Z4822 Encounter for aftercare following kidney transplant: Secondary | ICD-10-CM | POA: Diagnosis not present

## 2021-06-21 DIAGNOSIS — E782 Mixed hyperlipidemia: Secondary | ICD-10-CM | POA: Diagnosis not present

## 2021-06-21 DIAGNOSIS — Z1329 Encounter for screening for other suspected endocrine disorder: Secondary | ICD-10-CM | POA: Diagnosis not present

## 2021-06-21 DIAGNOSIS — Z940 Kidney transplant status: Secondary | ICD-10-CM | POA: Diagnosis not present

## 2021-06-21 DIAGNOSIS — Z7409 Other reduced mobility: Secondary | ICD-10-CM | POA: Diagnosis not present

## 2021-06-21 DIAGNOSIS — E876 Hypokalemia: Secondary | ICD-10-CM | POA: Diagnosis not present

## 2021-06-21 DIAGNOSIS — E119 Type 2 diabetes mellitus without complications: Secondary | ICD-10-CM | POA: Diagnosis not present

## 2021-06-21 DIAGNOSIS — N186 End stage renal disease: Secondary | ICD-10-CM | POA: Diagnosis not present

## 2021-06-22 ENCOUNTER — Ambulatory Visit: Payer: Medicare PPO | Attending: Internal Medicine | Admitting: Rehabilitative and Restorative Service Providers"

## 2021-06-22 ENCOUNTER — Other Ambulatory Visit: Payer: Self-pay

## 2021-06-22 ENCOUNTER — Encounter: Payer: Self-pay | Admitting: Rehabilitative and Restorative Service Providers"

## 2021-06-22 DIAGNOSIS — M542 Cervicalgia: Secondary | ICD-10-CM | POA: Diagnosis not present

## 2021-06-22 DIAGNOSIS — M436 Torticollis: Secondary | ICD-10-CM | POA: Insufficient documentation

## 2021-06-22 NOTE — Therapy (Signed)
[facility] ?[facility] @ [place] ?[place], [place], [address] ?Phone: [phone]   Fax:  [phone] ? ?Physical Therapy Treatment ? ?Patient Details  ?Name: Jeremy Black ?MRN: [id number] ?Date of Birth: October 23, 1945 ?Referring Provider (PT): Dr. Shanon Ace ? ? ?Encounter Date: 06/22/2021 ? ? PT End of Session - 06/22/21 6237   ? ? Visit Number 4   ? Date for PT Re-Evaluation 07/13/21   ? Authorization Type Humana   ? Authorization Time Period 05/18/21-07/13/21   ? Authorization - Visit Number 4   ? Authorization - Number of Visits 12   ? PT Start Time [phone]   ? PT Stop Time 0925   ? PT Time Calculation (min) 38 min   ? Activity Tolerance Patient tolerated treatment well   ? Behavior During Therapy [facility] for tasks assessed/performed   ? ?  ?  ? ?  ? ? ?Past Medical History:  ?Diagnosis Date  ? Allergy   ? Arthritis   ? Cataract   ? Diabetes mellitus without complication ([place])   ? ED (erectile dysfunction)   ? GERD (gastroesophageal reflux disease)   ? Hx of adenomatous colonic polyps 07/15/2017  ? Hyperlipidemia   ? Hypertension   ? Normal nuclear stress test   ? myoview neg 12 09 except for HT  ? Other and unspecified alcohol dependence, unspecified drinking behavior   ? date of last use 02/13/11  ? Prostate cancer ([place]) 02/2017  ? stage t1c, low risk and asymptomatic followed at [facility]  ? Proteinuria   ? past record showed 528m protein excretion per 24 hours eval by renal  ? PTSD (post-traumatic stress disorder)   [place] ? S/P dilatation of esophageal stricture   [facility]  ? ? ?Past Surgical History:  ?Procedure Laterality Date  ? ls spinal surgery    ? NM MYOVIEW LTD  12/09  ? neg except for HT  ? PROSTATE BIOPSY  2018  ? stage t1c cancer  ? TRIGGER FINGER RELEASE Left 10/18/2016  ? Procedure: LEFT TRIGGER THUMB RELEASE;  Surgeon: TMilly Jakob MD;  Location[place] [facility]  Service: Orthopedics;  Laterality: Left;  ? ? ?There were  no vitals filed for this visit. ? ? Subjective Assessment - 06/22/21 0927   ? ? Subjective Pt with no new complaints, states only minor neck pain.   ? Pertinent History dementia, prostate cancer, PTSD   ? Patient Stated Goals reduce neck pain   ? Currently in Pain? Yes   ? Pain Score 2    ? Pain Location Neck   ? Pain Orientation Right   ? Pain Descriptors / Indicators Aching   ? ?  ?  ? ?  ? ? ? ? ? ? ? ? ? ? ? ? ? ? ? ? ? ? ? ? OSistersAdult PT Treatment/Exercise - 06/22/21 0001   ? ?  ? Neck Exercises: Machines for Strengthening  ? UBE (Upper Arm Bike) L 1.5 x3 min each way   ? Lat Pull 30# 2x10   ? Other Machines for Strengthening Resisted backwards walking 10# 2x5   ?  ? Neck Exercises: Theraband  ? Shoulder Extension 20 reps;Red   ? Rows 20 reps;Red   ? Shoulder External Rotation 20 reps;Red   ? Horizontal ADduction 20 reps;Red   ?  ? Neck Exercises: Standing  ? Wall Push Ups 20 reps   ?  ?  Neck Exercises: Seated  ? Neck Retraction 20 reps   ? Neck Retraction Limitations into ball   ? Shoulder Rolls Backwards;20 reps   ? Shoulder Flexion Both;20 reps   ? Shoulder Flexion Weights (lbs) 3   ? Shoulder ABduction Both;20 reps   ? Shoulder Abduction Weights (lbs) 3   ? Other Seated Exercise Overhead shoulder press and seated chest press 3# 2x10B   ?  ? Neck Exercises: Stretches  ? Other Neck Stretches seated ball behind back with thoracic extension stretch x20   ?  ? Manual Therapy  ? Manual Therapy Soft tissue mobilization;Myofascial release;Manual Traction   ? Soft tissue mobilization STM to cervical paraspinals and bilateral upper traps for muscular elongation.   ? Myofascial Release manual trigger point release to left paraspinals   ? Manual Traction manual traction 15 sec hold x2 reps   ? ?  ?  ? ?  ? ? ? ? ? ? ? ? ? ? ? ? PT Short Term Goals - 06/08/21 0930   ? ?  ? PT SHORT TERM GOAL #1  ? Title Patient will be independent with initial HEP for cervical ROM exercises   ? Status Achieved   ? ?  ?  ? ?  ? ? ? ? PT  Long Term Goals - 06/22/21 0930   ? ?  ? PT LONG TERM GOAL #1  ? Title Patient is independent with advanced HEP for cervical ROM and postural exercises   ? Status On-going   ?  ? PT LONG TERM GOAL #2  ? Title Patient is able to turn his head to the right to look behind himself while driving with pain level </=1/10   ? Status Partially Met   ?  ? PT LONG TERM GOAL #3  ? Title Patient is able to look to the right to see a person walking beside himself with increased cervical rotation >/= 75 degrees and pain level </= 1/10   ? Status On-going   ?  ? PT LONG TERM GOAL #4  ? Title Patient understands how to hold his head upward with decreased forward head due to increased in postural strength and awareness   ? Status On-going   ? ?  ?  ? ?  ? ? ? ? ? ? ? ? Plan - 06/22/21 0928   ? ? Clinical Impression Statement Jeremy Black continues to progress towards goal related activities and decreased overall pain.  Pt continues to perform exercises with his [place].  Pt with trigger point noted in R upper trap that was able to be released with manual therapy, pt reporting decreased pain to 0/10 following manual therapy.  Pt reports that he overall has made significant improvements since starting PT.  Will continue to require skilled PT to progres towards goal related activities and decreased overall pain.   ? Personal Factors and Comorbidities Age;Comorbidity 3+   ? Comorbidities Kidney transplant 2022; Hx prostate cancer; PTSD, dementia   ? PT Treatment/Interventions ADLs/Self Care Home Management;Cryotherapy;Electrical Stimulation;Moist Heat;Therapeutic activities;Therapeutic exercise;Neuromuscular re-education;Patient/family education;Manual techniques;Passive range of motion;Dry needling;Joint Manipulations;Traction   ? PT Next Visit Plan joint mobilization to cervical; possible dry needling to cervical muscles; cervical ROM exercises; UBE; postural strength   ? Consulted and Agree with Plan of Care Patient   ? ?  ?  ? ?   ? ? ?Patient will benefit from skilled therapeutic intervention in order to improve the following deficits and impairments:  Decreased range  of motion, Increased fascial restricitons, Pain, Decreased activity tolerance, Decreased mobility ? ?Visit Diagnosis: ?Cervicalgia ? ?Stiffness of cervical spine ? ? ? ? ?Problem List ?Patient Active Problem List  ? Diagnosis Date Noted  ? Hypotension 11/11/2017  ? ESRD (end stage renal disease) on dialysis ([place]) 11/11/2017  ? Hx of adenomatous colonic polyps 07/15/2017  ? Prostate cancer ([place]) 02/13/2017  ? Type 2 diabetes mellitus with diabetic nephropathy ([place]) 11/17/2013  ? Diabetes mellitus, type 2 ([place]) 09/14/2013  ? Leg cramps 02/23/2013  ? Other male sexual dysfunction 02/23/2013  ? CMC arthritis, thumb, degenerative 08/25/2012  ? Diabetes mellitus, new onset ([place]) 07/21/2012  ? Thumb pain 07/21/2012  ? SOB (shortness of breath) 06/30/2012  ? Bradycardia 06/30/2012  ? S/P dilatation of esophageal stricture   ? Renal insufficiency  cr 1.3 range 01/26/2012  ? Burning pain 01/21/2011  ? OBESITY 01/11/2010  ? OSTEOARTHRITIS, HAND 01/11/2010  ? TOBACCO USE, QUIT 06/29/2009  ? TRIGGER FINGER 10/31/2008  ? CHEST PAIN, ATYPICAL 03/15/2008  ? Proteinuria 11/19/2006  ? HYPERLIPIDEMIA 10/22/2006  ? ERECTILE DYSFUNCTION 10/22/2006  ? Essential hypertension 10/22/2006  ? DEGENERATION, DISC NOS 10/22/2006  ? INSOMNIA 10/22/2006  ? POSITIVE PPD 10/22/2006  ? ? ?Juel Burrow, PT ?06/22/2021, 9:34 AM ? ?[facility] ?[place] @ [place] ?[place], [place], [address] ?Phone: [phone]   Fax:  [phone] ? ?Name: Jeremy Black ?MRN: [id number] ?Date of Birth: 1946/01/15 ? ? ? ?

## 2021-06-29 ENCOUNTER — Ambulatory Visit: Payer: Medicare PPO | Admitting: Rehabilitative and Restorative Service Providers"

## 2021-06-29 ENCOUNTER — Encounter: Payer: Self-pay | Admitting: Rehabilitative and Restorative Service Providers"

## 2021-06-29 ENCOUNTER — Other Ambulatory Visit: Payer: Self-pay

## 2021-06-29 DIAGNOSIS — M436 Torticollis: Secondary | ICD-10-CM | POA: Diagnosis not present

## 2021-06-29 DIAGNOSIS — M542 Cervicalgia: Secondary | ICD-10-CM | POA: Diagnosis not present
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
## 2021-07-06 ENCOUNTER — Ambulatory Visit: Payer: Medicare PPO | Admitting: Rehabilitative and Restorative Service Providers"

## 2021-07-24 DIAGNOSIS — Z940 Kidney transplant status: Secondary | ICD-10-CM | POA: Diagnosis not present

## 2021-07-24 DIAGNOSIS — M25551 Pain in right hip: Secondary | ICD-10-CM | POA: Diagnosis not present

## 2021-07-24 DIAGNOSIS — R251 Tremor, unspecified: Secondary | ICD-10-CM | POA: Diagnosis not present

## 2021-07-24 DIAGNOSIS — D849 Immunodeficiency, unspecified: Secondary | ICD-10-CM | POA: Diagnosis not present

## 2021-07-24 DIAGNOSIS — R296 Repeated falls: Secondary | ICD-10-CM | POA: Diagnosis not present

## 2021-07-24 DIAGNOSIS — F32A Depression, unspecified: Secondary | ICD-10-CM | POA: Diagnosis not present

## 2021-07-24 DIAGNOSIS — Z4822 Encounter for aftercare following kidney transplant: Secondary | ICD-10-CM | POA: Diagnosis not present

## 2021-07-24 DIAGNOSIS — I10 Essential (primary) hypertension: Secondary | ICD-10-CM | POA: Diagnosis not present

## 2021-07-24 DIAGNOSIS — E785 Hyperlipidemia, unspecified: Secondary | ICD-10-CM | POA: Diagnosis not present

## 2021-07-24 DIAGNOSIS — E119 Type 2 diabetes mellitus without complications: Secondary | ICD-10-CM | POA: Diagnosis not present

## 2021-07-24 DIAGNOSIS — F0390 Unspecified dementia without behavioral disturbance: Secondary | ICD-10-CM | POA: Diagnosis not present

## 2021-09-11 DIAGNOSIS — Z940 Kidney transplant status: Secondary | ICD-10-CM | POA: Diagnosis not present

## 2021-09-11 DIAGNOSIS — N2889 Other specified disorders of kidney and ureter: Secondary | ICD-10-CM | POA: Diagnosis not present

## 2021-09-11 DIAGNOSIS — T8619 Other complication of kidney transplant: Secondary | ICD-10-CM | POA: Diagnosis not present

## 2021-09-11 DIAGNOSIS — R3129 Other microscopic hematuria: Secondary | ICD-10-CM | POA: Diagnosis not present

## 2021-09-13 DIAGNOSIS — F32A Depression, unspecified: Secondary | ICD-10-CM | POA: Diagnosis not present

## 2021-09-13 DIAGNOSIS — F4310 Post-traumatic stress disorder, unspecified: Secondary | ICD-10-CM | POA: Diagnosis not present

## 2021-09-13 DIAGNOSIS — R413 Other amnesia: Secondary | ICD-10-CM | POA: Diagnosis not present

## 2021-09-13 DIAGNOSIS — F1021 Alcohol dependence, in remission: Secondary | ICD-10-CM | POA: Diagnosis not present

## 2021-09-14 DIAGNOSIS — R800 Isolated proteinuria: Secondary | ICD-10-CM | POA: Diagnosis not present

## 2021-09-14 DIAGNOSIS — R399 Unspecified symptoms and signs involving the genitourinary system: Secondary | ICD-10-CM | POA: Diagnosis not present

## 2021-09-14 DIAGNOSIS — R3989 Other symptoms and signs involving the genitourinary system: Secondary | ICD-10-CM | POA: Diagnosis not present

## 2021-09-14 DIAGNOSIS — R3129 Other microscopic hematuria: Secondary | ICD-10-CM | POA: Diagnosis not present

## 2021-09-14 DIAGNOSIS — Z940 Kidney transplant status: Secondary | ICD-10-CM | POA: Diagnosis not present

## 2021-09-14 DIAGNOSIS — N186 End stage renal disease: Secondary | ICD-10-CM | POA: Diagnosis not present

## 2021-09-14 DIAGNOSIS — Z8546 Personal history of malignant neoplasm of prostate: Secondary | ICD-10-CM | POA: Diagnosis not present

## 2021-10-01 DIAGNOSIS — Z452 Encounter for adjustment and management of vascular access device: Secondary | ICD-10-CM | POA: Diagnosis not present

## 2021-10-01 DIAGNOSIS — R3129 Other microscopic hematuria: Secondary | ICD-10-CM | POA: Diagnosis not present

## 2021-10-01 DIAGNOSIS — N261 Atrophy of kidney (terminal): Secondary | ICD-10-CM | POA: Diagnosis not present

## 2021-10-01 DIAGNOSIS — Z940 Kidney transplant status: Secondary | ICD-10-CM | POA: Diagnosis not present

## 2021-10-04 DIAGNOSIS — R82998 Other abnormal findings in urine: Secondary | ICD-10-CM | POA: Diagnosis not present

## 2021-10-04 DIAGNOSIS — R800 Isolated proteinuria: Secondary | ICD-10-CM | POA: Diagnosis not present

## 2021-10-04 DIAGNOSIS — Z940 Kidney transplant status: Secondary | ICD-10-CM | POA: Diagnosis not present

## 2021-10-04 DIAGNOSIS — R310 Gross hematuria: Secondary | ICD-10-CM | POA: Diagnosis not present

## 2021-11-01 DIAGNOSIS — I10 Essential (primary) hypertension: Secondary | ICD-10-CM | POA: Diagnosis not present

## 2021-11-01 DIAGNOSIS — N400 Enlarged prostate without lower urinary tract symptoms: Secondary | ICD-10-CM | POA: Diagnosis not present

## 2021-11-01 DIAGNOSIS — I2510 Atherosclerotic heart disease of native coronary artery without angina pectoris: Secondary | ICD-10-CM | POA: Diagnosis not present

## 2021-11-01 DIAGNOSIS — F3341 Major depressive disorder, recurrent, in partial remission: Secondary | ICD-10-CM | POA: Diagnosis not present

## 2021-11-01 DIAGNOSIS — E1142 Type 2 diabetes mellitus with diabetic polyneuropathy: Secondary | ICD-10-CM | POA: Diagnosis not present

## 2021-11-01 DIAGNOSIS — N529 Male erectile dysfunction, unspecified: Secondary | ICD-10-CM | POA: Diagnosis not present

## 2021-11-01 DIAGNOSIS — Z5982 Transportation insecurity: Secondary | ICD-10-CM | POA: Diagnosis not present

## 2021-11-01 DIAGNOSIS — D84821 Immunodeficiency due to drugs: Secondary | ICD-10-CM | POA: Diagnosis not present

## 2021-11-01 DIAGNOSIS — Z940 Kidney transplant status: Secondary | ICD-10-CM | POA: Diagnosis not present

## 2021-11-01 DIAGNOSIS — J439 Emphysema, unspecified: Secondary | ICD-10-CM | POA: Diagnosis not present

## 2021-11-01 DIAGNOSIS — G621 Alcoholic polyneuropathy: Secondary | ICD-10-CM | POA: Diagnosis not present

## 2021-11-01 DIAGNOSIS — F0150 Vascular dementia without behavioral disturbance: Secondary | ICD-10-CM | POA: Diagnosis not present

## 2021-11-01 DIAGNOSIS — E669 Obesity, unspecified: Secondary | ICD-10-CM | POA: Diagnosis not present

## 2021-11-01 DIAGNOSIS — E1151 Type 2 diabetes mellitus with diabetic peripheral angiopathy without gangrene: Secondary | ICD-10-CM | POA: Diagnosis not present

## 2021-11-01 DIAGNOSIS — D8481 Immunodeficiency due to conditions classified elsewhere: Secondary | ICD-10-CM | POA: Diagnosis not present

## 2021-11-01 DIAGNOSIS — F1021 Alcohol dependence, in remission: Secondary | ICD-10-CM | POA: Diagnosis not present

## 2021-11-01 DIAGNOSIS — K219 Gastro-esophageal reflux disease without esophagitis: Secondary | ICD-10-CM | POA: Diagnosis not present

## 2021-11-01 DIAGNOSIS — E876 Hypokalemia: Secondary | ICD-10-CM | POA: Diagnosis not present

## 2021-11-06 ENCOUNTER — Telehealth: Payer: Self-pay

## 2021-11-06 ENCOUNTER — Ambulatory Visit: Payer: Self-pay

## 2021-11-06 NOTE — Patient Outreach (Signed)
  Care Management   Outreach Note  11/06/2021 Name: Jeremy Black MRN: [id number] DOB: 07/13/1945  An unsuccessful telephone outreach was attempted today. The patient was referred to the case management team for assistance with care management and care coordination.   Follow Up Plan:  The care management team will reach out to the patient again over the next 7 days.   Daneen Schick, BSW, CDP Social Worker, Certified Dementia Practitioner Care Coordination [phone]

## 2021-11-06 NOTE — Patient Outreach (Signed)
  Care Coordination   Initial Visit Note   11/06/2021 Name: Jeremy Black MRN: [id number] DOB: 04-04-1946  Jeremy Black is a 76 y.o. year old male who sees Panosh, Standley Brooking, MD for primary care. I  spoke with patients spouse and caregiver Baker Pierini by phone.  What matters to the patients health and wellness today?  Continue to eat well to gain weight; no falls   Goals Addressed               This Visit's Progress     Patient Stated     I want him to continue to eat and gain weight (pt-stated)        Care Coordination Interventions: Discussed patients wife provides limited assistance with ADL's and iADL's due to patients cognitive decline Determined the patients appetite has improved and he is no longer losing weight Discussed fall intervention strategies- patient has not fallen in approximately 4 months. Continues to use walker and/or wheelchair when ambulating Reviewed health plan benefit information  Determined the patient is not aware of the [employer] benefit Provided verbal and written education on what the benefit covers and how to access Scheduled follow up call over the next month to confirm receipt of mailed educational information        SDOH assessments and interventions completed:   Yes SDOH Interventions Today    Flowsheet Row Most Recent Value  SDOH Interventions   Food Insecurity Interventions Intervention Not Indicated  Housing Interventions Intervention Not Indicated  Transportation Interventions Intervention Not Indicated       Care Coordination Interventions Activated:  Yes Care Coordination Interventions:  Yes, provided  Follow up plan: Follow up call scheduled for 8/16  Encounter Outcome:  Pt. Visit Completed  Daneen Schick, BSW, CDP Social Worker, Certified Dementia Practitioner Care Coordination [phone]

## 2021-11-06 NOTE — Patient Instructions (Signed)
Visit Information  Thank you for taking time to visit with me today. Please don't hesitate to contact me if I can be of assistance to you.   Following are the goals we discussed today:   Goals Addressed               This Visit's Progress     Patient Stated     I want him to continue to eat and gain weight (pt-stated)        Care Coordination Interventions: Discussed patients wife provides limited assistance with ADL's and iADL's due to patients cognitive decline Determined the patients appetite has improved and he is no longer losing weight Discussed fall intervention strategies- patient has not fallen in approximately 4 months. Continues to use walker and/or wheelchair when ambulating Reviewed health plan benefit information  Determined the patient is not aware of the [place] 365 benefit Provided verbal and written education on what the benefit covers and how to access Scheduled follow up call over the next month to confirm receipt of mailed educational information        Our next appointment is by telephone on 8/16 at 11 am  Please call the care guide team at [phone] if you need to cancel or reschedule your appointment.   If you are experiencing a Mental Health or [place] or need someone to talk to, please call the Suicide and Crisis Lifeline: 988  Patient verbalizes understanding of instructions and care plan provided today and agrees to view in [place]. Active MyChart status and patient understanding of how to access instructions and care plan via MyChart confirmed with patient.     Telephone follow up appointment with care management team member scheduled for:8/18  Daneen Schick, BSW, CDP Social Worker, Certified Dementia Practitioner Care Coordination [phone]

## 2021-11-12 NOTE — Progress Notes (Unsigned)
No chief complaint on file.   HPI: Jeremy Black 76 y.o. come in with wife today with form to renew to be able to go to [employer] [facility] [facility].  He has been attending it in the past year but looking for scholarship previously paid for by the [place].  It has been helpful for him for socialization doing certain things.  3 days a week.  This is for people with dementia to get help. He has a number of specialist at the [facility] to include urologist neurologist cardiac renal podiatry and psychology.  He is under more evaluation because his right leg is having tremors and may be a little bit more weak His balance is on even and he uses a walker although he did not bring it with him today and fortunately has not had any falls. His wife states that his short-term memory is is off still but he is Scientist, forensic.  He has some symptoms of depressions because he "cannot do anything and used to do" but attending the daycare has been helpful for him Dat[employer]   scan is ordered in the near future.  Under evaluation rule out parkinsonian syndrome and has been taken off statin medicine considering that some of his lower extremity problem and gait problem is myopathy.   ROS: See pertinent positives and negatives per HPI.  No current chest pain shortness of breath he does have bad dreams but that has been ongoing.  Past Medical History:  Diagnosis Date   Allergy    Arthritis    Cataract    Diabetes mellitus without complication (HCC)    ED (erectile dysfunction)    GERD (gastroesophageal reflux disease)    Hx of adenomatous colonic polyps 07/15/2017   Hyperlipidemia    Hypertension    Normal nuclear stress test    myoview neg 12 09 except for HT   Other and unspecified alcohol dependence, unspecified drinking behavior    date of last use 02/13/11   Prostate cancer ([place]) 02/2017   stage t1c, low risk and asymptomatic followed at [facility]   Proteinuria    past record showed 562m protein excretion per 24 hours  eval by renal   PTSD (post-[place] stress disorder)    [place]  [facility]    Family History  Problem Relation Age of Onset   Diabetes Mother    Hypertension Mother    Other Mother        nerves   Throat cancer Father        deceased   Coronary artery disease Sister    Diabetes Sister    Heart disease Sister    Hypertension Brother    Alcohol abuse Paternal Aunt    Alcohol abuse Cousin    Drug abuse Son        cocaine dependent    Social History   Socioeconomic History   Marital status: Legally Separated    Spouse name: Not on file   Number of children: Not on file   Years of education: Not on file   Highest education level: Not on file  Occupational History   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   Working with disabled adults.    Playedgolf  Hunting in past          Social Determinants of Health   Financial Resource Strain: Low Risk  (05/03/2021)   Overall Financial Resource Strain (CARDIA)    Difficulty of Paying  Living Expenses: Not hard at all  Food Insecurity: No Food Insecurity (11/06/2021)   Hunger Vital Sign    Worried About Running Out of Food in the Last Year: Never true    Ran Out of Food in the Last Year: Never true  Transportation Needs: No Transportation Needs (11/06/2021)   PRAPARE - Hydrologist (Medical): No    Lack of Transportation (Non-Medical): No  Physical Activity: Insufficiently Active (05/03/2021)   Exercise Vital Sign    Days of Exercise per Week: 3 days    Minutes of Exercise per Session: 30 min  Stress: No Stress Concern Present (05/03/2021)   [place]    Feeling of Stress : Not at all  Social Connections: [place] (05/03/2021)   Social Connection and Isolation Panel [NHANES]    Frequency of Communication with Friends and Family: More than three times a week    Frequency of Social Gatherings with Friends and Family: More than three times a week    Attends Religious Services: More than 4 times per year    Active Member of Clubs or Organizations: Yes    Attends Music therapist: More than 4 times per year    Marital Status: Married    Outpatient Medications Prior to Visit  Medication Sig Dispense Refill   amLODipine (NORVASC) 5 MG tablet Take 1 tablet by mouth daily.     aspirin (ASPIRIN 81) 81 MG chewable tablet 1 tablet Orally Once a day     azaTHIOprine (IMURAN) 50 MG tablet Take 3 tablets by mouth daily.     carvedilol (COREG) 3.125 MG tablet Take 3.125 mg by mouth 2 (two) times daily.     donepezil (ARICEPT) 5 MG tablet TAKE ONE TABLET BY MOUTH AS DIRECTED BY YOUR MEDICAL PROVIDER -  TAKE ONE TABLET BY MOUTH AT BEDTIME FOR 14 DAYS THEN TAKE TWO TABLETS BY  MOUTH AT BEDTIME FOR 14 DAYS THEN TAKE THREE TABLETS BY MOUTH AT BEDTIME  FOR 14 DAYS THEN TAKE FOUR TABLETS BY MOUTH AT BEDTIME -   TAKE ONE TABLET BY MOUTH AT BEDTIME FOR 14 DAYS THEN TAKE TWO  TABLETS BY  MOUTH AT BEDTIME FOR 14 DAYS THEN TAKE THREE TABLETS BY MOUTH AT BEDTIME  FOR 14 DAYS THEN TAKE FOUR TABLETS BY MOUTH AT BEDTIME     folic acid-vitamin b complex-vitamin c-selenium-zinc (DIALYVITE) 3 MG TABS tablet Take 1 tablet by mouth daily.     memantine (NAMENDA) 10 MG tablet Take by mouth. Take 1/2 tab x 2 weeks, then 1 tab x 2 weeks, then 1.5 tab x 2 weeks then 2 tab daily to continue.     mirtazapine (REMERON SOL-TAB) 15 MG disintegrating tablet Take 15 mg by mouth at bedtime.     Multiple Vitamin (MULTIVITAMIN) tablet Take 1 tablet by mouth daily.     omeprazole (PRILOSEC) 20 MG capsule Take 1 capsule by mouth daily.     potassium chloride (KLOR-CON) 10 MEQ tablet Take 10 mEq by mouth 2 (two) times daily.     predniSONE (DELTASONE) 5 MG tablet Take 1 tablet by mouth daily.     sulfamethoxazole-trimethoprim (BACTRIM) 400-80 MG tablet Take by mouth 3 (three) times a week.     tacrolimus (PROGRAF) 1 MG capsule Take by mouth as directed.     tamsulosin (FLOMAX) 0.4 MG CAPS capsule Take 0.4 mg by mouth.     traZODone (DESYREL) 50 MG tablet Take 25 mg by mouth at bedtime as needed for sleep.      benzonatate (TESSALON PERLES) 100 MG capsule 1-2 capsules up to twice daily as needed for cough (Patient not taking: Reported on 11/13/2021) 40 capsule 0   furosemide (LASIX) 20 MG tablet Take by mouth as directed. (Patient not taking: Reported on 11/13/2021)     ipratropium (ATROVENT) 0.03 % nasal spray Place 2 sprays into both nostrils as needed. (Patient not taking: Reported on 11/13/2021)     Megestrol Acetate (MEGACE ORAL PO) Take by mouth. (Patient not taking: Reported on 11/13/2021)     PEG-KCl-NaCl-NaSulf-Na Asc-C (PLENVU) 140 g SOLR Take 1 kit by mouth as directed. (Patient not taking: Reported on 11/13/2021) 1 each 0   No facility-administered medications prior to visit.     EXAM:  BP 130/70 (BP Location: Right Arm, Cuff Size: Large)   Pulse 62   Temp 98.7 F (37.1 C) (Oral)   Wt  218 lb 9.6 oz (99.2 kg)   SpO2 97%   BMI 30.49 kg/m   Body mass index is 30.49 kg/m.  GENERAL: vitals reviewed and listed above, alert, personable appears well hydrated and in no acute distress normal conversation HEENT: atraumatic, conjunctiva  clear, no obvious abnormalities on inspection of external nose and ears NECK: no obvious masses on inspection palpation  LUNGS: clear to auscultation bilaterally, no wheezes, rales or rhonchi, good air movement CV: HRRR, no clubbing cyanosis onl cap refill  MS: moves all extremities without noticeable focal  abnormality gait is unsteady halting gait with flat feet. PSYCH: pleasant and cooperative,   BP Readings from Last 3 Encounters:  11/13/21 130/70  05/09/21 (!) 168/70  05/03/21 140/62   See note from [facility] neurology  and last labs ASSESSMENT AND PLAN:  Discussed the following assessment and plan:  Unsteady gait  Memory deficit  Dementia, unspecified dementia severity, unspecified dementia type, unspecified whether behavioral, psychotic, or mood disturbance or anxiety ([place])  S/p cadaver renal transplant  Mobility impaired  Hypertension, unspecified type Form and med list attached filled out given to patient and wife.  Documentation of his memory dementia issues and list of medical problems Encouraged to use his walker no matter where he goes that will open up opportunities for him and he agrees that is not sure  why he did not bring it today.  They state that his elevation of blood pressure today is actually good for him tends to be in that range.  He is followed by specialist.  And [facility]. -Patient advised to return or notify health care team  if  new concerns arise. I interim  35 minutes  review evaluate  report   .  There are no Patient Instructions on file for this visit.   Standley Brooking. Earma Nicolaou M.D.

## 2021-11-13 ENCOUNTER — Encounter: Payer: Self-pay | Admitting: Internal Medicine

## 2021-11-13 ENCOUNTER — Ambulatory Visit: Payer: Medicare PPO | Admitting: Internal Medicine

## 2021-11-13 VITALS — BP 130/70 | HR 62 | Temp 98.7°F | Wt 218.6 lb

## 2021-11-13 DIAGNOSIS — F0390 Unspecified dementia without behavioral disturbance: Secondary | ICD-10-CM | POA: Diagnosis not present

## 2021-11-13 DIAGNOSIS — Z7409 Other reduced mobility: Secondary | ICD-10-CM

## 2021-11-13 DIAGNOSIS — Z940 Kidney transplant status: Secondary | ICD-10-CM

## 2021-11-13 DIAGNOSIS — R2681 Unsteadiness on feet: Secondary | ICD-10-CM | POA: Diagnosis not present

## 2021-11-13 DIAGNOSIS — R413 Other amnesia: Secondary | ICD-10-CM

## 2021-11-13 DIAGNOSIS — I10 Essential (primary) hypertension: Secondary | ICD-10-CM

## 2021-11-28 ENCOUNTER — Ambulatory Visit: Payer: Self-pay

## 2021-11-28 NOTE — Patient Outreach (Signed)
  Care Coordination   Follow Up Visit Note   11/28/2021 Name: RONDALE NIES MRN: [id number] DOB: 1945/09/19  JAESON MOLSTAD is a 76 y.o. year old male who sees Panosh, Standley Brooking, MD for primary care. I  spoke with patients spouse Ruby by phone today.  What matters to the patients health and wellness today?  To learn more about my health plan benefit.    Goals Addressed               This Visit's Progress     Patient Stated     I want him to continue to eat and gain weight (pt-stated)        Care Coordination Interventions: Determined patient has yet to receive mailed resource information Reviewed [phone] benefit telephonically Scheduled follow up call over the next two weeks to confirm receipt of mailed resource        SDOH assessments and interventions completed:  No     Care Coordination Interventions Activated:  Yes  Care Coordination Interventions:  Yes, provided   Follow up plan: Follow up call scheduled for 8/30    Encounter Outcome:  Pt. Visit Completed   Daneen Schick, BSW, CDP Social Worker, Certified Dementia Practitioner Care Coordination [phone]

## 2021-11-28 NOTE — Patient Instructions (Signed)
Visit Information  Thank you for taking time to visit with me today. Please don't hesitate to contact me if I can be of assistance to you.   Following are the goals we discussed today:   Goals Addressed               This Visit's Progress     Patient Stated     I want him to continue to eat and gain weight (pt-stated)        Care Coordination Interventions: Determined patient has yet to receive mailed resource information Reviewed [phone] benefit telephonically Scheduled follow up call over the next two weeks to confirm receipt of mailed resource        Our next appointment is by telephone on 8/30 at 11:00  Please call the care guide team at [phone] if you need to cancel or reschedule your appointment.   If you are experiencing a Mental Health or [place] or need someone to talk to, please go to [facility] [address] [place] [phone])  Patient verbalizes understanding of instructions and care plan provided today and agrees to view in [place]. Active MyChart status and patient understanding of how to access instructions and care plan via MyChart confirmed with patient.     Telephone follow up appointment with care management team member scheduled for:8/30  Daneen Schick, BSW, CDP Social Worker, Certified Dementia Practitioner Care Coordination [phone]

## 2021-12-12 ENCOUNTER — Ambulatory Visit: Payer: Self-pay

## 2021-12-12 NOTE — Patient Outreach (Signed)
  Care Coordination   Follow Up Visit Note   12/12/2021 Name: Jeremy Black MRN: [id number] DOB: 1945/12/24  [name] is a 76 y.o. year old male who sees Panosh, Standley Brooking, MD for primary care. I  spoke with patients spouse and caregiver Jeremy Black by phone today.  What matters to the patients health and wellness today?  Doing well at this time    Goals Addressed               This Visit's Progress     Patient Stated     COMPLETED: I want him to continue to eat and gain weight (pt-stated)        Care Coordination Interventions: Determined patient has yet to receive mailed resource information Education sent via e-mail to Baker Pierini at [email or web address] Discussed the patient is doing well at this time Encouraged Mrs. Witter to contact SW as needed        SDOH assessments and interventions completed:  No     Care Coordination Interventions Activated:  Yes  Care Coordination Interventions:  Yes, provided   Follow up plan: No further intervention required.   Encounter Outcome:  Pt. Visit Completed   Daneen Schick, BSW, CDP Social Worker, Certified Dementia Practitioner Care Coordination [phone]

## 2021-12-12 NOTE — Patient Outreach (Signed)
  Care Coordination   12/12/2021 Name: Jeremy Black MRN: [id number] DOB: 1945-12-12   Care Coordination Outreach Attempts:  An unsuccessful telephone outreach was attempted today to offer the patient information about available care coordination services as a benefit of their health plan.   Follow Up Plan:  Additional outreach attempts will be made to offer the patient care coordination information and services.   Encounter Outcome:  No Answer  Care Coordination Interventions Activated:  No   Care Coordination Interventions:  No, not indicated    Daneen Schick, BSW, CDP Social Worker, Certified Dementia Practitioner Care Coordination [phone]

## 2021-12-12 NOTE — Patient Instructions (Signed)
Visit Information  Thank you for taking time to visit with me today. Please don't hesitate to contact me if I can be of assistance to you.   Following are the goals we discussed today:   Goals Addressed               This Visit's Progress     Patient Stated     COMPLETED: I want him to continue to eat and gain weight (pt-stated)        Care Coordination Interventions: Determined patient has yet to receive mailed resource information Education sent via e-mail to Baker Pierini at [email or web address] Discussed the patient is doing well at this time Encouraged Mrs. Mcnulty to contact SW as needed        Please call the care guide team at [phone] if you need to schedule an appointment with me.  If you are experiencing a Mental Health or [place] or need someone to talk to, please go to [facility] [address], [place] [phone])  Patient verbalizes understanding of instructions and care plan provided today and agrees to view in [place]. Active MyChart status and patient understanding of how to access instructions and care plan via MyChart confirmed with patient.     No further follow up required: Please contact me as needed.  Daneen Schick, BSW, CDP Social Worker, Certified Dementia Practitioner Care Coordination [phone]

## 2021-12-31 DIAGNOSIS — F32A Depression, unspecified: Secondary | ICD-10-CM | POA: Diagnosis not present

## 2021-12-31 DIAGNOSIS — F4310 Post-traumatic stress disorder, unspecified: Secondary | ICD-10-CM | POA: Diagnosis not present

## 2022-03-27 DIAGNOSIS — R3129 Other microscopic hematuria: Secondary | ICD-10-CM | POA: Diagnosis not present

## 2022-03-27 DIAGNOSIS — Z8546 Personal history of malignant neoplasm of prostate: Secondary | ICD-10-CM | POA: Diagnosis not present

## 2022-03-27 DIAGNOSIS — N430 Encysted hydrocele: Secondary | ICD-10-CM | POA: Diagnosis not present

## 2022-03-27 DIAGNOSIS — R3989 Other symptoms and signs involving the genitourinary system: Secondary | ICD-10-CM | POA: Diagnosis not present

## 2022-03-28 ENCOUNTER — Other Ambulatory Visit: Payer: Self-pay

## 2022-03-28 ENCOUNTER — Encounter: Payer: Self-pay | Admitting: Emergency Medicine

## 2022-03-28 ENCOUNTER — Ambulatory Visit
Admission: EM | Admit: 2022-03-28 | Discharge: 2022-03-28 | Disposition: A | Discharge status: Home / Self Care | Payer: No Typology Code available for payment source | Attending: Physician Assistant | Admitting: Physician Assistant

## 2022-03-28 DIAGNOSIS — M545 Low back pain, unspecified: Secondary | ICD-10-CM

## 2022-03-28 MED ORDER — TIZANIDINE HCL 4 MG PO TABS: 4.0000 mg | ORAL_TABLET | Freq: Four times a day (QID) | ORAL | 0 refills | Status: AC | PRN

## 2022-03-28 MED ORDER — PREDNISONE 20 MG PO TABS: 40.0000 mg | ORAL_TABLET | Freq: Every day | ORAL | 0 refills | Status: AC

## 2022-03-28 NOTE — ED Triage Notes (Signed)
Pt here for left lower back pain x 4 days since picking up some water; denies radiation down leg

## 2022-03-28 NOTE — ED Provider Notes (Signed)
[facility]    CSN: [id number] Arrival date & time: 03/28/22  1154      History   Chief Complaint Chief Complaint  Patient presents with   Back Pain    HPI Jeremy Black is a 76 y.o. male.   Patient here today for evaluation of left lower back pain that started 4 days ago after he picked up some water.  He reports that pain does not radiate.  He denies any other injury that he is aware of.  He has not had any numbness or tingling.  He has been taking Tylenol without resolution.  Movement seems to worsen pain.  The history is provided by the patient.  Back Pain Associated symptoms: no fever and no numbness     Past Medical History:  Diagnosis Date   Allergy    Arthritis    Cataract    Diabetes mellitus without complication ([place])    ED (erectile dysfunction)    GERD (gastroesophageal reflux disease)    Hx of adenomatous colonic polyps 07/15/2017   Hyperlipidemia    Hypertension    Normal nuclear stress test    myoview neg 12 09 except for HT   Other and unspecified alcohol dependence, unspecified drinking behavior    date of last use 02/13/11   Prostate cancer ([place]) 02/2017   stage t1c, low risk and asymptomatic followed at [facility]   Proteinuria    past record showed '500mg'$  protein excretion per 24 hours eval by renal   PTSD (post-traumatic stress disorder)    from [place] experience [place]   [facility]    [facility] Active Problem List   Diagnosis Date Noted   Hypotension 11/11/2017   ESRD (end stage renal disease) on dialysis ([place]) 11/11/2017   Hx of adenomatous colonic polyps 07/15/2017   Prostate cancer ([place]) 02/13/2017   Type 2 diabetes mellitus with diabetic nephropathy ([place]) 11/17/2013   Diabetes mellitus, type 2 ([place]) 09/14/2013   Leg cramps 02/23/2013   Other male sexual dysfunction 02/23/2013   CMC arthritis, thumb, degenerative 08/25/2012   Diabetes mellitus, new onset ([place]) 07/21/2012   Thumb pain  07/21/2012   SOB (shortness of breath) 06/30/2012   Bradycardia 06/30/2012   S/P dilatation of esophageal stricture    Renal insufficiency  cr 1.3 range 01/26/2012   Burning pain 01/21/2011   OBESITY 01/11/2010   OSTEOARTHRITIS, HAND 01/11/2010   TOBACCO USE, QUIT 06/29/2009   TRIGGER FINGER 10/31/2008   CHEST PAIN, ATYPICAL 03/15/2008   Proteinuria 11/19/2006   HYPERLIPIDEMIA 10/22/2006   ERECTILE DYSFUNCTION 10/22/2006   Essential hypertension 10/22/2006   DEGENERATION, DISC NOS 10/22/2006   INSOMNIA 10/22/2006   POSITIVE PPD 10/22/2006    Past Surgical History:  Procedure Laterality Date   ls spinal surgery     NM MYOVIEW LTD  12/09   neg except for HT   PROSTATE BIOPSY  2018   stage t1c cancer   TRIGGER FINGER RELEASE Left 10/18/2016   Procedure: LEFT TRIGGER THUMB RELEASE;  Surgeon: Milly Jakob, MD;  Location: [facility];  Service: Orthopedics;  Laterality: Left;       Home Medications    Prior to Admission medications   Medication Sig Start Date End Date Taking? Authorizing Provider  predniSONE (DELTASONE) 20 MG tablet Take 2 tablets (40 mg total) by mouth daily with breakfast for 5 days. 03/28/22 04/02/22 Yes Francene Finders, PA-C  tiZANidine (ZANAFLEX) 4 MG tablet  Take 1 tablet (4 mg total) by mouth every 6 (six) hours as needed for muscle spasms. 03/28/22  Yes Francene Finders, PA-C  amLODipine (NORVASC) 5 MG tablet Take 1 tablet by mouth daily. 06/26/20   [provider]  aspirin (ASPIRIN 81) 81 MG chewable tablet 1 tablet Orally Once a day    [provider]  azaTHIOprine (IMURAN) 50 MG tablet Take 3 tablets by mouth daily. 11/08/20   [provider]  carvedilol (COREG) 3.125 MG tablet Take 3.125 mg by mouth 2 (two) times daily. 02/15/21   [provider]  donepezil (ARICEPT) 5 MG tablet TAKE ONE TABLET BY MOUTH AS DIRECTED BY YOUR MEDICAL PROVIDER -  TAKE ONE TABLET BY MOUTH AT BEDTIME FOR 14 DAYS THEN TAKE TWO  TABLETS BY  MOUTH AT BEDTIME FOR 14 DAYS THEN TAKE THREE TABLETS BY MOUTH AT BEDTIME  FOR 14 DAYS THEN TAKE FOUR TABLETS BY MOUTH AT BEDTIME -   TAKE ONE TABLET BY MOUTH AT BEDTIME FOR 14 DAYS THEN TAKE TWO TABLETS BY  MOUTH AT BEDTIME FOR 14 DAYS THEN TAKE THREE TABLETS BY MOUTH AT BEDTIME  FOR 14 DAYS THEN TAKE FOUR TABLETS BY MOUTH AT BEDTIME 07/12/20   [provider]  folic acid-vitamin b complex-vitamin c-selenium-zinc (DIALYVITE) 3 MG TABS tablet Take 1 tablet by mouth daily.    [provider]  memantine (NAMENDA) 10 MG tablet Take by mouth. Take 1/2 tab x 2 weeks, then 1 tab x 2 weeks, then 1.5 tab x 2 weeks then 2 tab daily to continue.    [provider]  mirtazapine (REMERON SOL-TAB) 15 MG disintegrating tablet Take 15 mg by mouth at bedtime.    [provider]  Multiple Vitamin (MULTIVITAMIN) tablet Take 1 tablet by mouth daily.    [provider]  omeprazole (PRILOSEC) 20 MG capsule Take 1 capsule by mouth daily. 07/12/20   [provider]  potassium chloride (KLOR-CON) 10 MEQ tablet Take 10 mEq by mouth 2 (two) times daily.    [provider]  sulfamethoxazole-trimethoprim (BACTRIM) 400-80 MG tablet Take by mouth 3 (three) times a week. 08/04/20   [provider]  tacrolimus (PROGRAF) 1 MG capsule Take by mouth as directed. 12/21/20   [provider]  tamsulosin (FLOMAX) 0.4 MG CAPS capsule Take 0.4 mg by mouth.    [provider]  traZODone (DESYREL) 50 MG tablet Take 25 mg by mouth at bedtime as needed for sleep.     [provider]    Family History Family History  Problem Relation Age of Onset   Diabetes Mother    Hypertension Mother    Other Mother        nerves   Throat cancer Father        deceased   Coronary artery disease Sister    Diabetes Sister    Heart disease Sister    Hypertension Brother    Alcohol abuse Paternal Aunt    Alcohol abuse Cousin    Drug abuse Son         cocaine dependent    Social History Social History   Tobacco Use   Smoking status: Former    Packs/day: 0.50    Years: 40.00    Total pack years: 20.00    Types: Cigarettes    Quit date: 04/15/2009    Years since quitting: 12.9   Smokeless tobacco: Never  Vaping Use   Vaping Use: Never used  Substance Use  Topics   Alcohol use: Yes    Alcohol/week: 50.0 standard drinks of alcohol    Types: 50 Shots of liquor per week    Comment: Pt in treatment for alcohol dependence. Last date of use 02/13/11.,  Very very little 01/27/13   Drug use: No     Allergies   Codeine phosphate and Lisinopril   Review of Systems Review of Systems  Constitutional:  Negative for chills and fever.  Eyes:  Negative for discharge and redness.  Respiratory:  Negative for shortness of breath.   Musculoskeletal:  Positive for back pain and myalgias.  Neurological:  Negative for numbness.     Physical Exam Triage Vital Signs ED Triage Vitals  Enc Vitals Group     BP 03/28/22 1330 (!) 163/77     Pulse Rate 03/28/22 1330 63     Resp 03/28/22 1330 18     Temp 03/28/22 1330 97.6 F (36.4 C)     Temp Source 03/28/22 1330 Oral     SpO2 03/28/22 1330 95 %     Weight --      Height --      Head Circumference --      Peak Flow --      Pain Score 03/28/22 1331 6     Pain Loc --      Pain Edu? --      Excl. in [place]? --    No data found.  Updated Vital Signs BP (!) 163/77 (BP Location: Left Arm)   Pulse 63   Temp 97.6 F (36.4 C) (Oral)   Resp 18   SpO2 95%      Physical Exam Vitals and nursing note reviewed.  Constitutional:      General: He is not in acute distress.    Appearance: Normal appearance. He is not ill-appearing.  HENT:     Head: Normocephalic and atraumatic.  Eyes:     Conjunctiva/sclera: Conjunctivae normal.  Cardiovascular:     Rate and Rhythm: Normal rate.  Pulmonary:     Effort: Pulmonary effort is normal. No respiratory distress.  Musculoskeletal:     Comments:  No tenderness to palpation to midline thoracic or lumbar spine.  Mild tenderness to palpation noted to the low left-sided back, position change seems to worsen pain  Neurological:     Mental Status: He is alert.  Psychiatric:        Mood and Affect: Mood normal.        Behavior: Behavior normal.        Thought Content: Thought content normal.      UC Treatments / Results  Labs (all labs ordered are listed, but only abnormal results are displayed) Labs Reviewed - No data to display  EKG   Radiology No results found.  Procedures Procedures (including critical care time)  Medications Ordered in UC Medications - No data to display  Initial Impression / Assessment and Plan / UC Course  I have reviewed the triage vital signs and the nursing notes.  Pertinent labs & imaging results that were available during my care of the patient were reviewed by me and considered in my medical decision making (see chart for details).    Suspect muscle strain and will treat with steroid burst and tizanidine.  Discussed that muscle relaxer may cause drowsiness and recommended using half dose initially to determine lowest effective dose.  Wife who is with patient expresses understanding.  Encouraged follow-up with any further concerns.  Final Clinical  Impressions(s) / UC Diagnoses   Final diagnoses:  Acute left-sided low back pain without sciatica   Discharge Instructions   None    ED Prescriptions     Medication Sig Dispense Auth. Provider   predniSONE (DELTASONE) 20 MG tablet Take 2 tablets (40 mg total) by mouth daily with breakfast for 5 days. 10 tablet Ewell Poe F, PA-C   tiZANidine (ZANAFLEX) 4 MG tablet Take 1 tablet (4 mg total) by mouth every 6 (six) hours as needed for muscle spasms. 30 tablet Francene Finders, PA-C      PDMP not reviewed this encounter.   Francene Finders, PA-C 03/28/22 [phone]

## 2022-04-12 DIAGNOSIS — J189 Pneumonia, unspecified organism: Secondary | ICD-10-CM | POA: Diagnosis not present

## 2022-04-12 DIAGNOSIS — R051 Acute cough: Secondary | ICD-10-CM | POA: Diagnosis not present

## 2022-04-16 ENCOUNTER — Ambulatory Visit (INDEPENDENT_AMBULATORY_CARE_PROVIDER_SITE_OTHER): Payer: Medicare PPO | Admitting: Family

## 2022-04-16 VITALS — BP 154/86 | HR 63 | Temp 97.6°F | Wt 206.6 lb

## 2022-04-16 DIAGNOSIS — J189 Pneumonia, unspecified organism: Secondary | ICD-10-CM

## 2022-04-16 NOTE — Patient Instructions (Signed)
Return Jan 29th for a repeat check xray to be sure the Pneumonia has cleared. Then surgery may be considered.

## 2022-04-17 ENCOUNTER — Telehealth: Payer: Self-pay

## 2022-04-17 NOTE — Progress Notes (Signed)
Acute Office Visit  Subjective:     Patient ID: Jeremy Black, male    DOB: February 04, 1946, 77 y.o.   MRN: [id number]  Chief Complaint  Patient presents with   Follow-up    Pt reports he had to see [facility] 12/29 for a cough, got dx with pneumonia. Given doxycycline for 10 days, started on Friday. Pt reports his cough is better.     HPI Patient is in today as a follow-up of PNA that was diagnosed at [facility] on 12/29. He is currently on Doxycycline for 10 days. Patient reports that the cough is better and is about 70% better.   Review of Systems  Respiratory:  Positive for cough.   All other systems reviewed and are negative.  Past Medical History:  Diagnosis Date   Allergy    Arthritis    Cataract    Diabetes mellitus without complication (HCC)    ED (erectile dysfunction)    GERD (gastroesophageal reflux disease)    Hx of adenomatous colonic polyps 07/15/2017   Hyperlipidemia    Hypertension    Normal nuclear stress test    myoview neg 12 09 except for HT   Other and unspecified alcohol dependence, unspecified drinking behavior    date of last use 02/13/11   Prostate cancer ([place]) 02/2017   stage t1c, low risk and asymptomatic followed at [facility]   Proteinuria    past record showed '500mg'$  protein excretion per 24 hours eval by renal   PTSD (post-traumatic stress disorder)    from [place] experience [place]   [facility]    Social History   Socioeconomic History   Marital status: Legally Separated    Spouse name: Not on file   Number of children: Not on file   Years of education: Not on file   Highest education level: Not on file  Occupational History   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   Working with disabled adults.    Playedgolf  Hunting in past          Social Determinants of Health   Financial Resource Strain: Low Risk  (05/03/2021)   Overall Financial Resource Strain (CARDIA)    Difficulty of Paying Living Expenses: Not hard at all  Food  Insecurity: No Food Insecurity (11/06/2021)   Hunger Vital Sign    Worried About Running Out of Food in the Last Year: Never true    Ran Out of Food in the Last Year: Never true  Transportation Needs: No Transportation Needs (11/06/2021)   PRAPARE - Hydrologist (Medical): No    Lack of Transportation (Non-Medical): No  Physical Activity: Insufficiently Active (05/03/2021)   Exercise Vital Sign    Days of Exercise per Week: 3 days    Minutes of Exercise per Session: 30 min  Stress: No Stress Concern Present (05/03/2021)   [place]    Feeling of Stress : Not at all  Social Connections: [place] (05/03/2021)   Social Connection and Isolation Panel [NHANES]    Frequency of Communication with Friends and Family: More than three times a week    Frequency of Social Gatherings with Friends and Family: More than three times a week    Attends Religious Services: More  than 4 times per year    Active Member of [employer] or Organizations: Yes    Attends Archivist Meetings: More than 4 times per year    Marital Status: Married  Human resources officer Violence: Not At Risk (05/03/2021)   Humiliation, Afraid, Rape, and Kick questionnaire    Fear of Current or Ex-Partner: No    Emotionally Abused: No    Physically Abused: No    Sexually Abused: No    Past Surgical History:  Procedure Laterality Date   ls spinal surgery     NM MYOVIEW LTD  12/09   neg except for HT   PROSTATE BIOPSY  2018   stage t1c cancer   TRIGGER FINGER RELEASE Left 10/18/2016   Procedure: LEFT TRIGGER THUMB RELEASE;  Surgeon: Milly Jakob, MD;  Location: [facility];  Service: Orthopedics;  Laterality: Left;    Family History  Problem Relation Age of Onset   Diabetes Mother    Hypertension Mother    Other Mother        nerves   Throat cancer Father        deceased   Coronary artery disease Sister    Diabetes Sister    Heart disease Sister    Hypertension Brother    Alcohol abuse Paternal Aunt    Alcohol abuse Cousin    Drug abuse Son        cocaine dependent    Allergies  Allergen Reactions   Codeine Phosphate     Pt unsure of reaction    Lisinopril Cough    Current Outpatient Medications on File Prior to Visit  Medication Sig Dispense Refill   amLODipine (NORVASC) 5 MG tablet Take 1 tablet by mouth daily.     aspirin (ASPIRIN 81) 81 MG chewable tablet 1 tablet Orally Once a day     azaTHIOprine (IMURAN) 50 MG tablet Take 3 tablets by mouth daily.     carvedilol (COREG) 3.125 MG tablet Take 3.125 mg by mouth 2 (two) times daily.     donepezil (ARICEPT) 5 MG tablet TAKE ONE TABLET BY MOUTH AS DIRECTED BY YOUR MEDICAL PROVIDER -  TAKE ONE TABLET BY MOUTH AT BEDTIME FOR 14 DAYS THEN TAKE TWO TABLETS BY  MOUTH AT BEDTIME FOR 14 DAYS THEN TAKE THREE TABLETS BY MOUTH AT BEDTIME  FOR 14 DAYS THEN TAKE FOUR TABLETS BY MOUTH AT BEDTIME -   TAKE ONE TABLET  BY MOUTH AT BEDTIME FOR 14 DAYS THEN TAKE TWO TABLETS BY  MOUTH AT BEDTIME FOR 14 DAYS THEN TAKE THREE TABLETS BY MOUTH AT BEDTIME  FOR 14 DAYS THEN TAKE FOUR TABLETS BY MOUTH AT BEDTIME     doxycycline (VIBRA-TABS) 100 MG tablet Take 100 mg by mouth 2 (two) times daily.     folic acid-vitamin b complex-vitamin c-selenium-zinc (DIALYVITE) 3 MG TABS tablet Take 1 tablet by mouth daily.     memantine (NAMENDA) 10 MG tablet Take by mouth. Take 1/2 tab x 2 weeks, then 1 tab x 2 weeks, then 1.5 tab x 2 weeks then 2 tab daily to continue.     mirtazapine (REMERON SOL-TAB) 15 MG disintegrating tablet Take 15 mg by mouth at bedtime.     Multiple Vitamin (MULTIVITAMIN) tablet Take 1 tablet by mouth daily.     omeprazole (PRILOSEC) 20 MG capsule Take 1 capsule by mouth daily.     potassium chloride (KLOR-CON) 10 MEQ tablet Take 10 mEq by mouth 2 (two) times daily.     tacrolimus (PROGRAF) 1 MG capsule Take by mouth as directed.     tamsulosin (FLOMAX) 0.4 MG CAPS capsule Take 0.4 mg by mouth.     tiZANidine (ZANAFLEX) 4 MG tablet Take 1 tablet (4 mg total) by mouth every 6 (six) hours as needed for muscle spasms. 30 tablet 0   traZODone (DESYREL) 50 MG tablet Take 25 mg by mouth at bedtime as needed for sleep.      No current facility-administered medications on file prior to visit.    BP (!) 154/86 (BP Location: Right Arm, Patient Position: Sitting, Cuff Size: Normal)   Pulse 63   Temp 97.6 F (36.4 C) (Oral)   Wt 206 lb 9.6 oz (93.7 kg)   SpO2 94%   BMI 28.81 kg/m chart      Objective:    BP (!) 154/86 (BP Location: Right Arm, Patient Position: Sitting, Cuff Size: Normal)   Pulse 63   Temp 97.6 F (36.4 C) (Oral)   Wt 206 lb 9.6 oz (93.7 kg)   SpO2 94%   BMI 28.81 kg/m    Physical Exam Vitals and nursing note reviewed.  Constitutional:      Appearance: Normal appearance. He is normal weight.  HENT:     Right Ear: Tympanic membrane, ear canal and external ear normal.     Left Ear:  Tympanic membrane, ear canal and external ear normal.     Nose: Nose normal.     Mouth/Throat:     Mouth: Mucous membranes are moist.  Cardiovascular:     Rate and Rhythm: Normal rate and regular rhythm.  Pulmonary:     Effort: Pulmonary effort is normal.     Breath sounds: Normal breath sounds.  Musculoskeletal:        General: Normal range of motion.     Cervical back: Normal range of motion and neck supple.  Skin:    General: Skin is warm and dry.  Neurological:     General: No focal deficit present.     Mental Status: He is alert and oriented to person, place, and time.  Psychiatric:        Mood and Affect: Mood normal.        Behavior: Behavior normal.        Thought Content: Thought content normal.     No results found for any visits on 04/16/22.      Assessment & Plan:  Problem List Items Addressed This Visit   None Visit Diagnoses     Community acquired pneumonia, unspecified laterality    -  Primary   Relevant Medications   doxycycline (VIBRA-TABS) 100 MG tablet   Other Relevant Orders   DG Chest 2 View      Continue and complete Doxycycline. Repeat CXR in 4 weeks. Call the office if symptoms worsen or persist.    Return in about 27 days (around 05/13/2022) for Repeat CXR.  Kennyth Arnold, FNP

## 2022-04-17 NOTE — Telephone Encounter (Signed)
--  Caller states her husband was seen yesterday at the office and dx with pneumonia States he wnts to be admittd to the hospital states no chest pain but he does have shortness of breathe with exertion even getting out of the bed gets him winded was prescribved doxycycline at UC six days ago having alot of wheezing drinking fluids coughing up foamy secretions hx of kidney transplant  04/17/2022 9:35:58 AM Call PCP Now Kirk Ruths, RN, [facility]  Comments User: Tawni Levy, RN Date/Time (Eastern Time): 04/17/2022 9:41:36 AM No appt avaiolable pt is sent to ER  Referrals REFERRED TO PCP OFFICE  04/17/22 1020 - Pt states he has SOB & pt states he's "full of fluid". Pt states he went to [facility] on Friday & had xray & was diagnosed with pneumonia at that time. Wife states NP said yesterday that chest was "more clear". O2 saturation 95% per wife. Pt is declining to go to ED b/c he will wait; he wants a direct admit. Advised that we don't do that & he stated "well never mind then." Wife states that if he gets worse she will call 911. Wife denies further ques/concerns at this time.

## 2022-05-13 ENCOUNTER — Ambulatory Visit (INDEPENDENT_AMBULATORY_CARE_PROVIDER_SITE_OTHER)
Admission: RE | Admit: 2022-05-13 | Discharge: 2022-05-13 | Disposition: A | Discharge status: Home / Self Care | Payer: Medicare PPO | Source: Ambulatory Visit | Attending: Family | Admitting: Family

## 2022-05-13 ENCOUNTER — Other Ambulatory Visit: Payer: Medicare PPO

## 2022-05-13 DIAGNOSIS — J189 Pneumonia, unspecified organism: Secondary | ICD-10-CM

## 2022-05-14 ENCOUNTER — Ambulatory Visit (INDEPENDENT_AMBULATORY_CARE_PROVIDER_SITE_OTHER): Payer: Medicare PPO

## 2022-05-14 VITALS — Ht 70.0 in | Wt 206.0 lb

## 2022-05-14 DIAGNOSIS — Z0000 Encounter for general adult medical examination without abnormal findings: Secondary | ICD-10-CM

## 2022-05-14 DIAGNOSIS — Z1211 Encounter for screening for malignant neoplasm of colon: Secondary | ICD-10-CM

## 2022-05-14 NOTE — Progress Notes (Signed)
Subjective:   Jeremy Black is a 77 y.o. male who presents for Medicare Annual/Subsequent preventive examination.  Review of Systems    Virtual Visit via Telephone Note  I connected with  Vilma Meckel on 05/14/22 at  9:15 AM EST by telephone and verified that I am speaking with the correct person using two identifiers.  Location: Patient: Home Provider: Office Persons participating in the virtual visit: patient/Nurse Health Advisor   I discussed the limitations, risks, security and privacy concerns of performing an evaluation and management service by telephone and the availability of in person appointments. The patient expressed understanding and agreed to proceed.  Interactive audio and video telecommunications were attempted between this nurse and patient, however failed, due to patient having technical difficulties OR patient did not have access to video capability.  We continued and completed visit with audio only.  Some vital signs may be absent or patient reported.   Criselda Peaches, LPN  Cardiac Risk Factors include: advanced age (>45mn, >>23women);hypertension;diabetes mellitus     Objective:    Today's Vitals   05/14/22 0924  Weight: 206 lb (93.4 kg)  Height: '5\' 10"'$  (1.778 m)   Body mass index is 29.56 kg/m.     05/14/2022    9:35 AM 05/18/2021    8:11 AM 05/03/2021   10:00 AM 02/05/2017    7:13 PM 10/18/2016   12:57 PM 10/17/2016    3:20 PM 09/18/2016    8:45 AM  Advanced Directives  Does Patient Have a Medical Advance Directive? Yes Yes Yes No Yes Yes No  Type of AParamed[place]of [place] will [place] will [place]  Living will;Healthcare Power of [place] will   Does patient want to make changes to medical advance directive?  No - Patient declined No - Patient declined  No - Patient declined    Copy of [place]  Chart? No - copy requested No - copy requested No - copy requested  No - copy requested    Would patient like information on creating a medical advance directive?    Yes (ED - Information included in AVS)       Current Medications (verified) Outpatient Encounter Medications as of 05/14/2022  Medication Sig   amLODipine (NORVASC) 5 MG tablet Take 1 tablet by mouth daily.   aspirin (ASPIRIN 81) 81 MG chewable tablet 1 tablet Orally Once a day   azaTHIOprine (IMURAN) 50 MG tablet Take 3 tablets by mouth daily.   carvedilol (COREG) 3.125 MG tablet Take 3.125 mg by mouth 2 (two) times daily.   donepezil (ARICEPT) 5 MG tablet TAKE ONE TABLET BY MOUTH AS DIRECTED BY YOUR MEDICAL PROVIDER -  TAKE ONE TABLET BY MOUTH AT BEDTIME FOR 14 DAYS THEN TAKE TWO TABLETS BY  MOUTH AT BEDTIME FOR 14 DAYS THEN TAKE THREE TABLETS BY MOUTH AT BEDTIME  FOR 14 DAYS THEN TAKE FOUR TABLETS BY MOUTH AT BEDTIME -   TAKE ONE TABLET BY MOUTH AT BEDTIME FOR 14 DAYS THEN TAKE TWO TABLETS BY  MOUTH AT BEDTIME FOR 14 DAYS THEN TAKE THREE TABLETS BY MOUTH AT BEDTIME  FOR 14 DAYS THEN TAKE FOUR TABLETS BY MOUTH AT BEDTIME   doxycycline (VIBRA-TABS) 100 MG tablet Take 100 mg by mouth 2 (two) times daily.   folic acid-vitamin b complex-vitamin c-selenium-zinc (DIALYVITE) 3 MG TABS tablet Take 1 tablet by mouth daily.  memantine (NAMENDA) 10 MG tablet Take by mouth. Take 1/2 tab x 2 weeks, then 1 tab x 2 weeks, then 1.5 tab x 2 weeks then 2 tab daily to continue.   mirtazapine (REMERON SOL-TAB) 15 MG disintegrating tablet Take 15 mg by mouth at bedtime.   Multiple Vitamin (MULTIVITAMIN) tablet Take 1 tablet by mouth daily.   omeprazole (PRILOSEC) 20 MG capsule Take 1 capsule by mouth daily.   potassium chloride (KLOR-CON) 10 MEQ tablet Take 10 mEq by mouth 2 (two) times daily.   tacrolimus (PROGRAF) 1 MG capsule Take by mouth as directed.   tamsulosin (FLOMAX) 0.4 MG CAPS capsule Take 0.4 mg by mouth.   tiZANidine (ZANAFLEX) 4 MG  tablet Take 1 tablet (4 mg total) by mouth every 6 (six) hours as needed for muscle spasms.   traZODone (DESYREL) 50 MG tablet Take 25 mg by mouth at bedtime as needed for sleep.    No facility-administered encounter medications on file as of 05/14/2022.    Allergies (verified) Codeine phosphate and Lisinopril   History: Past Medical History:  Diagnosis Date   Allergy    Arthritis    Cataract    Diabetes mellitus without complication ([place])    ED (erectile dysfunction)    GERD (gastroesophageal reflux disease)    Hx of adenomatous colonic polyps 07/15/2017   Hyperlipidemia    Hypertension    Normal nuclear stress test    myoview neg 12 09 except for HT   Other and unspecified alcohol dependence, unspecified drinking behavior    date of last use 02/13/11   Prostate cancer ([place]) 02/2017   stage t1c, low risk and asymptomatic followed at [facility]   Proteinuria    past record showed '500mg'$  protein excretion per 24 hours eval by renal   PTSD (post-traumatic stress disorder)    from [place] experience [place]   [facility]   [facility] History:  Procedure Laterality Date   ls spinal surgery     NM MYOVIEW LTD  12/09   neg except for HT   PROSTATE BIOPSY  2018   stage t1c cancer   TRIGGER FINGER RELEASE Left 10/18/2016   Procedure: LEFT TRIGGER THUMB RELEASE;  Surgeon: Milly Jakob, MD;  Location: [place];  Service: Orthopedics;  Laterality: Left;   Family History  Problem Relation Age of Onset   Diabetes Mother    Hypertension Mother    Other Mother        nerves   Throat cancer Father        deceased   Coronary artery disease Sister    Diabetes Sister    Heart disease Sister    Hypertension Brother    Alcohol abuse Paternal Aunt    Alcohol abuse Cousin    Drug abuse Son        cocaine dependent   Social History   Socioeconomic History   Marital status: Legally Separated    Spouse name: Not on file   Number  of children: Not on file   Years of education: Not on file   Highest education level: Not on file  Occupational History   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   Working with disabled adults.    Playedgolf  Hunting in past          Social Determinants of Health   Financial Resource Strain: Low Risk  (05/14/2022)   Overall Financial Resource Strain (CARDIA)    Difficulty of Paying Living Expenses: Not hard at all  Food Insecurity: No Food Insecurity (05/14/2022)   Hunger Vital Sign    Worried About Running Out of Food in the Last Year: Never true    Ran Out of Food in the Last Year: Never true  Transportation Needs: No Transportation Needs (05/14/2022)   PRAPARE - Hydrologist (Medical): No    Lack of Transportation (Non-Medical): No  Physical Activity: Inactive (05/14/2022)   Exercise Vital Sign    Days of Exercise per Week: 0 days    Minutes of Exercise per Session: 0 min  Stress: No Stress Concern Present (05/14/2022)   [place]    Feeling of Stress : Not at all  Social Connections: [place] (05/14/2022)   Social Connection and Isolation Panel [NHANES]    Frequency of Communication with Friends and Family: More than three times a week    Frequency of Social Gatherings with Friends and Family: More than three times a week    Attends Religious Services: More than 4 times per year    Active Member of [employer] or Organizations: Yes    Attends Music therapist: More than 4 times per year    Marital Status: Married    Tobacco Counseling Counseling given: Not Answered   Clinical Intake:  Pre-visit preparation completed: No  Pain : No/denies pain   Nutrition Risk Assessment:  Has the patient had any N/V/D within the last 2 months?  No  Does the patient have any non-healing wounds?  No   Has the patient had any unintentional weight loss or weight gain?  No   Diabetes:  Is the patient diabetic?  Yes  If diabetic, was a CBG obtained today?  No  Did the patient bring in their glucometer from home?  No  How often do you monitor your CBG's? PRN.   Financial Strains and Diabetes Management:  Are you having any financial strains with the device, your supplies or your medication? No .  Does the patient want to be seen by Chronic Care Management for management of their diabetes?  No  Would the patient like to be referred to a Nutritionist or for Diabetic Management?  No   Diabetic Exams:  Diabetic Eye  Exam: Completed No. Overdue for diabetic eye exam. Pt has been advised about the importance in completing this exam. A referral has been placed today. Message sent to referral coordinator for scheduling purposes. Advised pt to expect a call from office referred to regarding appt.  Diabetic Foot Exam: Completed No. Pt has been advised about the importance in completing this exam. Pt is scheduled for diabetic foot exam on Followed by PCP.    BMI - recorded:  29.56 Nutritional Status: BMI 25 -29 Overweight Nutritional Risks: None Diabetes: No  How often do you need to have someone help you when you read instructions, pamphlets, or other written materials from your doctor or pharmacy?: 1 - Never  Diabetic?  Yes   Interpreter Needed?: No  Information entered by :: Rolene Arbour LPN   Activities of Daily Living    05/14/2022    9:32 AM  In your present state of health, do you have any difficulty performing the following activities:  Hearing? 1  Comment Wears Hearing Aids  Vision? 0  Difficulty concentrating or making decisions? 0  Walking or climbing stairs? 0  Dressing or bathing? 0  Doing errands, shopping? 0  Preparing Food and eating ? N  Using the Toilet? N  In the past six months, have you accidently leaked urine? N  Do you have problems with loss of bowel control? N  Managing your Medications? N  Managing your Finances? N  Housekeeping or managing your Housekeeping? N    Patient Care Team: Panosh, Standley Brooking, MD as PCP - General Corliss Parish, MD (Nephrology) Psychiatrist and PCP at the [facility], MD as Consulting Physician (Neurosurgery) Vanita Ingles, MD as Consulting Physician (Cardiology) Latricia Heft as Attending Physician  Indicate any recent Medical Services you may have received from other than [facility] providers in the past year (date may be approximate).     Assessment:   This is a routine wellness examination for [place].  Hearing/Vision screen Hearing Screening - Comments:: Wears Hearing aids Vision Screening - Comments:: Wears rx glasses - up to date with [facility] exams with  [facility]  Dietary issues and exercise activities discussed: Exercise limited by: None identified   Goals Addressed               This Visit's Progress     No current goals (pt-stated)         Depression Screen    05/14/2022    9:31 AM 05/14/2022    9:30 AM 04/16/2022    5:13  PM 11/13/2021    3:06 PM 05/03/2021    9:47 AM 03/13/2021    1:54 PM 01/05/2019    9:46 AM  PHQ 2/9 Scores  PHQ - 2 Score 0 0 6 6 0 3 0  PHQ- 9 Score 0 0 20 12 0 7     Fall Risk    05/14/2022    9:35 AM 05/09/2021    9:42 AM 05/03/2021    9:55 AM 11/14/2020   11:02 AM 01/05/2019    9:46 AM  Fall Risk   Falls in the past year? 0 0 0 1 0  Number falls in past yr: 0  0 1 0  Injury with Fall? 0  0 0 0  Risk for fall due to : No Fall Risks      Follow up Falls prevention discussed        [place]:  Any stairs in or around the home? Yes  If so, are there any without handrails? No  Home free of loose throw rugs in walkways, pet beds, electrical cords, etc? Yes  Adequate lighting in your home to reduce risk of falls? Yes   ASSISTIVE DEVICES UTILIZED TO PREVENT FALLS:  Life alert? No  Use of a cane, walker or w/c? Yes  Grab bars in the bathroom? No  Shower chair or bench in shower? No  Elevated toilet seat or a handicapped toilet? Yes   TIMED UP AND GO:  Was the test performed? No . Audio Visit  Cognitive Function:    09/18/2016    8:48 AM  MMSE - Mini Mental State Exam  Orientation to time 5  Orientation to Place 5  Registration 3  Attention/ Calculation 5  Recall 1  Language- name 2 objects 2  Language- repeat 1  Language- follow 3 step command 3  Language- read & follow direction 1  Write a sentence 1  Copy design 0  Total score 27        05/14/2022    9:35 AM 05/03/2021    9:58 AM  6CIT Screen  What Year? 0 points 0 points  What month? 0 points 0 points  What time? 0 points 0 points  Count back from 20 0 points 0 points  Months in reverse 0 points 0 points  Repeat phrase 0 points 0 points  Total Score 0 points 0 points    Immunizations Immunization History  Administered Date(s) Administered   Covid-19, Mrna,Vaccine(Spikevax)49yr and older 02/26/2022   Hepatitis B 08/06/2005   Influenza Split 01/21/2011, 01/13/2014    Influenza Whole 02/08/2009, 01/11/2010   Influenza, High Dose Seasonal PF 01/13/2017, 12/31/2017   Influenza-Unspecified 12/14/2012   Pneumococcal Conjugate-13 09/14/2013, 01/25/2014   Td 12/07/2002, 04/20/2014    TDAP status: Up to date  Flu Vaccine status: Up to date  Pneumococcal vaccine status: Due, Education has been provided regarding the importance of this vaccine. Advised may receive this vaccine at local pharmacy or Health Dept. Aware to provide a copy of the vaccination record if obtained from local pharmacy or Health Dept. Verbalized acceptance and understanding.  Covid-19 vaccine status: Completed vaccines  Qualifies for Shingles Vaccine? Yes   Zostavax completed Yes   Shingrix Completed?: Yes  Screening Tests Health Maintenance  Topic Date Due   HEMOGLOBIN A1C  07/28/2019   OPHTHALMOLOGY EXAM  05/14/2022 (Originally 12/18/2021)   FOOT EXAM  05/15/2022 (Originally 01/27/2020)   COVID-19 Vaccine (6 - 2023-24 season) 05/30/2022 (Originally 04/23/2022)   INFLUENZA VACCINE  07/14/2022 (Originally 11/13/2021)   Lung Cancer Screening  05/15/2023 (Originally 03/29/2010)   Pneumonia Vaccine 77 Years old (2 - PPSV23 or PCV20) 05/15/2023 (Originally 03/22/2014)   COLONOSCOPY (Pts 45-419yrInsurance coverage will need to be confirmed)  05/15/2023 (Originally 07/08/2020)   Medicare Annual Wellness (AWV)  05/15/2023   DTaP/Tdap/Td (3 - Tdap) 04/20/2024   Zoster Vaccines- Shingrix  Completed   HPV VACCINES  Aged Out    Health Maintenance  Health Maintenance Due  Topic Date Due   HEMOGLOBIN A1C  07/28/2019    Colorectal cancer screening: Referral to GI placed 05/14/22. Pt aware the office will call re: appt.  Lung Cancer Screening: (Low Dose CT Chest recommended if Age 387-80ears, 30 pack-year currently smoking OR have quit w/in 15years.) does qualify.   Lung Cancer Screening Referral: Deferred  Additional Screening:  Hepatitis C Screening: does qualify; Deferred  Vision  Screening: Recommended  annual ophthalmology exams for early detection of glaucoma and other disorders of the eye. Is the patient up to date with their annual eye exam?  Yes  Who is the provider or what is the name of the office in which the patient attends annual eye exams? [facility] If pt is not established with a provider, would they like to be referred to a provider to establish care? No .   Dental Screening: Recommended annual dental exams for proper oral hygiene  Community Resource Referral / Chronic Care Management:  CRR required this visit?  No   CCM required this visit?  No      Plan:     I have personally reviewed and noted the following in the patient's chart:   Medical and social history Use of alcohol, tobacco or illicit drugs  Current medications and supplements including opioid prescriptions. Patient is not currently taking opioid prescriptions. Functional ability and status Nutritional status Physical activity Advanced directives List of other physicians Hospitalizations, surgeries, and ER visits in previous 12 months Vitals Screenings to include cognitive, depression, and falls Referrals and appointments  In addition, I have reviewed and discussed with patient certain preventive protocols, quality metrics, and best practice recommendations. A written personalized care plan for preventive services as well as general preventive health recommendations were provided to patient.     Criselda Peaches, LPN   2/68/3419   Nurse Notes: Patient due Hemoglobin A1C and Lung Cancer Screening

## 2022-05-14 NOTE — Patient Instructions (Addendum)
[name] , Thank you for taking time to come for your Medicare Wellness Visit. I appreciate your ongoing commitment to your health goals. Please review the following plan we discussed and let me know if I can assist you in the future.   These are the goals we discussed:  Goals       Exercise 150 minutes per week (moderate activity)      Start exercising in the pool It is great therapy as well as easy on your joints Will go 3 times a week.      No current goals (pt-stated)      Patient Stated      Set a goal to get a kidney !        This is a list of the screening recommended for you and due dates:  Health Maintenance  Topic Date Due   Hemoglobin A1C  07/28/2019   Eye exam for diabetics  05/14/2022*   Complete foot exam   05/15/2022*   COVID-19 Vaccine (6 - 2023-24 season) 05/30/2022*   Flu Shot  07/14/2022*   Screening for Lung Cancer  05/15/2023*   Pneumonia Vaccine (2 - PPSV23 or PCV20) 05/15/2023*   Colon Cancer Screening  05/15/2023*   Medicare Annual Wellness Visit  05/15/2023   DTaP/Tdap/Td vaccine (3 - Tdap) 04/20/2024   Zoster (Shingles) Vaccine  Completed   HPV Vaccine  Aged Out  *Topic was postponed. The date shown is not the original due date.    Advanced directives: Please bring a copy of your health care power of attorney and living will to the office to be added to your chart at your convenience.   Conditions/risks identified: None  Next appointment: Follow up in one year for your annual wellness visit.    Preventive Care 56 Years and Older, Male  Preventive care refers to lifestyle choices and visits with your health care provider that can promote health and wellness. What does preventive care include? A yearly physical exam. This is also called an annual well check. Dental exams once or twice a year. Routine eye exams. Ask your health care provider how often you should have your eyes checked. Personal lifestyle choices, including: Daily care of your  teeth and gums. Regular physical activity. Eating a healthy diet. Avoiding tobacco and drug use. Limiting alcohol use. Practicing safe sex. Taking low doses of aspirin every day. Taking vitamin and mineral supplements as recommended by your health care provider. What happens during an annual well check? The services and screenings done by your health care provider during your annual well check will depend on your age, overall health, lifestyle risk factors, and family history of disease. Counseling  Your health care provider may ask you questions about your: Alcohol use. Tobacco use. Drug use. Emotional well-being. Home and relationship well-being. Sexual activity. Eating habits. History of falls. Memory and ability to understand (cognition). Work and work Statistician. Screening  You may have the following tests or measurements: Height, weight, and BMI. Blood pressure. Lipid and cholesterol levels. These may be checked every 5 years, or more frequently if you are over 49 years old. Skin check. Lung cancer screening. You may have this screening every year starting at age 63 if you have a 30-pack-year history of smoking and currently smoke or have quit within the past 15 years. Fecal occult blood test (FOBT) of the stool. You may have this test every year starting at age 81. Flexible sigmoidoscopy or colonoscopy. You may have a sigmoidoscopy  every 5 years or a colonoscopy every 10 years starting at age 21. Prostate cancer screening. Recommendations will vary depending on your family history and other risks. Hepatitis C blood test. Hepatitis B blood test. Sexually transmitted disease (STD) testing. Diabetes screening. This is done by checking your blood sugar (glucose) after you have not eaten for a while (fasting). You may have this done every 1-3 years. Abdominal aortic aneurysm (AAA) screening. You may need this if you are a current or former smoker. Osteoporosis. You may be  screened starting at age 46 if you are at high risk. Talk with your health care provider about your test results, treatment options, and if necessary, the need for more tests. Vaccines  Your health care provider may recommend certain vaccines, such as: Influenza vaccine. This is recommended every year. Tetanus, diphtheria, and acellular pertussis (Tdap, Td) vaccine. You may need a Td booster every 10 years. Zoster vaccine. You may need this after age 70. Pneumococcal 13-valent conjugate (PCV13) vaccine. One dose is recommended after age 59. Pneumococcal polysaccharide (PPSV23) vaccine. One dose is recommended after age 39. Talk to your health care provider about which screenings and vaccines you need and how often you need them. This information is not intended to replace advice given to you by your health care provider. Make sure you discuss any questions you have with your health care provider. Document Released: 04/28/2015 Document Revised: 12/20/2015 Document Reviewed: 01/31/2015 Elsevier Interactive Patient Education  2017 [place] Prevention in the Home Falls can cause injuries. They can happen to people of all ages. There are many things you can do to make your home safe and to help prevent falls. What can I do on the outside of my home? Regularly fix the edges of walkways and driveways and fix any cracks. Remove anything that might make you trip as you walk through a door, such as a raised step or threshold. Trim any bushes or trees on the path to your home. Use bright outdoor lighting. Clear any walking paths of anything that might make someone trip, such as rocks or tools. Regularly check to see if handrails are loose or broken. Make sure that both sides of any steps have handrails. Any raised decks and porches should have guardrails on the edges. Have any leaves, snow, or ice cleared regularly. Use sand or salt on walking paths during winter. Clean up any spills in  your garage right away. This includes oil or grease spills. What can I do in the bathroom? Use night lights. Install grab bars by the toilet and in the tub and shower. Do not use towel bars as grab bars. Use non-skid mats or decals in the tub or shower. If you need to sit down in the shower, use a plastic, non-slip stool. Keep the floor dry. Clean up any water that spills on the floor as soon as it happens. Remove soap buildup in the tub or shower regularly. Attach bath mats securely with double-sided non-slip rug tape. Do not have throw rugs and other things on the floor that can make you trip. What can I do in the bedroom? Use night lights. Make sure that you have a light by your bed that is easy to reach. Do not use any sheets or blankets that are too big for your bed. They should not hang down onto the floor. Have a firm chair that has side arms. You can use this for support while you get dressed. Do not have  throw rugs and other things on the floor that can make you trip. What can I do in the kitchen? Clean up any spills right away. Avoid walking on wet floors. Keep items that you use a lot in easy-to-reach places. If you need to reach something above you, use a strong step stool that has a grab bar. Keep electrical cords out of the way. Do not use floor polish or wax that makes floors slippery. If you must use wax, use non-skid floor wax. Do not have throw rugs and other things on the floor that can make you trip. What can I do with my stairs? Do not leave any items on the stairs. Make sure that there are handrails on both sides of the stairs and use them. Fix handrails that are broken or loose. Make sure that handrails are as long as the stairways. Check any carpeting to make sure that it is firmly attached to the stairs. Fix any carpet that is loose or worn. Avoid having throw rugs at the top or bottom of the stairs. If you do have throw rugs, attach them to the floor with carpet  tape. Make sure that you have a light switch at the top of the stairs and the bottom of the stairs. If you do not have them, ask someone to add them for you. What else can I do to help prevent falls? Wear shoes that: Do not have high heels. Have rubber bottoms. Are comfortable and fit you well. Are closed at the toe. Do not wear sandals. If you use a stepladder: Make sure that it is fully opened. Do not climb a closed stepladder. Make sure that both sides of the stepladder are locked into place. Ask someone to hold it for you, if possible. Clearly mark and make sure that you can see: Any grab bars or handrails. First and last steps. Where the edge of each step is. Use tools that help you move around (mobility aids) if they are needed. These include: Canes. Walkers. Scooters. Crutches. Turn on the lights when you go into a dark area. Replace any light bulbs as soon as they burn out. Set up your furniture so you have a clear path. Avoid moving your furniture around. If any of your floors are uneven, fix them. If there are any pets around you, be aware of where they are. Review your medicines with your doctor. Some medicines can make you feel dizzy. This can increase your chance of falling. Ask your doctor what other things that you can do to help prevent falls. This information is not intended to replace advice given to you by your health care provider. Make sure you discuss any questions you have with your health care provider. Document Released: 01/26/2009 Document Revised: 09/07/2015 Document Reviewed: 05/06/2014 Elsevier Interactive Patient Education  2017 [employer].

## 2022-06-03 ENCOUNTER — Telehealth: Payer: Self-pay

## 2022-06-03 NOTE — Transitions of Care (Post Inpatient/ED Visit) (Signed)
   06/03/2022  Name: Jeremy Black MRN: [id number] DOB: 1945-09-22  Today's TOC FU Call Status: Today's TOC FU Call Status:: Successful TOC FU Call Competed TOC FU Call Complete Date: 06/03/22  Transition Care Management Follow-up Telephone Call Date of Discharge: 05/31/22 Discharge Facility: Other ([facility]) Name of Other (Non[facility]) Discharge Facility: [facility] Type of Discharge: Inpatient Admission Primary Inpatient Discharge Diagnosis:: "cervical spinal stenosis" How have you been since you were released from the hospital?: Better Any questions or concerns?: Yes Patient Questions/Concerns:: Spouse tates patient has not had a BM since returning home Patient Questions/Concerns Addressed: Other: (provided education to spouse on pain med SEs-being constipation and need for bowel regimen especially while taking pain meds-she will go pick up OTC stool softener/laxative today, also encouraged increased fiber,veggies and fruit in diet)  Items Reviewed: Did you receive and understand the discharge instructions provided?: Yes Medications obtained and verified?: Yes (Medications Reviewed) Any new allergies since your discharge?: No Dietary orders reviewed?: Yes Type of Diet Ordered:: regular Do you have support at home?: Yes People in Home: spouse Name of Support/Comfort Primary Source: [facility] and Equipment/Supplies: [place] Ordered?: No Any new equipment or medical supplies ordered?: No  Functional Questionnaire: Do you need assistance with bathing/showering or dressing?: Yes Do you need assistance with meal preparation?: Yes Do you need assistance with eating?: No Do you have difficulty maintaining continence: No Do you need assistance with getting out of bed/getting out of a chair/moving?: Yes Do you have difficulty managing or taking your medications?: No  Folllow up appointments reviewed: PCP Follow-up appointment confirmed?: No (spouse  states patient just saw PCP last month-only wants to follow up with surgeon) [facility] Follow-up appointment confirmed?: Yes Date of Specialist follow-up appointment?: 06/27/22 Follow-Up Specialty Provider:: surgeon Do you need transportation to your follow-up appointment?: No Do you understand care options if your condition(s) worsen?: Yes-patient verbalized understanding  SDOH Interventions Today    Flowsheet Row Most Recent Value  SDOH Interventions   Food Insecurity Interventions Intervention Not Indicated  Transportation Interventions Intervention Not Indicated       TOC Interventions Today    Flowsheet Row Most Recent Value  TOC Interventions   TOC Interventions Discussed/Reviewed TOC Interventions Discussed, Post discharge activity limitations per provider, S/S of infection, Post op wound/incision care       Interventions Today    Flowsheet Row Most Recent Value  Education Interventions   Education Provided Provided Education  [bowel regimen]  Provided Verbal Education On Nutrition, Medication  Nutrition Interventions   Nutrition Discussed/Reviewed Nutrition Discussed, Adding fruits and vegetables  Pharmacy Interventions   Pharmacy Dicussed/Reviewed Pharmacy Topics Discussed, Medications and their functions  Safety Interventions   Safety Discussed/Reviewed Safety Discussed, Fall Risk        Enzo Montgomery, RN,BSN,CCM [facility] Telephonic Care Management Coordinator Direct Phone: [phone] Toll Free: [phone] Fax: [phone]

## 2022-07-25 DIAGNOSIS — I10 Essential (primary) hypertension: Secondary | ICD-10-CM | POA: Diagnosis not present

## 2022-07-25 DIAGNOSIS — D849 Immunodeficiency, unspecified: Secondary | ICD-10-CM | POA: Diagnosis not present

## 2022-07-25 DIAGNOSIS — E119 Type 2 diabetes mellitus without complications: Secondary | ICD-10-CM | POA: Diagnosis not present

## 2022-07-25 DIAGNOSIS — Z79899 Other long term (current) drug therapy: Secondary | ICD-10-CM | POA: Diagnosis not present

## 2022-07-25 DIAGNOSIS — Z940 Kidney transplant status: Secondary | ICD-10-CM | POA: Diagnosis not present

## 2022-09-17 ENCOUNTER — Telehealth: Payer: Self-pay

## 2022-09-17 NOTE — Telephone Encounter (Signed)
-----   Message from Sherrill Raring, [employer] sent at 09/17/2022 12:05 PM EDT ----- Regarding: 2300 Pharmacy Referral Hello,  Patient is currently flagged as high risk for hospitalization and is eligible to meet with me through the Care Coordination program.  Could a 2300-Pharmacy referral for medication management be placed for the patient so that I can get them scheduled?  Thank you! Sherrill Raring Clinical Pharmacist [phone]

## 2022-09-20 ENCOUNTER — Telehealth: Payer: Self-pay

## 2022-09-20 DIAGNOSIS — E1121 Type 2 diabetes mellitus with diabetic nephropathy: Secondary | ICD-10-CM

## 2022-09-20 DIAGNOSIS — I10 Essential (primary) hypertension: Secondary | ICD-10-CM

## 2022-09-20 NOTE — Telephone Encounter (Signed)
-----   Message from Madelin Headings, MD sent at 09/18/2022  9:23 AM EDT ----- Regarding: RE: 2300 Pharmacy Referral So Me.  Jeremy Black gets the majority of care through the [place] and is a renal transplant patient . He has some dementia but is doing ok and his wife comes to all of his visits. He has multiple spcialists at the [place]  May want to contact them first to see if they want a referral and ok with me  if so WP  ----- Message ----- From: Sherrill Raring, [facility] Sent: 09/17/2022  12:05 PM EDT To: Madelin Headings, MD; Lars Mage Subject: 2300 Pharmacy Referral                         Hello,  Patient is currently flagged as high risk for hospitalization and is eligible to meet with me through the Care Coordination program.  Could a 2300-Pharmacy referral for medication management be placed for the patient so that I can get them scheduled?  Thank you! Sherrill Raring Clinical Pharmacist [phone]

## 2022-09-20 NOTE — Telephone Encounter (Signed)
Spoke to pt. Pt is okay with the referral.  Pt request to schedule a cpe with pcp. An appt is made.   A referral is placed.

## 2022-10-15 NOTE — Progress Notes (Unsigned)
No chief complaint on file.   HPI: Patient  Jeremy Black  77 y.o. comes in today for Preventive Health Care visit  With multiple med conditions followed and treated mostly at the [place]  is s/p renal transplant  Dm ptsd cognition changes  on aricept and namenda HT   Health Maintenance  Topic Date Due   HEMOGLOBIN A1C  07/28/2019   FOOT EXAM  01/27/2020   OPHTHALMOLOGY EXAM  12/18/2021   COVID-19 Vaccine (6 - 2023-24 season) 04/23/2022   Lung Cancer Screening  05/15/2023 (Originally 03/29/2010)   Pneumonia Vaccine 53+ Years old (2 of 2 - PPSV23 or PCV20) 05/15/2023 (Originally 03/22/2014)   Colonoscopy  05/15/2023 (Originally 07/08/2020)   INFLUENZA VACCINE  11/14/2022   Medicare Annual Wellness (AWV)  05/15/2023   DTaP/Tdap/Td (3 - Tdap) 04/20/2024   Zoster Vaccines- Shingrix  Completed   HPV VACCINES  Aged Out   Health Maintenance Review LIFESTYLE:  Exercise:   Tobacco/ETS: Alcohol:  Sugar beverages: Sleep: Drug use: no HH of  Work:    ROS:  GEN/ HEENT: No fever, significant weight changes sweats headaches vision problems hearing changes, CV/ PULM; No chest pain shortness of breath cough, syncope,edema  change in exercise tolerance. GI /GU: No adominal pain, vomiting, change in bowel habits. No blood in the stool. No significant GU symptoms. SKIN/HEME: ,no acute skin rashes suspicious lesions or bleeding. No lymphadenopathy, nodules, masses.  NEURO/ PSYCH:  No neurologic signs such as weakness numbness. No depression anxiety. IMM/ Allergy: No unusual infections.  Allergy .   REST of 12 system review negative except as per HPI   Past Medical History:  Diagnosis Date   Allergy    Arthritis    Cataract    Diabetes mellitus without complication (HCC)    ED (erectile dysfunction)    GERD (gastroesophageal reflux disease)    Hx of adenomatous colonic polyps 07/15/2017   Hyperlipidemia    Hypertension    Normal nuclear stress test    myoview neg 12 09 except for HT    Other and unspecified alcohol dependence, unspecified drinking behavior    date of last use 02/13/11   Prostate cancer (HCC) 02/2017   stage t1c, low risk and asymptomatic followed at [facility]   Proteinuria    past record showed 500mg  protein excretion per 24 hours eval by renal   PTSD (post-traumatic stress disorder)    from [place] experience [place]   [facility]    [facility]:  Procedure Laterality Date   ls spinal surgery     NM MYOVIEW LTD  12/09   neg except for HT   PROSTATE BIOPSY  2018   stage t1c cancer   TRIGGER FINGER RELEASE Left 10/18/2016   Procedure: LEFT TRIGGER THUMB RELEASE;  Surgeon: Mack Hook, MD;  Location: [facility];  Service: Orthopedics;  Laterality: Left;    Family History  Problem Relation Age of Onset   Diabetes Mother    Hypertension Mother    Other Mother        nerves   Throat cancer Father        deceased   Coronary artery disease Sister    Diabetes Sister    Heart disease Sister    Hypertension Brother    Alcohol abuse Paternal Aunt    Alcohol abuse Cousin    Drug abuse Son        cocaine dependent  Social History   Socioeconomic History   Marital status: Legally Separated    Spouse name: Not on file   Number of children: Not on file   Years of education: Not on file   Highest education level: Not on file  Occupational History   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   Working with disabled adults.    Playedgolf  Hunting in past          Social Determinants of Health   Financial Resource Strain: Low Risk  (05/14/2022)   Overall Financial Resource Strain (CARDIA)    Difficulty of Paying Living Expenses: Not hard at all  Food Insecurity: No Food Insecurity (06/03/2022)   Hunger Vital Sign    Worried About Running Out of Food in the Last Year: Never true    Ran Out of Food in the Last Year: Never true  Transportation Needs: No Transportation Needs (06/03/2022)   PRAPARE - Administrator, Civil Service (Medical): No    Lack of Transportation (Non-Medical): No  Physical Activity: Inactive (05/14/2022)   Exercise Vital Sign    Days of Exercise per Week: 0 days    Minutes of Exercise per Session: 0 min  Stress: No Stress Concern Present (05/14/2022)   [employer] of Occupational Health - Occupational Stress Questionnaire    Feeling of Stress : Not at all  Social Connections: Socially Integrated (05/14/2022)   Social Connection and Isolation Panel [NHANES]    Frequency of Communication with Friends and Family: More than three times a week    Frequency of Social Gatherings with Friends and Family: More than three times a week    Attends Religious Services: More than 4 times per year    Active Member of [employer] or Organizations: Yes    Attends Engineer, structural: More than 4 times per year    Marital Status: Married    Outpatient Medications Prior to Visit  Medication Sig Dispense Refill    amLODipine (NORVASC) 5 MG tablet Take 1 tablet by mouth daily.     aspirin (ASPIRIN 81) 81 MG chewable tablet Med on hold post-op until 06/04/22     azaTHIOprine (IMURAN) 50 MG tablet Take 3 tablets by mouth daily.     carvedilol (COREG) 3.125 MG  tablet Take 3.125 mg by mouth 2 (two) times daily.     donepezil (ARICEPT) 5 MG tablet TAKE ONE TABLET BY MOUTH AS DIRECTED BY YOUR MEDICAL PROVIDER -  TAKE ONE TABLET BY MOUTH AT BEDTIME FOR 14 DAYS THEN TAKE TWO TABLETS BY  MOUTH AT BEDTIME FOR 14 DAYS THEN TAKE THREE TABLETS BY MOUTH AT BEDTIME  FOR 14 DAYS THEN TAKE FOUR TABLETS BY MOUTH AT BEDTIME -   TAKE ONE TABLET BY MOUTH AT BEDTIME FOR 14 DAYS THEN TAKE TWO TABLETS BY  MOUTH AT BEDTIME FOR 14 DAYS THEN TAKE THREE TABLETS BY MOUTH AT BEDTIME  FOR 14 DAYS THEN TAKE FOUR TABLETS BY MOUTH AT BEDTIME     doxycycline (VIBRA-TABS) 100 MG tablet Take 100 mg by mouth 2 (two) times daily.     folic acid-vitamin b complex-vitamin c-selenium-zinc (DIALYVITE) 3 MG TABS tablet Take 1 tablet by mouth daily.     HYDROcodone-acetaminophen (NORCO/VICODIN) 5-325 MG tablet Take 2 tablets by mouth every 4 (four) hours as needed for moderate pain.     memantine (NAMENDA) 10 MG tablet Take by mouth. Take 1/2 tab x 2 weeks, then 1 tab x 2 weeks, then 1.5 tab x 2 weeks then 2 tab daily to continue.     mirtazapine (REMERON SOL-TAB) 15 MG disintegrating tablet Take 15 mg by mouth at bedtime.     Multiple Vitamin (MULTIVITAMIN) tablet Take 1 tablet by mouth daily.     omeprazole (PRILOSEC) 20 MG capsule Take 1 capsule by mouth daily.     potassium chloride (KLOR-CON) 10 MEQ tablet Take 10 mEq by mouth 2 (two) times daily.     tacrolimus (PROGRAF) 1 MG capsule Take by mouth as directed.     tamsulosin (FLOMAX) 0.4 MG CAPS capsule Take 0.4 mg by mouth.     tiZANidine (ZANAFLEX) 4 MG tablet Take 1 tablet (4 mg total) by mouth every 6 (six) hours as needed for muscle spasms. 30 tablet 0   traZODone (DESYREL) 50 MG tablet Take 25  mg by mouth at bedtime as needed for sleep.      No facility-administered medications prior to visit.     EXAM:  There were no vitals taken for this visit.  There is no height or weight on file to calculate BMI. Wt Readings from Last 3 Encounters:  05/14/22 206 lb (93.4 kg)  04/16/22 206 lb 9.6 oz (93.7 kg)  11/13/21 218 lb 9.6 oz (99.2 kg)    Physical Exam: Vital signs reviewed [id number] is a well-developed well-nourished alert cooperative    who appearsr stated age in no acute distress.  HEENT: normocephalic atraumatic , Eyes: PERRL EOM's full, conjunctiva clear, Nares: paten,t no deformity discharge or tenderness., Ears: no deformity EAC's clear TMs with normal landmarks. Mouth: clear OP, no lesions, edema.  Moist mucous membranes. Dentition in adequate repair. NECK: supple without masses, thyromegaly or bruits. CHEST/PULM:  Clear to auscultation and percussion breath sounds equal no wheeze , rales or rhonchi. No chest wall deformities or tenderness. Breast: normal by inspection . No dimpling, discharge, masses, tenderness or discharge . CV: PMI is nondisplaced, S1 S2 no gallops, murmurs, rubs. Peripheral pulses are full without delay.No JVD .  ABDOMEN: Bowel sounds normal nontender  No guard or rebound, no hepato splenomegal no CVA tenderness.  No hernia. Extremtities:  No clubbing cyanosis or edema, no acute joint swelling or redness no focal atrophy NEURO:  Oriented x3, cranial nerves 3-12 appear to be intact, no obvious  focal weakness,gait within normal limits no abnormal reflexes or asymmetrical SKIN: No acute rashes normal turgor, color, no bruising or petechiae. PSYCH: Oriented, good eye contact, no obvious depression anxiety, cognition and judgment appear normal. LN: no cervical axillary inguinal adenopathy  Lab Results  Component Value Date   WBC 7.9 02/05/2017   HGB 9.0 (L) 02/05/2017   HCT 27.4 (L) 02/05/2017   PLT 249 02/05/2017   GLUCOSE 102 (H) 02/05/2017   CHOL  147 04/20/2014   TRIG 129.0 04/20/2014   HDL 42.20 04/20/2014   LDLDIRECT 66.7 01/31/2011   LDLCALC 79 04/20/2014   ALT 23 02/05/2017   AST 20 02/05/2017   NA 140 02/05/2017   K 4.1 02/05/2017   CL 108 02/05/2017   CREATININE 3.98 (H) 02/05/2017   BUN 32 (H) 02/05/2017   CO2 20 (L) 02/05/2017   TSH 0.76 07/09/2012   PSA 0.82 09/07/2009   HGBA1C 5.1 01/27/2019   MICROALBUR 141.5 (H) 09/14/2009    BP Readings from Last 3 Encounters:  04/16/22 (!) 154/86  03/28/22 (!) 163/77  11/13/21 130/70    Lab results reviewed with patient   ASSESSMENT AND PLAN:  Discussed the following assessment and plan:  No diagnosis found. No follow-ups on file.  Patient Care Team: Davis Vannatter, Neta Mends, MD as PCP - General Annie Sable, MD (Nephrology) Psychiatrist and PCP at the [facility], MD as Consulting Physician (Neurosurgery) Donne Anon, MD as Consulting Physician (Cardiology) Edison Nasuti as Attending Physician There are no Patient Instructions on file for this visit.  Neta Mends. Charolett Yarrow M.D.

## 2022-10-16 ENCOUNTER — Ambulatory Visit (INDEPENDENT_AMBULATORY_CARE_PROVIDER_SITE_OTHER): Payer: Medicare PPO | Admitting: Internal Medicine

## 2022-10-16 ENCOUNTER — Encounter: Payer: Self-pay | Admitting: Internal Medicine

## 2022-10-16 VITALS — BP 130/70 | HR 56 | Temp 98.1°F | Ht 71.0 in | Wt 205.8 lb

## 2022-10-16 DIAGNOSIS — Z0000 Encounter for general adult medical examination without abnormal findings: Secondary | ICD-10-CM

## 2022-10-16 DIAGNOSIS — Z940 Kidney transplant status: Secondary | ICD-10-CM | POA: Diagnosis not present

## 2022-10-16 DIAGNOSIS — I10 Essential (primary) hypertension: Secondary | ICD-10-CM

## 2022-10-16 DIAGNOSIS — C61 Malignant neoplasm of prostate: Secondary | ICD-10-CM

## 2022-10-16 DIAGNOSIS — E1121 Type 2 diabetes mellitus with diabetic nephropathy: Secondary | ICD-10-CM

## 2022-10-16 DIAGNOSIS — F0390 Unspecified dementia without behavioral disturbance: Secondary | ICD-10-CM

## 2022-10-16 NOTE — Patient Instructions (Addendum)
Good to see you today .  Can get appt for fasting labs.  Form complete.

## 2022-10-18 ENCOUNTER — Other Ambulatory Visit (INDEPENDENT_AMBULATORY_CARE_PROVIDER_SITE_OTHER): Payer: Medicare PPO

## 2022-10-18 DIAGNOSIS — I10 Essential (primary) hypertension: Secondary | ICD-10-CM | POA: Diagnosis not present

## 2022-10-18 DIAGNOSIS — Z940 Kidney transplant status: Secondary | ICD-10-CM

## 2022-10-18 DIAGNOSIS — E1121 Type 2 diabetes mellitus with diabetic nephropathy: Secondary | ICD-10-CM

## 2022-10-18 DIAGNOSIS — F0390 Unspecified dementia without behavioral disturbance: Secondary | ICD-10-CM

## 2022-10-18 LAB — LIPID PANEL
Cholesterol: 162 mg/dL (ref 0–200)
HDL: 40.8 mg/dL (ref >39.00)
LDL Cholesterol: 91 mg/dL (ref 0–99)
NonHDL: 121.18
Total CHOL/HDL Ratio: 4
Triglycerides: 149 mg/dL (ref 0.0–149.0)
VLDL: 29.8 mg/dL (ref 0.0–40.0)

## 2022-10-18 LAB — HEPATIC FUNCTION PANEL
ALT: 9 U/L (ref 0–53)
AST: 16 U/L (ref 0–37)
Albumin: 4.1 g/dL (ref 3.5–5.2)
Alkaline Phosphatase: 59 U/L (ref 39–117)
Bilirubin, Direct: 0.1 mg/dL (ref 0.0–0.3)
Total Bilirubin: 0.6 mg/dL (ref 0.2–1.2)
Total Protein: 7.1 g/dL (ref 6.0–8.3)

## 2022-10-18 LAB — BASIC METABOLIC PANEL
BUN: 9 mg/dL (ref 6–23)
CO2: 24 mEq/L (ref 19–32)
Calcium: 9.4 mg/dL (ref 8.4–10.5)
Chloride: 111 mEq/L (ref 96–112)
Creatinine, Ser: 1.22 mg/dL (ref 0.40–1.50)
GFR: 57.34 mL/min — ABNORMAL LOW (ref >60.00)
Glucose, Bld: 109 mg/dL — ABNORMAL HIGH (ref 70–99)
Potassium: 3.5 mEq/L (ref 3.5–5.1)
Sodium: 144 mEq/L (ref 135–145)

## 2022-10-18 LAB — CBC WITH DIFFERENTIAL/PLATELET
Basophils Absolute: 0 10*3/uL (ref 0.0–0.1)
Basophils Relative: 0.6 % (ref 0.0–3.0)
Eosinophils Absolute: 0.1 10*3/uL (ref 0.0–0.7)
Eosinophils Relative: 1.4 % (ref 0.0–5.0)
HCT: 47.8 % (ref 39.0–52.0)
Hemoglobin: 15.4 g/dL (ref 13.0–17.0)
Lymphocytes Relative: 13.6 % (ref 12.0–46.0)
Lymphs Abs: 0.8 10*3/uL (ref 0.7–4.0)
MCHC: 32.1 g/dL (ref 30.0–36.0)
MCV: 88.2 fl (ref 78.0–100.0)
Monocytes Absolute: 0.6 10*3/uL (ref 0.1–1.0)
Monocytes Relative: 9.9 % (ref 3.0–12.0)
Neutro Abs: 4.6 10*3/uL (ref 1.4–7.7)
Neutrophils Relative %: 74.5 % (ref 43.0–77.0)
Platelets: 259 10*3/uL (ref 150.0–400.0)
RBC: 5.42 Mil/uL (ref 4.22–5.81)
RDW: 16.1 % — ABNORMAL HIGH (ref 11.5–15.5)
WBC: 6.1 10*3/uL (ref 4.0–10.5)

## 2022-10-18 LAB — HEMOGLOBIN A1C: Hgb A1c MFr Bld: 6 % (ref 4.6–6.5)

## 2022-10-18 LAB — TSH: TSH: 3.59 u[IU]/mL (ref 0.35–5.50)

## 2022-10-21 NOTE — Progress Notes (Signed)
Results are normal or improved and stable , no anemia  creatinine is 1.22 hg A1c is 6.0 good  and ldl cholesterol is  91 goal around 70 . Limit sweets and  animal fats.
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
## 2023-05-20 NOTE — Progress Notes (Signed)
 This encounter was created in error - please disregard.

## 2023-05-23 ENCOUNTER — Telehealth: Payer: Self-pay

## 2023-05-23 NOTE — Telephone Encounter (Signed)
 Unsuccessful attempt to reach patient on preferred number listed in notes for scheduled AWV. Left message on voicemail okay to reschedule. Patient called back @ 10:40 a and rescheduled

## 2023-07-29 DIAGNOSIS — Z5181 Encounter for therapeutic drug level monitoring: Secondary | ICD-10-CM | POA: Diagnosis not present

## 2023-07-29 DIAGNOSIS — D849 Immunodeficiency, unspecified: Secondary | ICD-10-CM | POA: Diagnosis not present

## 2023-07-29 DIAGNOSIS — R413 Other amnesia: Secondary | ICD-10-CM | POA: Diagnosis not present

## 2023-07-29 DIAGNOSIS — I152 Hypertension secondary to endocrine disorders: Secondary | ICD-10-CM | POA: Diagnosis not present

## 2023-07-29 DIAGNOSIS — B348 Other viral infections of unspecified site: Secondary | ICD-10-CM | POA: Diagnosis not present

## 2023-07-29 DIAGNOSIS — E119 Type 2 diabetes mellitus without complications: Secondary | ICD-10-CM | POA: Diagnosis not present

## 2023-07-29 DIAGNOSIS — Z79621 Long term (current) use of calcineurin inhibitor: Secondary | ICD-10-CM | POA: Diagnosis not present

## 2023-07-29 DIAGNOSIS — Z940 Kidney transplant status: Secondary | ICD-10-CM | POA: Diagnosis not present

## 2023-07-29 DIAGNOSIS — Z4822 Encounter for aftercare following kidney transplant: Secondary | ICD-10-CM | POA: Diagnosis not present

## 2023-08-28 ENCOUNTER — Encounter: Payer: Self-pay | Admitting: Internal Medicine

## 2023-08-28 ENCOUNTER — Ambulatory Visit: Admitting: Internal Medicine

## 2023-08-28 VITALS — BP 156/70 | HR 54 | Temp 98.0°F | Wt 198.4 lb

## 2023-08-28 DIAGNOSIS — Z940 Kidney transplant status: Secondary | ICD-10-CM | POA: Diagnosis not present

## 2023-08-28 DIAGNOSIS — H9193 Unspecified hearing loss, bilateral: Secondary | ICD-10-CM

## 2023-08-28 DIAGNOSIS — Z7409 Other reduced mobility: Secondary | ICD-10-CM | POA: Diagnosis not present

## 2023-08-28 DIAGNOSIS — I10 Essential (primary) hypertension: Secondary | ICD-10-CM | POA: Diagnosis not present

## 2023-08-28 DIAGNOSIS — F0390 Unspecified dementia without behavioral disturbance: Secondary | ICD-10-CM

## 2023-08-28 NOTE — Progress Notes (Signed)
 Chief Complaint  Patient presents with   Medical Management of Chronic Issues    HPI: Patient  Jeremy Black  78 y.o. comes in today with wife for update form evaluation for adult daycare memory . [facility] 4 d per week still doing well there  . Social and does a lot of owrk puzzles  and support.  No falls major change in health . Care at [facility] and hs hf renal transplant  Still sees psych at [facility]  for ptsd  Has hearing aids but not using cause doesn't like this  as hard to understand .  Spouse says uses blue tooth speaker very loud at home  [facility] with roller walker  no new sx ( has hx back surgery and right leg weakness drop)   Health Maintenance  Topic Date Due   Lung Cancer Screening  03/29/2010   Diabetic kidney evaluation - Urine ACR  09/15/2010   Pneumonia Vaccine 41+ Years old (2 of 2 - PPSV23) 03/22/2014   FOOT EXAM  01/27/2020   Colonoscopy  07/08/2020   OPHTHALMOLOGY EXAM  12/18/2021   HEMOGLOBIN A1C  04/20/2023   Medicare Annual Wellness (AWV)  05/15/2023   COVID-19 Vaccine (7 - Moderna risk 2024-25 season) 10/15/2023   Diabetic kidney evaluation - eGFR measurement  10/18/2023   INFLUENZA VACCINE  11/14/2023   DTaP/Tdap/Td (3 - Tdap) 04/20/2024   Zoster Vaccines- Shingrix  Completed   HPV VACCINES  Aged Out   Meningococcal B Vaccine  Aged Out     ROS:  GEN/ HEENT: No fever, significant weight changes sweats headaches vision problems CV/ PULM; No chest pain shortness of breath cough, syncope,edema  change in exercise tolerance. GI /GU: No adominal pain, vomiting, change in bowel habits. SKIN/HEME: ,no acute skin rashes suspicious lesions or bleeding. No lymphadenopathy, nodules, masses.  NEURO/ PSYCH:  No  new neurologic signs such as weakness numbness.  Memory dementia  on aricept  IMM/ Allergy: No unusual infections.  Allergy .   REST of 12 system review negative except as per HPI   Past Medical History:  Diagnosis Date   Allergy    Arthritis    Cataract     Diabetes mellitus without complication (HCC)    ED (erectile dysfunction)    ESRD (end stage renal disease) on dialysis (HCC) 11/11/2017   GERD (gastroesophageal reflux disease)    Hx of adenomatous colonic polyps 07/15/2017   Hyperlipidemia    Hypertension    Normal nuclear stress test    myoview  neg 12 09 except for HT   Other and unspecified alcohol dependence, unspecified drinking behavior    date of last use 02/13/11   Prostate cancer (HCC) 02/2017   stage t1c, low risk and asymptomatic followed at [facility]   Proteinuria    past record showed 500mg  protein excretion per 24 hours eval by renal   PTSD (post-traumatic stress disorder)    from [place] experience [place]   [facility]    [facility] History:  Procedure Laterality Date   ls spinal surgery     NM MYOVIEW  LTD  12/09   neg except for HT   PROSTATE BIOPSY  2018   stage t1c cancer   TRIGGER FINGER RELEASE Left 10/18/2016   Procedure: LEFT TRIGGER THUMB RELEASE;  Surgeon: Rober Chimera, MD;  Location: [facility];  Service: Orthopedics;  Laterality: Left;    Family History  Problem Relation Age of  Onset   Diabetes Mother    Hypertension Mother    Other Mother        nerves   Throat cancer Father        deceased   Coronary artery disease Sister    Diabetes Sister    Heart disease Sister    Hypertension Brother    Alcohol abuse Paternal Aunt    Alcohol abuse Cousin    Drug abuse Son        cocaine dependent    Social History   Socioeconomic History   Marital status: Legally Separated    Spouse name: Not on file   Number of children: Not on file   Years of education: Not on file   Highest education level: Not on file  Occupational History   Occupation: CAP worker with special needs adults  Tobacco Use   Smoking status: Former    Current packs/day: 0.00    Average packs/day: 0.5 packs/day for 40.0 years (20.0 ttl pk-yrs)    Types: Cigarettes     Start date: 04/15/1969    Quit date: 04/15/2009    Years since quitting: 14.3   Smokeless tobacco: Never  Vaping Use   Vaping status: Never Used  Substance and Sexual Activity   Alcohol use: Yes    Alcohol/week: 50.0 standard drinks of alcohol    Types: 50 Shots of liquor per week    Comment: Pt in treatment for alcohol dependence. 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   Working with disabled adults.    Playedgolf  Hunting in past          Social Drivers of Health   Financial Resource Strain: Low Risk  (05/14/2022)   Overall Financial Resource Strain (CARDIA)    Difficulty of Paying Living Expenses: Not hard at all  Food Insecurity: No Food Insecurity (06/03/2022)   Hunger Vital Sign    Worried About Running Out of Food in the Last Year: Never true    Ran Out of Food in the Last Year: Never true  Transportation Needs: No Transportation Needs (06/03/2022)   PRAPARE - Administrator, Civil Service (Medical): No    Lack of Transportation (Non-Medical): No  Physical Activity: Inactive (05/14/2022)   Exercise Vital Sign    Days of Exercise per Week: 0 days    Minutes of Exercise per Session: 0 min  Stress: No Stress Concern Present (05/14/2022)   [employer] of Occupational Health - Occupational Stress Questionnaire    Feeling of Stress : Not at all  Social Connections: Socially Integrated (05/14/2022)   Social Connection and Isolation Panel [NHANES]    Frequency of Communication with Friends and Family: More than three times a week    Frequency of Social  Gatherings with Friends and Family: More than three times a week    Attends Religious Services: More than 4 times per year    Active Member of  Clubs or Organizations: Yes    Attends Engineer, structural: More than 4 times per year    Marital Status: Married    Outpatient Medications Prior to Visit  Medication Sig Dispense Refill   amLODipine  (NORVASC ) 5 MG tablet Take 1 tablet by mouth daily.     aspirin (ASPIRIN 81) 81 MG chewable tablet Med on hold post-op until 06/04/22     azaTHIOprine (IMURAN) 50 MG tablet Take 3 tablets by mouth daily.     carvedilol (COREG) 3.125 MG tablet Take 3.125 mg by mouth 2 (two) times daily.     donepezil (ARICEPT) 5 MG tablet TAKE ONE TABLET BY MOUTH AS DIRECTED BY YOUR MEDICAL PROVIDER -  TAKE ONE TABLET BY MOUTH AT BEDTIME FOR 14 DAYS THEN TAKE TWO TABLETS BY  MOUTH AT BEDTIME FOR 14 DAYS THEN TAKE THREE TABLETS BY MOUTH AT BEDTIME  FOR 14 DAYS THEN TAKE FOUR TABLETS BY MOUTH AT BEDTIME -   TAKE ONE TABLET BY MOUTH AT BEDTIME FOR 14 DAYS THEN TAKE TWO TABLETS BY  MOUTH AT BEDTIME FOR 14 DAYS THEN TAKE THREE TABLETS BY MOUTH AT BEDTIME  FOR 14 DAYS THEN TAKE FOUR TABLETS BY MOUTH AT BEDTIME     folic acid -vitamin b complex-vitamin c-selenium-zinc (DIALYVITE) 3 MG TABS tablet Take 1 tablet by mouth daily.     HYDROcodone-acetaminophen  (NORCO/VICODIN) 5-325 MG tablet Take 2 tablets by mouth every 4 (four) hours as needed for moderate pain.     memantine (NAMENDA) 10 MG tablet Take by mouth. Take 1/2 tab x 2 weeks, then 1 tab x 2 weeks, then 1.5 tab x 2 weeks then 2 tab daily to continue.     mirtazapine  (REMERON  SOL-TAB) 15 MG disintegrating tablet Take 15 mg by mouth at bedtime.     Multiple Vitamin (MULTIVITAMIN) tablet Take 1 tablet by mouth daily.     omeprazole (PRILOSEC) 20 MG capsule Take 1 capsule by mouth daily.     potassium chloride (KLOR-CON) 10 MEQ tablet Take 10 mEq by mouth 2 (two) times daily.     tacrolimus  (PROGRAF ) 1 MG capsule Take by  mouth as directed.     tamsulosin (FLOMAX) 0.4 MG CAPS capsule Take 0.4 mg by mouth.     tiZANidine  (ZANAFLEX ) 4 MG tablet Take 1 tablet (4 mg total) by mouth every 6 (six) hours as needed for muscle spasms. 30 tablet 0   traZODone  (DESYREL ) 50 MG tablet Take 25 mg by mouth at bedtime as needed for sleep.      No facility-administered medications prior to visit.     EXAM:  BP (!) 156/70 (BP Location: Right Arm, Patient Position: Sitting, Cuff Size: Normal)   Pulse (!) 54   Temp 98 F (36.7 C) (Oral)   Wt 198 lb 6.4 oz (90 kg)   SpO2 95%   BMI 27.67 kg/m   Body mass index is 27.67 kg/m. Wt Readings from Last 3 Encounters:  08/28/23 198 lb 6.4 oz (90 kg)  10/16/22 205 lb 12.8 oz (93.4 kg)  05/14/22 206 lb (93.4 kg)    Physical Exam: Vital signs reviewed ZOX:WRUE is a well-developed well-nourished alert cooperative    who appearsr stated age in no acute distress. Interactive  memory eval not done but nl but sparse conversation (not too much different from baseline) HEENT: normocephalic atraumatic , Eyes: PERRL galsses no deformity discharge or tenderness., NECK: supple without masses, thyromegaly or bruits. CHEST/PULM:  Clear to auscultation and percussion breath sounds equal  no wheeze , rales or rhonchi.  CV: PMI is nondisplaced, S1 S2 no gallops, murmurs, rubs.  Extremtities:  No clubbing cyanosis or edema, no acute joint swelling or redness no focal atrophy NEURO: , cranial nerves 3-12 appear to be intact, uses roller walkder  righ leg with  ? Min foot drop gait but very steady  and easy  no tremor  SKIN: No acute rashes normal turgor, color, no bruising or petechiae. LN: no cervical adenopathy  Lab Results  Component Value Date   WBC 6.1 10/18/2022   HGB 15.4 10/18/2022   HCT 47.8 10/18/2022   PLT 259.0 10/18/2022   GLUCOSE 109 (H) 10/18/2022   CHOL 162 10/18/2022   TRIG 149.0 10/18/2022   HDL 40.80 10/18/2022   LDLDIRECT 66.7 01/31/2011   LDLCALC 91 10/18/2022    ALT 9 10/18/2022   AST 16 10/18/2022   NA 144 10/18/2022   K 3.5 10/18/2022   CL 111 10/18/2022   CREATININE 1.22 10/18/2022   BUN 9 10/18/2022   CO2 24 10/18/2022   TSH 3.59 10/18/2022   PSA 0.82 09/07/2009   HGBA1C 6.0 10/18/2022   MICROALBUR 141.5 (H) 09/14/2009    BP Readings from Last 3 Encounters:  08/28/23 (!) 156/70  10/16/22 130/70  04/16/22 (!) 154/86    Lab results  in [place] module in record Med list reviewed as well   as  [facility]  form and limitations   ASSESSMENT AND PLAN:  Discussed the following assessment and plan:    ICD-10-CM   1. Dementia, unspecified dementia severity, unspecified dementia type, unspecified whether behavioral, psychotic, or mood disturbance or anxiety (HCC)  F03.90     2. Decreased hearing of both ears  H91.93     3. S/p cadaver renal transplant  Z94.0     4. Essential hypertension  I10     5. Mobility impaired  Z74.09     [facility] and [facility]ties   fu here as appropriate. Dsic hearing and cognition and options to help esp with spouse when moise is loud.  Seems like the day program is a very positive  for him and to continue. Bp I s up some today  had [facility] team manage . Return for as indicated and yearly .  Patient Care Team: Nickalus Thornsberry, Joaquim Muir, MD as PCP - General [place], Kellie, MD (Nephrology) Psychiatrist and PCP at the [facility], MD as Consulting Physician (Neurosurgery) Gabino Joe, MD as Consulting Physician (Cardiology) [place] as Attending Physician Patient Instructions  Good seeing  you today . Will fill out form and  let you know when ready . Try a bluetooth.   Reed Dady K. Adger Cantera M.D.

## 2023-08-28 NOTE — Patient Instructions (Signed)
 Good seeing  you today . Will fill out form and  let you know when ready . Try a bluetooth.

## 2023-10-08 ENCOUNTER — Ambulatory Visit: Payer: Medicare PPO

## 2023-10-28 DIAGNOSIS — C61 Malignant neoplasm of prostate: Secondary | ICD-10-CM | POA: Diagnosis not present

## 2024-02-19 ENCOUNTER — Ambulatory Visit: Admitting: Family Medicine

## 2024-02-19 DIAGNOSIS — Z0000 Encounter for general adult medical examination without abnormal findings: Secondary | ICD-10-CM | POA: Diagnosis not present

## 2024-02-19 NOTE — Progress Notes (Signed)
 ----------------------------------------------------------------------------------------------------------------------------------------------------------------------------------------------------------------------  Because this visit was a virtual/telehealth visit, some criteria may be missing or patient reported. Any vitals not documented were not able to be obtained and vitals that have been documented are patient reported.    MEDICARE ANNUAL PREVENTIVE CARE VISIT WITH PROVIDER (Welcome to Medicare, initial annual wellness or annual wellness exam)  Virtual Visit via Video Note  I connected with Jeremy Black on 02/19/24  by  a video enabled telemedicine application and verified that I am speaking with the correct person using two identifiers.  Location patient: home Location provider:work or home office Persons participating in the virtual visit: patient, provider   Meds, PMH, Allergies, FH, SH, Care Teams, Fall risk and PHQ9 completed by nursing staff prior to visit. Reviewed.   Concerns and/or follow up today: detailed intake and health/risks assessment completed on flow sheets and below- please see for details. Reports health is stable currently.  How often do you have a drink containing alcohol? N/a How many drinks containing alcohol do you have on a typical day when you are drinking? N/a How often do you have six or more drinks on one occasion?n/a Have you ever smoked?yes Quit date if applicable? 12 years ago  How many packs a day do/did you smoke? 1  Do you use smokeless tobacco?no Do you use an illicit drugs?no Do you feel safe at home?yes  Last dentist visit?n/a  [facility]    See HM section in Epic for other details of completed HM.    ROS: negative for report of fevers, unintentional weight loss, vision changes, vision loss, hearing loss or change, chest pain, sob, hemoptysis, melena, hematochezia, hematuria, falls, bleeding or  bruising, thoughts of suicide or self harm, memory loss  Patient-completed extensive health risk assessment - reviewed and discussed with the patient: See Health Risk Assessment completed with patient prior to the visit either above or in recent phone note. This was reviewed in detailed with the patient today and appropriate recommendations, orders and referrals were placed as needed per Summary below and patient instructions.   Review of Medical History: -PMH, PSH, Family History and current specialty and care providers reviewed and updated and listed below   Patient Care Team: Panosh, Apolinar POUR, MD as PCP - General Prescilla Beams, MD (Nephrology) Psychiatrist and PCP at the [facility], Rockey, MD as Consulting Physician (Neurosurgery) otho asberry Delford Maude JAYSON, MD as Consulting Physician (Cardiology) sheila Tapp as Attending Physician   Past Medical History:  Diagnosis Date   Allergy    Arthritis    Cataract    Diabetes mellitus without complication [facility])    ED (erectile dysfunction)    ESRD (end stage renal disease) on dialysis (HCC) 11/11/2017   GERD (gastroesophageal reflux disease)    Hx of adenomatous colonic polyps 07/15/2017   Hyperlipidemia    Hypertension    Normal nuclear stress test    myoview  neg 12 09 except for HT   Other and unspecified alcohol dependence, unspecified drinking behavior    date of last use 02/13/11   Prostate cancer (HCC) 02/2017   stage t1c, low risk and asymptomatic followed at [facility]   Proteinuria    past record showed 500mg  protein excretion per 24 hours eval by renal   PTSD (post-traumatic stress disorder)    from [place] experience [place]   [place]    [facility] History:  Procedure Laterality Date   ls spinal surgery     NM MYOVIEW  LTD  12/09   neg except for HT   PROSTATE BIOPSY  2018   stage t1c cancer   TRIGGER FINGER RELEASE Left 10/18/2016   Procedure: LEFT TRIGGER THUMB  RELEASE;  Surgeon: Sebastian Lenis, MD;  Location: [facility];  Service: Orthopedics;  Laterality: Left;    Social History   Socioeconomic History   Marital status: Legally Separated    Spouse name: Not on file   Number of children: Not on file   Years of education: Not on file   Highest education level: Not on file  Occupational History   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   Working with disabled adults.    Playedgolf  Hunting in past          Social Drivers of Health   Financial Resource Strain: Low Risk  (02/19/2024)   Overall Financial Resource Strain (CARDIA)    Difficulty of Paying Living Expenses: Not hard at all  Food Insecurity: No Food Insecurity (02/19/2024)   Hunger Vital Sign    Worried About Running Out of Food in the Last Year: Never true    Ran Out of Food in the Last Year: Never true  Transportation Needs: No Transportation Needs (02/19/2024)   PRAPARE - Administrator, Civil Service  (Medical): No    Lack of Transportation (Non-Medical): No  Physical Activity: Inactive (02/19/2024)   Exercise Vital Sign    Days of Exercise per Week: 0 days    Minutes of Exercise per Session: 0 min  Stress: No Stress Concern Present (02/19/2024)   [employer] of Occupational Health - Occupational Stress Questionnaire    Feeling of Stress: Not at all  Social Connections: Unknown (02/19/2024)   Social Connection and Isolation Panel    Frequency of Communication with Friends and Family: More than three times a week    Frequency of Social Gatherings with Friends and Family: Not on file    Attends Religious Services: Not on file    Active Member of Clubs or Organizations: Not on file    Attends Banker Meetings: Not on file    Marital Status: Not on file  Intimate Partner Violence: Not  At Risk (02/19/2024)   Humiliation, Afraid, Rape, and Kick questionnaire    Fear of Current or Ex-Partner: No    Emotionally Abused: No    Physically Abused: No    Sexually Abused: No    Family History  Problem Relation Age of Onset   Diabetes Mother    Hypertension Mother    Other Mother        nerves   Throat cancer Father        deceased   Coronary artery disease Sister    Diabetes Sister    Heart disease Sister    Hypertension Brother    Alcohol abuse Paternal Aunt    Alcohol abuse Cousin    Drug abuse Son        cocaine dependent    Current Outpatient Medications on File Prior to Visit  Medication Sig Dispense Refill   amLODipine  (NORVASC ) 5 MG tablet Take 1 tablet by mouth daily.     aspirin (ASPIRIN 81) 81 MG chewable tablet Med on hold post-op until 06/04/22     azaTHIOprine (IMURAN) 50 MG tablet Take 3 tablets by mouth daily.     carvedilol (COREG) 3.125 MG tablet Take 3.125 mg by mouth 2 (two) times daily.     donepezil (ARICEPT) 5 MG tablet TAKE ONE TABLET BY MOUTH AS DIRECTED BY YOUR MEDICAL PROVIDER -  TAKE ONE TABLET BY MOUTH AT BEDTIME FOR 14 DAYS THEN TAKE  TWO TABLETS BY  MOUTH AT BEDTIME FOR 14 DAYS THEN TAKE THREE TABLETS BY MOUTH AT BEDTIME  FOR 14 DAYS THEN TAKE FOUR TABLETS BY MOUTH AT BEDTIME -   TAKE ONE TABLET BY MOUTH AT BEDTIME FOR 14 DAYS THEN TAKE TWO TABLETS BY  MOUTH AT BEDTIME FOR 14 DAYS THEN TAKE THREE TABLETS BY MOUTH AT BEDTIME  FOR 14 DAYS THEN TAKE FOUR TABLETS BY MOUTH AT BEDTIME     folic acid -vitamin b complex-vitamin c-selenium-zinc (DIALYVITE) 3 MG TABS tablet Take 1 tablet by mouth daily.     HYDROcodone-acetaminophen  (NORCO/VICODIN) 5-325 MG tablet Take 2 tablets by mouth every 4 (four) hours as needed for moderate pain.     memantine (NAMENDA) 10 MG tablet Take by mouth. Take 1/2 tab x 2 weeks, then 1 tab x 2 weeks, then 1.5 tab x 2 weeks then 2 tab daily to continue.     mirtazapine  (REMERON  SOL-TAB) 15 MG disintegrating tablet Take 15 mg by mouth at bedtime.     Multiple Vitamin (MULTIVITAMIN) tablet Take 1 tablet by mouth daily.     omeprazole (PRILOSEC) 20 MG capsule Take 1 capsule by mouth daily.     potassium chloride (KLOR-CON) 10 MEQ tablet Take 10 mEq by mouth 2 (two) times daily.     tacrolimus  (PROGRAF ) 1 MG capsule Take by mouth as directed.     tamsulosin (FLOMAX) 0.4 MG CAPS capsule Take 0.4 mg by mouth.     tiZANidine  (ZANAFLEX ) 4 MG tablet Take 1 tablet (4 mg total) by mouth every 6 (six) hours as needed for muscle spasms. 30 tablet 0   No current facility-administered medications on file prior to visit.    Allergies  Allergen Reactions   Codeine Phosphate     Pt unsure of reaction    Lisinopril Cough       Physical Exam Vitals requested from patient and listed below if patient had equipment and was able to obtain at home for this virtual visit: There were no vitals filed for this visit. Estimated  body mass index is 27.67 kg/m as calculated from the following:   Height as of 10/16/22: 5' 11 (1.803 m).   Weight as of 08/28/23: 198 lb 6.4 oz (90 kg).  EKG (optional): deferred due to virtual  visit  GENERAL: alert, oriented, no acute distress detected; full vision exam deferred due to pandemic and/or virtual encounter  HEENT: atraumatic, conjunttiva clear, no obvious abnormalities on inspection of external nose and ears  NECK: normal movements of the head and neck  LUNGS: on inspection no signs of respiratory distress, breathing rate appears normal, no obvious gross SOB, gasping or wheezing  CV: no obvious cyanosis  MS: moves all visible extremities without noticeable abnormality  PSYCH/NEURO: pleasant and cooperative, no obvious depression or anxiety, speech and thought processing grossly intact, Cognitive function grossly intact  Flowsheet Row Clinical Support from 02/19/2024 in [facility]  PHQ-9 Total Score 0        02/19/2024   10:07 AM 08/28/2023   10:43 AM 05/14/2022    9:31 AM 05/14/2022    9:30 AM 04/16/2022    5:13 PM  Depression screen PHQ 2/9  Decreased Interest 0 0 0 0 3  Down, Depressed, Hopeless 0 0 0 0 3  PHQ - 2 Score 0 0 0 0 6  Altered sleeping 0  0 0 0  Tired, decreased energy 0  0 0 3  Change in appetite 0  0 0 3  Feeling bad or failure about yourself  0  0 0 3  Trouble concentrating 0  0 0 2  Moving slowly or fidgety/restless 0  0 0 3  Suicidal thoughts 0  0 0 0  PHQ-9 Score 0   0  0  20   Difficult doing work/chores Not difficult at all         Data saved with a previous flowsheet row definition       03/28/2022    1:31 PM 05/14/2022    9:35 AM 02/19/2024   10:08 AM 02/19/2024   10:17 AM 02/19/2024   10:29 AM  Fall Risk  Falls in the past year?  0 0    Was there an injury with Fall?  0 0 0 0  Fall Risk Category Calculator  0 0    (RETIRED) Patient Fall Risk Level Low fall risk       Patient at Risk for Falls Due to  No Fall Risks No Fall Risks No Fall Risks   Fall risk Follow up  Falls prevention discussed Falls evaluation completed Falls evaluation completed      Data saved with a previous flowsheet  row definition     SUMMARY AND PLAN:  Encounter for Medicare annual wellness exam  Discussed applicable health maintenance/preventive health measures and advised and referred or ordered per patient preferences: -he declined lung cancer screening -he and wife agree to call Dr. Avram about his colonoscopy - her reports the [place] doc told him doesn't need to do until next year. Provided him recs from Dr. Avram and provided GI office number.  -discussed vaccines due and he plans to do at the [facility], agrees to let us  know when he dose so that we can update record -he plans to do labs and foot exam when he sees Dr. Theophilus Health Maintenance  Topic Date Due   Diabetic kidney evaluation - Urine ACR  09/15/2010   FOOT EXAM  01/27/2020   Colonoscopy  07/08/2020   OPHTHALMOLOGY EXAM  12/18/2021  HEMOGLOBIN A1C  04/20/2023   Diabetic kidney evaluation - eGFR measurement  10/18/2023   Influenza Vaccine  11/14/2023   COVID-19 Vaccine (7 - 2025-26 season) 12/15/2023   Lung Cancer Screening  02/18/2029 (Originally 03/29/2010)   DTaP/Tdap/Td (3 - Tdap) 04/20/2024   Medicare Annual Wellness (AWV)  02/18/2025   Pneumococcal Vaccine: 50+ Years  Completed   Zoster Vaccines- Shingrix  Completed   Meningococcal B Vaccine  Aged Out   Hepatitis B Vaccines 19-59 Average Risk  Discontinued     Education and counseling on the following was provided based on the above review of health and a plan/checklist for the patient, along with additional information discussed, was provided for the patient in the patient instructions :  -Provided counseling and plan for difficulty hearing  -Provided safe balance exercises that can be done at home to improve balance and discussed exercise guidelines for adults with include balance exercises at least 3 days per week.  See patient instructions. -Advised and counseled on a healthy lifestyle - including the importance of a healthy diet, regular physical activity, social  connections and stress management. Has good social connections and reports does not have any stress.  -Reviewed patient's current diet. Advised and counseled on a whole foods based healthy diet. A summary of a healthy diet was provided in the Patient Instructions.  -reviewed patient's current physical activity level and discussed exercise guidelines for adults. Discussed community resources and ideas for safe exercise at home to assist in meeting exercise guideline recommendations in a safe and healthy way. He declined referral for this.  -Advise yearly dental visits at minimum and regular eye exams   Follow up: see patient instructions   Patient Instructions  I really enjoyed getting to talk with you today! I am available on Tuesdays and Thursdays for virtual visits if you have any questions or concerns, or if I can be of any further assistance.   CHECKLIST FROM ANNUAL WELLNESS VISIT:  -Follow up (please call to schedule if not scheduled after visit):   -yearly for annual wellness visit with primary care office  Here is a list of your preventive care/health maintenance measures and the plan for each if any are due:  PLAN For any measures below that may be due:    1. Call Dr. Darilyn office about the past due colonoscopy: [phone]   2. Schedule in office visit for labs and foot exam - if done at the [place], please send us  a copy of the results.   3. If you get any vaccines elsewhere please let us  know so that we can update your record.   Health Maintenance  Topic Date Due   Diabetic kidney evaluation - Urine ACR  09/15/2010   FOOT EXAM  01/27/2020   Colonoscopy  07/08/2020   OPHTHALMOLOGY EXAM  12/18/2021   HEMOGLOBIN A1C  04/20/2023   Diabetic kidney evaluation - eGFR measurement  10/18/2023   Influenza Vaccine  11/14/2023   COVID-19 Vaccine (7 - 2025-26 season) 12/15/2023   Lung Cancer Screening  02/18/2029 (Originally 03/29/2010)   DTaP/Tdap/Td (3 - Tdap) 04/20/2024    Medicare Annual Wellness (AWV)  02/18/2025   Pneumococcal Vaccine: 50+ Years  Completed   Zoster Vaccines- Shingrix  Completed   Meningococcal B Vaccine  Aged Out   Hepatitis B Vaccines 19-59 Average Risk  Discontinued    -See a dentist at least yearly  -Get your eyes checked and then per your eye specialist's recommendations  -Other issues addressed today:   -  I have included below further information regarding a healthy whole foods based diet, physical activity guidelines for adults, stress management and opportunities for social connections. I hope you find this information useful.   -----------------------------------------------------------------------------------------------------------------------------------------------------------------------------------------------------------------------------------------------------------    NUTRITION: -eat real food: lots of colorful vegetables (half the plate) and fruits -5-7 servings of vegetables and fruits per day (fresh or steamed is best), exp. 2 servings of vegetables with lunch and dinner and 2 servings of fruit per day. Berries and greens such as kale and collards are great choices.  -consume on a regular basis:  fresh fruits, fresh veggies, fish, nuts, seeds, healthy oils (such as olive oil, avocado oil), whole grains (make sure for bread/pasta/crackers/etc., that the first ingredient on label contains the word whole), legumes. -can eat small amounts of dairy and lean meat (no larger than the palm of your hand), but avoid processed meats such as ham, bacon, lunch meat, etc. -drink water -try to avoid fast food and pre-packaged foods, processed meat, ultra processed foods/beverages (donuts, candy, etc.) -most experts advise limiting sodium to < 2300mg  per day, should limit further is any chronic conditions such as high blood pressure, heart disease, diabetes, etc. The American Heart Association advised that < 1500mg  is is ideal -try  to avoid foods/beverages that contain any ingredients with names you do not recognize  -try to avoid foods/beverages  with added sugar or sweeteners/sweets  -try to avoid sweet drinks (including diet drinks): soda, juice, Gatorade, sweet tea, power drinks, diet drinks -try to avoid white rice, white bread, pasta (unless whole grain)  EXERCISE GUIDELINES FOR ADULTS: -if you wish to increase your physical activity, do so gradually and with the approval of your doctor -STOP and seek medical care immediately if you have any chest pain, chest discomfort or trouble breathing when starting or increasing exercise  -move and stretch your body, legs, feet and arms when sitting for long periods -Physical activity guidelines for optimal health in adults: -get at least 150 minutes per week of moderate exercise (can talk, but not sing); this is about 20-30 minutes of sustained activity 5-7 days per week or two 10-15 minute episodes of sustained activity 5-7 days per week -do some muscle building/resistance training/strength training at least 2 days per week  -balance exercises 3+ days per week:   Stand somewhere where you have something sturdy to hold onto if you lose balance    1) lift up on toes, then back down, start with 5x per day and work up to 20x   2) stand and lift one leg straight out to the side so that foot is a few inches of the floor, start with 5x each side and work up to 20x each side   3) stand on one foot, start with 5 seconds each side and work up to 20 seconds on each side  If you need ideas or help with getting more active:  -Silver sneakers https://tools.silversneakers.com  -Walk with a Doc: Http://www.duncan-williams.com/  -try to include resistance (weight lifting/strength building) and balance exercises twice per week: or the following link for  ideas: http://castillo-powell.com/  buyducts.dk  STRESS MANAGEMENT: -can try meditating, or just sitting quietly with deep breathing while intentionally relaxing all parts of your body for 5 minutes daily -if you need further help with stress, anxiety or depression please follow up with your primary doctor or contact the wonderful folks at [facility]: [phone]  SOCIAL CONNECTIONS: -options in [place] if you wish to engage in more social and  exercise related activities:  -Silver sneakers https://tools.silversneakers.com  -Walk with a Doc: Http://www.duncan-williams.com/  -Check out the [facility] Active Adults 50+ section on the [place] of [employer] companies (hiking clubs, book clubs, cards and games, chess, exercise classes, aquatic classes and much more) - see the website for details: https://www.[place].gov/departments/parks-recreation/active-adults50  -YouTube has lots of exercise videos for different ages and abilities as well  -[facility] (a variety of indoor and outdoor inperson activities for adults). 7796208330. [address].  -Virtual Online Classes (a variety of topics): see seniorplanet.org or call [phone]  -consider volunteering at a school, hospice center, church, senior center or elsewhere            Chiquita JONELLE Cramp, DO

## 2024-02-19 NOTE — Patient Instructions (Signed)
 I really enjoyed getting to talk with you today! I am available on Tuesdays and Thursdays for virtual visits if you have any questions or concerns, or if I can be of any further assistance.   CHECKLIST FROM ANNUAL WELLNESS VISIT:  -Follow up (please call to schedule if not scheduled after visit):   -yearly for annual wellness visit with primary care office  Here is a list of your preventive care/health maintenance measures and the plan for each if any are due:  PLAN For any measures below that may be due:    1. Call Dr. Darilyn office about the past due colonoscopy: [phone]   2. Schedule in office visit for labs and foot exam - if done at the [facility], please send us  a copy of the results.   3. If you get any vaccines elsewhere please let us  know so that we can update your record.   Health Maintenance  Topic Date Due   Diabetic kidney evaluation - Urine ACR  09/15/2010   FOOT EXAM  01/27/2020   Colonoscopy  07/08/2020   OPHTHALMOLOGY EXAM  12/18/2021   HEMOGLOBIN A1C  04/20/2023   Diabetic kidney evaluation - eGFR measurement  10/18/2023   Influenza Vaccine  11/14/2023   COVID-19 Vaccine (7 - 2025-26 season) 12/15/2023   Lung Cancer Screening  02/18/2029 (Originally 03/29/2010)   DTaP/Tdap/Td (3 - Tdap) 04/20/2024   Medicare Annual Wellness (AWV)  02/18/2025   Pneumococcal Vaccine: 50+ Years  Completed   Zoster Vaccines- Shingrix  Completed   Meningococcal B Vaccine  Aged Out   Hepatitis B Vaccines 19-59 Average Risk  Discontinued    -See a dentist at least yearly  -Get your eyes checked and then per your eye specialist's recommendations  -Other issues addressed today:   -I have included below further information regarding a healthy whole foods based diet, physical activity guidelines for adults, stress management and opportunities for social connections. I hope you find this information useful.    -----------------------------------------------------------------------------------------------------------------------------------------------------------------------------------------------------------------------------------------------------------    NUTRITION: -eat real food: lots of colorful vegetables (half the plate) and fruits -5-7 servings of vegetables and fruits per day (fresh or steamed is best), exp. 2 servings of vegetables with lunch and dinner and 2 servings of fruit per day. Berries and greens such as kale and collards are great choices.  -consume on a regular basis:  fresh fruits, fresh veggies, fish, nuts, seeds, healthy oils (such as olive oil, avocado oil), whole grains (make sure for bread/pasta/crackers/etc., that the first ingredient on label contains the word whole), legumes. -can eat small amounts of dairy and lean meat (no larger than the palm of your hand), but avoid processed meats such as ham, bacon, lunch meat, etc. -drink water -try to avoid fast food and pre-packaged foods, processed meat, ultra processed foods/beverages (donuts, candy, etc.) -most experts advise limiting sodium to < 2300mg  per day, should limit further is any chronic conditions such as high blood pressure, heart disease, diabetes, etc. The American Heart Association advised that < 1500mg  is is ideal -try to avoid foods/beverages that contain any ingredients with names you do not recognize  -try to avoid foods/beverages  with added sugar or sweeteners/sweets  -try to avoid sweet drinks (including diet drinks): soda, juice, Gatorade, sweet tea, power drinks, diet drinks -try to avoid white rice, white bread, pasta (unless whole grain)  EXERCISE GUIDELINES FOR ADULTS: -if you wish to increase your physical activity, do so gradually and with the approval of your doctor -STOP and seek  medical care immediately if you have any chest pain, chest discomfort or trouble breathing when starting or  increasing exercise  -move and stretch your body, legs, feet and arms when sitting for long periods -Physical activity guidelines for optimal health in adults: -get at least 150 minutes per week of moderate exercise (can talk, but not sing); this is about 20-30 minutes of sustained activity 5-7 days per week or two 10-15 minute episodes of sustained activity 5-7 days per week -do some muscle building/resistance training/strength training at least 2 days per week  -balance exercises 3+ days per week:   Stand somewhere where you have something sturdy to hold onto if you lose balance    1) lift up on toes, then back down, start with 5x per day and work up to 20x   2) stand and lift one leg straight out to the side so that foot is a few inches of the floor, start with 5x each side and work up to 20x each side   3) stand on one foot, start with 5 seconds each side and work up to 20 seconds on each side  If you need ideas or help with getting more active:  -Silver sneakers https://tools.silversneakers.com  -Walk with a Doc: Http://www.duncan-williams.com/  -try to include resistance (weight lifting/strength building) and balance exercises twice per week: or the following link for ideas: http://castillo-powell.com/  buyducts.dk  STRESS MANAGEMENT: -can try meditating, or just sitting quietly with deep breathing while intentionally relaxing all parts of your body for 5 minutes daily -if you need further help with stress, anxiety or depression please follow up with your primary doctor or contact the wonderful folks at [facility]: [phone]  SOCIAL CONNECTIONS: -options in [place] if you wish to engage in more social and exercise related activities:  -Silver sneakers https://tools.silversneakers.com  -Walk with a Doc: Http://www.duncan-williams.com/  -Check out the [facility] Active Adults 50+  section on the [place] of [employer] (hiking clubs, book clubs, cards and games, chess, exercise classes, aquatic classes and much more) - see the website for details: https://www.[place].gov/departments/parks-recreation/active-adults50  -YouTube has lots of exercise videos for different ages and abilities as well  -[facility] (a variety of indoor and outdoor inperson activities for adults). [phone]. [address].  -Virtual Online Classes (a variety of topics): see seniorplanet.org or call [phone]  -consider volunteering at a school, hospice center, church, senior center or elsewhere
